# Patient Record
Sex: Male | Born: 1935 | Hispanic: No | Marital: Married | Smoking: Former smoker
Health system: Southern US, Community
[De-identification: ages and names within clinical notes are randomized; demographics above are authoritative.]

## PROBLEM LIST (undated history)

## (undated) DIAGNOSIS — E785 Hyperlipidemia, unspecified: Secondary | ICD-10-CM

## (undated) DIAGNOSIS — Z87442 Personal history of urinary calculi: Secondary | ICD-10-CM

## (undated) DIAGNOSIS — I10 Essential (primary) hypertension: Secondary | ICD-10-CM

## (undated) DIAGNOSIS — T884XXA Failed or difficult intubation, initial encounter: Secondary | ICD-10-CM

## (undated) DIAGNOSIS — I4891 Unspecified atrial fibrillation: Secondary | ICD-10-CM

## (undated) HISTORY — PX: CATARACT EXTRACTION, BILATERAL: SHX1313

## (undated) HISTORY — PX: TRACHEOSTOMY: SUR1362

---

## 2006-01-29 ENCOUNTER — Ambulatory Visit: Payer: Self-pay | Admitting: Internal Medicine

## 2006-02-22 ENCOUNTER — Ambulatory Visit: Payer: Self-pay | Admitting: Internal Medicine

## 2016-10-23 ENCOUNTER — Encounter (HOSPITAL_COMMUNITY)
Admission: EM | Disposition: A | Discharge status: Skilled Nursing Facility | Payer: Self-pay | Source: Home / Self Care | Attending: Neurology

## 2016-10-23 ENCOUNTER — Emergency Department (HOSPITAL_COMMUNITY): Payer: Medicare (Managed Care)

## 2016-10-23 ENCOUNTER — Inpatient Hospital Stay (HOSPITAL_COMMUNITY): Payer: Medicare (Managed Care)

## 2016-10-23 ENCOUNTER — Emergency Department (HOSPITAL_COMMUNITY): Payer: Medicare (Managed Care) | Admitting: Certified Registered Nurse Anesthetist

## 2016-10-23 ENCOUNTER — Encounter (HOSPITAL_COMMUNITY): Payer: Self-pay | Admitting: Emergency Medicine

## 2016-10-23 ENCOUNTER — Inpatient Hospital Stay (HOSPITAL_COMMUNITY)
Admission: EM | Admit: 2016-10-23 | Discharge: 2016-11-20 | DRG: 003 | Disposition: A | Discharge status: Skilled Nursing Facility | Payer: Medicare (Managed Care) | Attending: Internal Medicine | Admitting: Internal Medicine

## 2016-10-23 DIAGNOSIS — I4581 Long QT syndrome: Secondary | ICD-10-CM | POA: Diagnosis not present

## 2016-10-23 DIAGNOSIS — J95851 Ventilator associated pneumonia: Secondary | ICD-10-CM | POA: Diagnosis present

## 2016-10-23 DIAGNOSIS — R7303 Prediabetes: Secondary | ICD-10-CM | POA: Diagnosis not present

## 2016-10-23 DIAGNOSIS — R1310 Dysphagia, unspecified: Secondary | ICD-10-CM | POA: Diagnosis present

## 2016-10-23 DIAGNOSIS — J810 Acute pulmonary edema: Secondary | ICD-10-CM | POA: Diagnosis present

## 2016-10-23 DIAGNOSIS — Z781 Physical restraint status: Secondary | ICD-10-CM

## 2016-10-23 DIAGNOSIS — Z9119 Patient's noncompliance with other medical treatment and regimen: Secondary | ICD-10-CM | POA: Diagnosis not present

## 2016-10-23 DIAGNOSIS — I482 Chronic atrial fibrillation, unspecified: Secondary | ICD-10-CM

## 2016-10-23 DIAGNOSIS — Z9911 Dependence on respirator [ventilator] status: Secondary | ICD-10-CM

## 2016-10-23 DIAGNOSIS — Z01818 Encounter for other preprocedural examination: Secondary | ICD-10-CM | POA: Diagnosis not present

## 2016-10-23 DIAGNOSIS — L8990 Pressure ulcer of unspecified site, unspecified stage: Secondary | ICD-10-CM | POA: Clinically undetermined

## 2016-10-23 DIAGNOSIS — E7849 Other hyperlipidemia: Secondary | ICD-10-CM

## 2016-10-23 DIAGNOSIS — I63131 Cerebral infarction due to embolism of right carotid artery: Secondary | ICD-10-CM | POA: Diagnosis present

## 2016-10-23 DIAGNOSIS — J9601 Acute respiratory failure with hypoxia: Secondary | ICD-10-CM | POA: Diagnosis present

## 2016-10-23 DIAGNOSIS — Q251 Coarctation of aorta: Secondary | ICD-10-CM | POA: Diagnosis not present

## 2016-10-23 DIAGNOSIS — I10 Essential (primary) hypertension: Secondary | ICD-10-CM | POA: Diagnosis present

## 2016-10-23 DIAGNOSIS — D689 Coagulation defect, unspecified: Secondary | ICD-10-CM | POA: Diagnosis not present

## 2016-10-23 DIAGNOSIS — R609 Edema, unspecified: Secondary | ICD-10-CM | POA: Diagnosis not present

## 2016-10-23 DIAGNOSIS — I63311 Cerebral infarction due to thrombosis of right middle cerebral artery: Secondary | ICD-10-CM | POA: Diagnosis not present

## 2016-10-23 DIAGNOSIS — Z9289 Personal history of other medical treatment: Secondary | ICD-10-CM

## 2016-10-23 DIAGNOSIS — I6300 Cerebral infarction due to thrombosis of unspecified precerebral artery: Secondary | ICD-10-CM | POA: Diagnosis not present

## 2016-10-23 DIAGNOSIS — D62 Acute posthemorrhagic anemia: Secondary | ICD-10-CM | POA: Diagnosis present

## 2016-10-23 DIAGNOSIS — I6310 Cerebral infarction due to embolism of unspecified precerebral artery: Secondary | ICD-10-CM | POA: Diagnosis not present

## 2016-10-23 DIAGNOSIS — I6340 Cerebral infarction due to embolism of unspecified cerebral artery: Secondary | ICD-10-CM | POA: Diagnosis present

## 2016-10-23 DIAGNOSIS — R52 Pain, unspecified: Secondary | ICD-10-CM | POA: Diagnosis not present

## 2016-10-23 DIAGNOSIS — D696 Thrombocytopenia, unspecified: Secondary | ICD-10-CM | POA: Diagnosis present

## 2016-10-23 DIAGNOSIS — R1311 Dysphagia, oral phase: Secondary | ICD-10-CM | POA: Diagnosis not present

## 2016-10-23 DIAGNOSIS — Y95 Nosocomial condition: Secondary | ICD-10-CM | POA: Diagnosis not present

## 2016-10-23 DIAGNOSIS — K5900 Constipation, unspecified: Secondary | ICD-10-CM | POA: Diagnosis not present

## 2016-10-23 DIAGNOSIS — R34 Anuria and oliguria: Secondary | ICD-10-CM | POA: Diagnosis not present

## 2016-10-23 DIAGNOSIS — R001 Bradycardia, unspecified: Secondary | ICD-10-CM | POA: Diagnosis not present

## 2016-10-23 DIAGNOSIS — Z87891 Personal history of nicotine dependence: Secondary | ICD-10-CM

## 2016-10-23 DIAGNOSIS — E785 Hyperlipidemia, unspecified: Secondary | ICD-10-CM | POA: Diagnosis present

## 2016-10-23 DIAGNOSIS — Z430 Encounter for attention to tracheostomy: Secondary | ICD-10-CM | POA: Diagnosis not present

## 2016-10-23 DIAGNOSIS — Z23 Encounter for immunization: Secondary | ICD-10-CM

## 2016-10-23 DIAGNOSIS — R402414 Glasgow coma scale score 13-15, 24 hours or more after hospital admission: Secondary | ICD-10-CM | POA: Diagnosis not present

## 2016-10-23 DIAGNOSIS — J9509 Other tracheostomy complication: Secondary | ICD-10-CM

## 2016-10-23 DIAGNOSIS — E876 Hypokalemia: Secondary | ICD-10-CM | POA: Diagnosis present

## 2016-10-23 DIAGNOSIS — J189 Pneumonia, unspecified organism: Secondary | ICD-10-CM

## 2016-10-23 DIAGNOSIS — L03221 Cellulitis of neck: Secondary | ICD-10-CM | POA: Diagnosis not present

## 2016-10-23 DIAGNOSIS — Z515 Encounter for palliative care: Secondary | ICD-10-CM | POA: Diagnosis present

## 2016-10-23 DIAGNOSIS — I63011 Cerebral infarction due to thrombosis of right vertebral artery: Secondary | ICD-10-CM | POA: Diagnosis not present

## 2016-10-23 DIAGNOSIS — R9431 Abnormal electrocardiogram [ECG] [EKG]: Secondary | ICD-10-CM | POA: Diagnosis present

## 2016-10-23 DIAGNOSIS — G9340 Encephalopathy, unspecified: Secondary | ICD-10-CM | POA: Diagnosis not present

## 2016-10-23 DIAGNOSIS — Z7189 Other specified counseling: Secondary | ICD-10-CM

## 2016-10-23 DIAGNOSIS — R791 Abnormal coagulation profile: Secondary | ICD-10-CM | POA: Diagnosis not present

## 2016-10-23 DIAGNOSIS — I639 Cerebral infarction, unspecified: Secondary | ICD-10-CM

## 2016-10-23 DIAGNOSIS — R0600 Dyspnea, unspecified: Secondary | ICD-10-CM

## 2016-10-23 DIAGNOSIS — I63411 Cerebral infarction due to embolism of right middle cerebral artery: Principal | ICD-10-CM | POA: Diagnosis present

## 2016-10-23 DIAGNOSIS — I63349 Cerebral infarction due to thrombosis of unspecified cerebellar artery: Secondary | ICD-10-CM

## 2016-10-23 DIAGNOSIS — G8194 Hemiplegia, unspecified affecting left nondominant side: Secondary | ICD-10-CM | POA: Diagnosis present

## 2016-10-23 DIAGNOSIS — R509 Fever, unspecified: Secondary | ICD-10-CM

## 2016-10-23 DIAGNOSIS — R4701 Aphasia: Secondary | ICD-10-CM | POA: Diagnosis present

## 2016-10-23 DIAGNOSIS — R1313 Dysphagia, pharyngeal phase: Secondary | ICD-10-CM | POA: Diagnosis not present

## 2016-10-23 DIAGNOSIS — T45515A Adverse effect of anticoagulants, initial encounter: Secondary | ICD-10-CM | POA: Diagnosis not present

## 2016-10-23 DIAGNOSIS — J9690 Respiratory failure, unspecified, unspecified whether with hypoxia or hypercapnia: Secondary | ICD-10-CM

## 2016-10-23 DIAGNOSIS — R58 Hemorrhage, not elsewhere classified: Secondary | ICD-10-CM | POA: Diagnosis not present

## 2016-10-23 DIAGNOSIS — L03818 Cellulitis of other sites: Secondary | ICD-10-CM | POA: Diagnosis not present

## 2016-10-23 DIAGNOSIS — R29721 NIHSS score 21: Secondary | ICD-10-CM | POA: Diagnosis present

## 2016-10-23 DIAGNOSIS — Z79899 Other long term (current) drug therapy: Secondary | ICD-10-CM

## 2016-10-23 DIAGNOSIS — I63231 Cerebral infarction due to unspecified occlusion or stenosis of right carotid arteries: Secondary | ICD-10-CM | POA: Diagnosis not present

## 2016-10-23 DIAGNOSIS — L89152 Pressure ulcer of sacral region, stage 2: Secondary | ICD-10-CM | POA: Diagnosis not present

## 2016-10-23 DIAGNOSIS — E784 Other hyperlipidemia: Secondary | ICD-10-CM | POA: Diagnosis not present

## 2016-10-23 HISTORY — DX: Personal history of urinary calculi: Z87.442

## 2016-10-23 HISTORY — PX: IR GENERIC HISTORICAL: IMG1180011

## 2016-10-23 HISTORY — DX: Hyperlipidemia, unspecified: E78.5

## 2016-10-23 HISTORY — DX: Failed or difficult intubation, initial encounter: T88.4XXA

## 2016-10-23 HISTORY — PX: RADIOLOGY WITH ANESTHESIA: SHX6223

## 2016-10-23 HISTORY — DX: Essential (primary) hypertension: I10

## 2016-10-23 HISTORY — DX: Unspecified atrial fibrillation: I48.91

## 2016-10-23 LAB — BLOOD GAS, ARTERIAL
Acid-base deficit: 1.4 mmol/L (ref 0.0–2.0)
Bicarbonate: 21.5 mmol/L (ref 20.0–28.0)
Drawn by: 35849
FIO2: 50
MECHVT: 610 mL
O2 Saturation: 99.1 %
PEEP: 5 cmH2O
Patient temperature: 98.6
RATE: 18 {breaths}/min
pCO2 arterial: 28.2 mmHg — ABNORMAL LOW (ref 32.0–48.0)
pH, Arterial: 7.493 — ABNORMAL HIGH (ref 7.350–7.450)
pO2, Arterial: 174 mmHg — ABNORMAL HIGH (ref 83.0–108.0)

## 2016-10-23 LAB — COMPREHENSIVE METABOLIC PANEL
ALK PHOS: 49 U/L (ref 38–126)
ALT: 13 U/L — AB (ref 17–63)
AST: 23 U/L (ref 15–41)
Albumin: 3.6 g/dL (ref 3.5–5.0)
Anion gap: 9 (ref 5–15)
BUN: 14 mg/dL (ref 6–20)
CALCIUM: 8.8 mg/dL — AB (ref 8.9–10.3)
CO2: 23 mmol/L (ref 22–32)
CREATININE: 0.67 mg/dL (ref 0.61–1.24)
Chloride: 109 mmol/L (ref 101–111)
Glucose, Bld: 121 mg/dL — ABNORMAL HIGH (ref 65–99)
Potassium: 3.1 mmol/L — ABNORMAL LOW (ref 3.5–5.1)
Sodium: 141 mmol/L (ref 135–145)
Total Bilirubin: 0.5 mg/dL (ref 0.3–1.2)
Total Protein: 7.2 g/dL (ref 6.5–8.1)

## 2016-10-23 LAB — CBC
HEMATOCRIT: 40.1 % (ref 39.0–52.0)
HEMOGLOBIN: 13.4 g/dL (ref 13.0–17.0)
MCH: 29.1 pg (ref 26.0–34.0)
MCHC: 33.4 g/dL (ref 30.0–36.0)
MCV: 87 fL (ref 78.0–100.0)
Platelets: 114 10*3/uL — ABNORMAL LOW (ref 150–400)
RBC: 4.61 MIL/uL (ref 4.22–5.81)
RDW: 13.7 % (ref 11.5–15.5)
WBC: 6.1 10*3/uL (ref 4.0–10.5)

## 2016-10-23 LAB — MRSA PCR SCREENING: MRSA BY PCR: NEGATIVE

## 2016-10-23 LAB — I-STAT CHEM 8, ED
BUN: 18 mg/dL (ref 6–20)
CREATININE: 0.6 mg/dL — AB (ref 0.61–1.24)
Calcium, Ion: 1.03 mmol/L — ABNORMAL LOW (ref 1.15–1.40)
Chloride: 106 mmol/L (ref 101–111)
GLUCOSE: 121 mg/dL — AB (ref 65–99)
HCT: 40 % (ref 39.0–52.0)
HEMOGLOBIN: 13.6 g/dL (ref 13.0–17.0)
POTASSIUM: 3.4 mmol/L — AB (ref 3.5–5.1)
Sodium: 144 mmol/L (ref 135–145)
TCO2: 25 mmol/L (ref 0–100)

## 2016-10-23 LAB — URINALYSIS, ROUTINE W REFLEX MICROSCOPIC
Bilirubin Urine: NEGATIVE
Glucose, UA: NEGATIVE mg/dL
Hgb urine dipstick: NEGATIVE
KETONES UR: NEGATIVE mg/dL
LEUKOCYTES UA: NEGATIVE
NITRITE: NEGATIVE
PH: 7.5 (ref 5.0–8.0)
Protein, ur: NEGATIVE mg/dL
SPECIFIC GRAVITY, URINE: 1.039 — AB (ref 1.005–1.030)

## 2016-10-23 LAB — POCT I-STAT 4, (NA,K, GLUC, HGB,HCT)
GLUCOSE: 150 mg/dL — AB (ref 65–99)
Glucose, Bld: 131 mg/dL — ABNORMAL HIGH (ref 65–99)
HCT: 30 % — ABNORMAL LOW (ref 39.0–52.0)
HEMATOCRIT: 32 % — AB (ref 39.0–52.0)
HEMOGLOBIN: 10.2 g/dL — AB (ref 13.0–17.0)
Hemoglobin: 10.9 g/dL — ABNORMAL LOW (ref 13.0–17.0)
POTASSIUM: 2.6 mmol/L — AB (ref 3.5–5.1)
POTASSIUM: 3.2 mmol/L — AB (ref 3.5–5.1)
SODIUM: 145 mmol/L (ref 135–145)
Sodium: 145 mmol/L (ref 135–145)

## 2016-10-23 LAB — I-STAT TROPONIN, ED: TROPONIN I, POC: 0.01 ng/mL (ref 0.00–0.08)

## 2016-10-23 LAB — GLUCOSE, CAPILLARY
GLUCOSE-CAPILLARY: 121 mg/dL — AB (ref 65–99)
GLUCOSE-CAPILLARY: 123 mg/dL — AB (ref 65–99)
GLUCOSE-CAPILLARY: 89 mg/dL (ref 65–99)

## 2016-10-23 LAB — APTT: aPTT: 36 seconds (ref 24–36)

## 2016-10-23 LAB — RAPID URINE DRUG SCREEN, HOSP PERFORMED
Amphetamines: NOT DETECTED
BARBITURATES: NOT DETECTED
BENZODIAZEPINES: NOT DETECTED
COCAINE: NOT DETECTED
OPIATES: NOT DETECTED
TETRAHYDROCANNABINOL: NOT DETECTED

## 2016-10-23 LAB — DIFFERENTIAL
Basophils Absolute: 0.1 10*3/uL (ref 0.0–0.1)
Basophils Relative: 1 %
Eosinophils Absolute: 0.2 10*3/uL (ref 0.0–0.7)
Eosinophils Relative: 3 %
LYMPHS PCT: 29 %
Lymphs Abs: 1.8 10*3/uL (ref 0.7–4.0)
MONO ABS: 0.5 10*3/uL (ref 0.1–1.0)
MONOS PCT: 7 %
NEUTROS ABS: 3.6 10*3/uL (ref 1.7–7.7)
Neutrophils Relative %: 60 %

## 2016-10-23 LAB — CBG MONITORING, ED: Glucose-Capillary: 119 mg/dL — ABNORMAL HIGH (ref 65–99)

## 2016-10-23 LAB — PROTIME-INR
INR: 1.14
Prothrombin Time: 14.7 seconds (ref 11.4–15.2)

## 2016-10-23 LAB — TRIGLYCERIDES: Triglycerides: 141 mg/dL

## 2016-10-23 SURGERY — RADIOLOGY WITH ANESTHESIA
Anesthesia: General

## 2016-10-23 MED ORDER — FENTANYL CITRATE (PF) 100 MCG/2ML IJ SOLN
100.0000 ug | INTRAMUSCULAR | Status: AC | PRN
  Administered 2016-10-25 – 2016-10-28 (×3): 100 ug via INTRAVENOUS
  Filled 2016-10-23 (×3): qty 2

## 2016-10-23 MED ORDER — ACETAMINOPHEN 500 MG PO TABS: 1000.0000 mg | ORAL_TABLET | Freq: Four times a day (QID) | ORAL | Status: DC | PRN

## 2016-10-23 MED ORDER — SUCCINYLCHOLINE CHLORIDE 20 MG/ML IJ SOLN
INTRAMUSCULAR | Status: DC | PRN
  Administered 2016-10-23: 100 mg via INTRAVENOUS

## 2016-10-23 MED ORDER — HALOPERIDOL LACTATE 5 MG/ML IJ SOLN
1.0000 mg | Freq: Once | INTRAMUSCULAR | Status: AC
  Administered 2016-10-23: 1 mg via INTRAVENOUS

## 2016-10-23 MED ORDER — LABETALOL HCL 5 MG/ML IV SOLN
10.0000 mg | Freq: Once | INTRAVENOUS | Status: AC
  Administered 2016-10-23: 10 mg via INTRAVENOUS

## 2016-10-23 MED ORDER — SENNOSIDES-DOCUSATE SODIUM 8.6-50 MG PO TABS
1.0000 | ORAL_TABLET | Freq: Every evening | ORAL | Status: AC | PRN
  Filled 2016-10-23: qty 1

## 2016-10-23 MED ORDER — IOPAMIDOL (ISOVUE-370) INJECTION 76%
INTRAVENOUS | Status: AC
  Administered 2016-10-23: 50 mL
  Filled 2016-10-23: qty 50

## 2016-10-23 MED ORDER — LACTATED RINGERS IV SOLN
INTRAVENOUS | Status: DC | PRN
  Administered 2016-10-23: 09:00:00 via INTRAVENOUS

## 2016-10-23 MED ORDER — ORAL CARE MOUTH RINSE
15.0000 mL | Freq: Four times a day (QID) | OROMUCOSAL | Status: DC
  Administered 2016-10-23 – 2016-10-25 (×8): 15 mL via OROMUCOSAL

## 2016-10-23 MED ORDER — SODIUM CHLORIDE 0.9 % IV SOLN
INTRAVENOUS | Status: DC
  Administered 2016-10-25 – 2016-10-30 (×7): via INTRAVENOUS

## 2016-10-23 MED ORDER — ROCURONIUM BROMIDE 100 MG/10ML IV SOLN
INTRAVENOUS | Status: DC | PRN
  Administered 2016-10-23: 50 mg via INTRAVENOUS
  Administered 2016-10-23: 70 mg via INTRAVENOUS
  Administered 2016-10-23: 30 mg via INTRAVENOUS

## 2016-10-23 MED ORDER — GLYCOPYRROLATE 0.2 MG/ML IJ SOLN
INTRAMUSCULAR | Status: DC | PRN
  Administered 2016-10-23: .02 mg via INTRAVENOUS

## 2016-10-23 MED ORDER — ONDANSETRON HCL 4 MG/2ML IJ SOLN: 4.0000 mg | Freq: Four times a day (QID) | INTRAMUSCULAR | Status: DC | PRN

## 2016-10-23 MED ORDER — NICARDIPINE HCL IN NACL 20-0.86 MG/200ML-% IV SOLN: 5.0000 mg/h | INTRAVENOUS | Status: DC

## 2016-10-23 MED ORDER — STROKE: EARLY STAGES OF RECOVERY BOOK
Freq: Once | Status: AC
  Filled 2016-10-23: qty 1

## 2016-10-23 MED ORDER — FAMOTIDINE IN NACL 20-0.9 MG/50ML-% IV SOLN
20.0000 mg | INTRAVENOUS | Status: DC
  Administered 2016-10-23 – 2016-11-01 (×10): 20 mg via INTRAVENOUS
  Filled 2016-10-23 (×10): qty 50

## 2016-10-23 MED ORDER — CEFAZOLIN SODIUM-DEXTROSE 2-4 GM/100ML-% IV SOLN
INTRAVENOUS | Status: AC
  Filled 2016-10-23: qty 100

## 2016-10-23 MED ORDER — LABETALOL HCL 5 MG/ML IV SOLN
5.0000 mg | INTRAVENOUS | Status: DC | PRN
  Administered 2016-10-24: 5 mg via INTRAVENOUS
  Filled 2016-10-23: qty 4

## 2016-10-23 MED ORDER — POTASSIUM CHLORIDE 10 MEQ/100ML IV SOLN
INTRAVENOUS | Status: DC | PRN
  Administered 2016-10-23 (×2): 10 meq via INTRAVENOUS

## 2016-10-23 MED ORDER — INSULIN ASPART 100 UNIT/ML ~~LOC~~ SOLN
2.0000 [IU] | SUBCUTANEOUS | Status: DC
  Administered 2016-10-23 – 2016-10-26 (×9): 2 [IU] via SUBCUTANEOUS
  Administered 2016-10-26: 4 [IU] via SUBCUTANEOUS
  Administered 2016-10-26 (×2): 2 [IU] via SUBCUTANEOUS
  Administered 2016-10-26: 4 [IU] via SUBCUTANEOUS
  Administered 2016-10-26 – 2016-10-27 (×2): 2 [IU] via SUBCUTANEOUS
  Administered 2016-10-27: 4 [IU] via SUBCUTANEOUS
  Administered 2016-10-27: 2 [IU] via SUBCUTANEOUS
  Administered 2016-10-27 (×3): 4 [IU] via SUBCUTANEOUS
  Administered 2016-10-28: 2 [IU] via SUBCUTANEOUS
  Administered 2016-10-28 (×4): 4 [IU] via SUBCUTANEOUS
  Administered 2016-10-29: 2 [IU] via SUBCUTANEOUS
  Administered 2016-10-29 – 2016-10-30 (×7): 4 [IU] via SUBCUTANEOUS
  Administered 2016-10-30: 2 [IU] via SUBCUTANEOUS
  Administered 2016-10-31 – 2016-11-03 (×22): 4 [IU] via SUBCUTANEOUS
  Administered 2016-11-03: 2 [IU] via SUBCUTANEOUS

## 2016-10-23 MED ORDER — SODIUM CHLORIDE 0.9 % IV SOLN: INTRAVENOUS | Status: DC

## 2016-10-23 MED ORDER — PROPOFOL 1000 MG/100ML IV EMUL
0.0000 ug/kg/min | INTRAVENOUS | Status: DC
Start: 2016-10-23 — End: 2016-10-25
  Administered 2016-10-24: 30 ug/kg/min via INTRAVENOUS
  Administered 2016-10-24 – 2016-10-25 (×3): 20 ug/kg/min via INTRAVENOUS
  Filled 2016-10-23 (×2): qty 100
  Filled 2016-10-23: qty 200
  Filled 2016-10-23 (×2): qty 100

## 2016-10-23 MED ORDER — FENTANYL CITRATE (PF) 100 MCG/2ML IJ SOLN
INTRAMUSCULAR | Status: DC | PRN
  Administered 2016-10-23: 50 ug via INTRAVENOUS
  Administered 2016-10-23: 100 ug via INTRAVENOUS
  Administered 2016-10-23: 50 ug via INTRAVENOUS
  Administered 2016-10-23: 100 ug via INTRAVENOUS

## 2016-10-23 MED ORDER — EPTIFIBATIDE 20 MG/10ML IV SOLN
INTRAVENOUS | Status: AC
  Filled 2016-10-23: qty 10

## 2016-10-23 MED ORDER — ASPIRIN 325 MG PO TABS
ORAL_TABLET | ORAL | Status: AC
Start: 2016-10-23 — End: 2016-10-23
  Filled 2016-10-23: qty 2

## 2016-10-23 MED ORDER — PROPOFOL 10 MG/ML IV BOLUS
INTRAVENOUS | Status: DC | PRN
Start: 2016-10-23 — End: 2016-10-23
  Administered 2016-10-23: 140 mg via INTRAVENOUS

## 2016-10-23 MED ORDER — PROPOFOL 10 MG/ML IV BOLUS
INTRAVENOUS | Status: AC
  Filled 2016-10-23: qty 20

## 2016-10-23 MED ORDER — IOPAMIDOL (ISOVUE-300) INJECTION 61%
INTRAVENOUS | Status: AC
  Administered 2016-10-23: 75 mL
  Filled 2016-10-23: qty 150

## 2016-10-23 MED ORDER — HALOPERIDOL LACTATE 5 MG/ML IJ SOLN
INTRAMUSCULAR | Status: AC
  Filled 2016-10-23: qty 1

## 2016-10-23 MED ORDER — LORAZEPAM 2 MG/ML IJ SOLN
INTRAMUSCULAR | Status: AC
  Filled 2016-10-23: qty 1

## 2016-10-23 MED ORDER — EPTIFIBATIDE 20 MG/10ML IV SOLN
INTRAVENOUS | Status: AC | PRN
  Administered 2016-10-23 (×4): 1.8 mg via INTRAVENOUS

## 2016-10-23 MED ORDER — PHENYLEPHRINE HCL 10 MG/ML IJ SOLN
INTRAVENOUS | Status: DC | PRN
  Administered 2016-10-23: 10 ug/min via INTRAVENOUS

## 2016-10-23 MED ORDER — FENTANYL CITRATE (PF) 100 MCG/2ML IJ SOLN
INTRAMUSCULAR | Status: AC
  Filled 2016-10-23: qty 6

## 2016-10-23 MED ORDER — CEFAZOLIN SODIUM-DEXTROSE 2-3 GM-% IV SOLR
INTRAVENOUS | Status: DC | PRN
  Administered 2016-10-23 (×2): 2 g via INTRAVENOUS

## 2016-10-23 MED ORDER — LORAZEPAM 2 MG/ML IJ SOLN
1.0000 mg | Freq: Once | INTRAMUSCULAR | Status: AC
  Administered 2016-10-23: 1 mg via INTRAVENOUS

## 2016-10-23 MED ORDER — FENTANYL CITRATE (PF) 100 MCG/2ML IJ SOLN
100.0000 ug | INTRAMUSCULAR | Status: DC | PRN
  Administered 2016-10-25 – 2016-10-28 (×7): 100 ug via INTRAVENOUS
  Administered 2016-10-29: 50 ug via INTRAVENOUS
  Administered 2016-10-29 – 2016-10-31 (×7): 100 ug via INTRAVENOUS
  Filled 2016-10-23 (×15): qty 2

## 2016-10-23 MED ORDER — CHLORHEXIDINE GLUCONATE 0.12% ORAL RINSE (MEDLINE KIT)
15.0000 mL | Freq: Two times a day (BID) | OROMUCOSAL | Status: DC
  Administered 2016-10-23 – 2016-10-25 (×4): 15 mL via OROMUCOSAL

## 2016-10-23 MED ORDER — NITROGLYCERIN 1 MG/10 ML FOR IR/CATH LAB
INTRA_ARTERIAL | Status: AC
Start: 2016-10-23 — End: 2016-10-23
  Filled 2016-10-23: qty 10

## 2016-10-23 MED ORDER — CLOPIDOGREL BISULFATE 300 MG PO TABS
ORAL_TABLET | ORAL | Status: AC
  Filled 2016-10-23: qty 1

## 2016-10-23 MED ORDER — SODIUM CHLORIDE 0.9 % IV SOLN
INTRAVENOUS | Status: DC | PRN
  Administered 2016-10-23 (×2): via INTRAVENOUS

## 2016-10-23 MED ORDER — POTASSIUM CHLORIDE 20 MEQ/15ML (10%) PO SOLN
40.0000 meq | Freq: Once | ORAL | Status: AC
  Administered 2016-10-23: 40 meq via ORAL
  Filled 2016-10-23: qty 30

## 2016-10-23 MED ORDER — IOPAMIDOL (ISOVUE-370) INJECTION 76%
INTRAVENOUS | Status: AC
  Filled 2016-10-23: qty 50

## 2016-10-23 MED ORDER — ARTIFICIAL TEARS OP OINT
TOPICAL_OINTMENT | OPHTHALMIC | Status: DC | PRN
  Administered 2016-10-23: 1 via OPHTHALMIC

## 2016-10-23 MED ORDER — IOPAMIDOL (ISOVUE-300) INJECTION 61%
INTRAVENOUS | Status: AC
Start: 2016-10-23 — End: 2016-10-23
  Administered 2016-10-23: 70 mL
  Filled 2016-10-23: qty 300

## 2016-10-23 MED ORDER — SODIUM CHLORIDE 0.9 % IJ SOLN
INTRAVENOUS | Status: AC | PRN
  Administered 2016-10-23 (×3): 25 ug via INTRA_ARTERIAL

## 2016-10-23 MED ORDER — ACETAMINOPHEN 650 MG RE SUPP: 650.0000 mg | Freq: Four times a day (QID) | RECTAL | Status: DC | PRN

## 2016-10-23 NOTE — Progress Notes (Signed)
SLP Cancellation Note  Patient Details Name: Luis AbedKong Bandel MRN: [id number] DOB: 14-Apr-1926   Cancelled treatment:       Reason Eval/Treat Not Completed: Patient not medically ready. Please reconsult when pt able to participate with speech/swallow assessments.    Rocky CraftsKara E Miranda Garber MA, CCC-SLP Pager [phone] 10/23/2016, 3:04 PM

## 2016-10-23 NOTE — Anesthesia Preprocedure Evaluation (Addendum)
Anesthesia Evaluation  Patient identified by MRN, date of birth, ID band Patient confused  Preop documentation limited or incomplete due to emergent nature of procedure.  Airway Mallampati: II  TM Distance: >3 FB     Dental  (+) Edentulous Upper   Pulmonary    Pulmonary exam normal        Cardiovascular  Rate:Tachycardia     Neuro/Psych    GI/Hepatic   Endo/Other    Renal/GU      Musculoskeletal   Abdominal   Peds  Hematology   Anesthesia Other Findings   Reproductive/Obstetrics                             Anesthesia Physical Anesthesia Plan  ASA: IV and emergent  Anesthesia Plan: General   Post-op Pain Management:    Induction: Intravenous, Rapid sequence and Cricoid pressure planned  Airway Management Planned: Oral ETT  Additional Equipment:   Intra-op Plan:   Post-operative Plan: Post-operative intubation/ventilation  Informed Consent: I have reviewed the patients History and Physical, chart, labs and discussed the procedure including the risks, benefits and alternatives for the proposed anesthesia with the patient or authorized representative who has indicated his/her understanding and acceptance.   Only emergency history available and History available from chart only  Plan Discussed with: CRNA, Anesthesiologist and Surgeon  Anesthesia Plan Comments:        Anesthesia Quick Evaluation

## 2016-10-23 NOTE — Anesthesia Procedure Notes (Signed)
Procedure Name: Intubation Date/Time: 10/23/2016 7:00 AM Performed by: Julianne RiceBILOTTA, Rheda Kassab Z Pre-anesthesia Checklist: Patient identified, Emergency Drugs available, Suction available, Patient being monitored and Timeout performed Patient Re-evaluated:Patient Re-evaluated prior to inductionOxygen Delivery Method: Circle system utilized Preoxygenation: Pre-oxygenation with 100% oxygen Intubation Type: IV induction, Rapid sequence and Cricoid Pressure applied Laryngoscope Size: Glidescope Grade View: Grade I Tube type: Subglottic suction tube Tube size: 7.5 mm Number of attempts: 1 Airway Equipment and Method: Stylet and Video-laryngoscopy Placement Confirmation: ETT inserted through vocal cords under direct vision,  positive ETCO2 and breath sounds checked- equal and bilateral Secured at: 22 cm Tube secured with: Tape Dental Injury: Teeth and Oropharynx as per pre-operative assessment

## 2016-10-23 NOTE — Progress Notes (Signed)
Patient transported from PACU to room 3M04 without any apparent complications. RT will continue to monitor.

## 2016-10-23 NOTE — Progress Notes (Signed)
STROKE TEAM PROGRESS NOTE   HISTORY OF PRESENT ILLNESS (per record) Sean [name] is a non-English speaking 82 y.o. male who was last seen normal at midnight. He was found on the floor by family at 3:10 AM and was noted to be paralyzed on the left and unable to speak clearly. Code Stroke was initiated in the field and he was emergently transported to our ED. Not hypoglycemic per EMS. Per family he takes dabigatran for atrial fibrillation. They are unsure if he has HTN, but one of his medications is lisinopril. Also unsure if he has high cholesterol although he takes niacin. He has never had a stroke or a heart attack and does not have diabetes. He has a remote smoking history.    Family states that the patient missed both doses of Pradaxa yesterday and one of his doses the day before yesterday.   LSN: Midnight on 11/10 tPA Given: No, due to use of dabigatran   SUBJECTIVE (INTERVAL HISTORY) No family is at the bedside today. I met his wife and son yesterday at bedside. Pt has HTN and HLD and taking lisinopril-HCTZ and niacin at home along with pradaxa. However, he did not take pradaxa one day PTA. Son said he just refused to take that day.  Sheath removed today. He still intubated and on sedation. Left hemiparesis, especially the left UE. MRI showed right MCA moderate sized infarct. Put on ASA and heparin subq.    OBJECTIVE Temp:  [97.2 F (36.2 C)-99.8 F (37.7 C)] 99.8 F (37.7 C) (11/11 0800) Pulse Rate:  [40-134] 67 (11/11 0815) Cardiac Rhythm: Atrial fibrillation (11/10 2000) Resp:  [13-18] 18 (11/11 0815) BP: (101-162)/(43-78) 160/78 (11/11 0815) SpO2:  [97 %-100 %] 100 % (11/11 0815) Arterial Line BP: (111-183)/(49-111) 183/79 (11/11 0800) FiO2 (%):  [40 %-50 %] 40 % (11/11 0343) Weight:  [83 kg (182 lb 15.7 oz)] 83 kg (182 lb 15.7 oz) (11/10 1445)  CBC:  Recent Labs Lab 10/23/16 0345  10/23/16 1051 10/24/16 0500  WBC 6.1  --   --  6.5  NEUTROABS 3.6  --   --  4.7  HGB  13.4  < > 10.2* 9.7*  HCT 40.1  < > 30.0* 29.7*  MCV 87.0  --   --  86.3  PLT 114*  --   --  109*  < > = values in this interval not displayed.  Basic Metabolic Panel:  Recent Labs Lab 10/23/16 0345 10/23/16 0352  10/23/16 1051 10/24/16 0500  NA 141 144  < > 145 141  K 3.1* 3.4*  < > 3.2* 2.9*  CL 109 106  --   --  113*  CO2 23  --   --   --  21*  GLUCOSE 121* 121*  < > 131* 118*  BUN 14 18  --   --  8  CREATININE 0.67 0.60*  --   --  0.68  CALCIUM 8.8*  --   --   --  7.8*  MG  --   --   --   --  1.7  PHOS  --   --   --   --  2.8  < > = values in this interval not displayed.  Lipid Panel:     Component Value Date/Time   TRIG 141 10/23/2016 2111   HgbA1c: No results found for: HGBA1C Urine Drug Screen:     Component Value Date/Time   LABOPIA NONE DETECTED 10/23/2016 0628   COCAINSCRNUR NONE DETECTED  10/23/2016 0628   LABBENZ NONE DETECTED 10/23/2016 0628   AMPHETMU NONE DETECTED 10/23/2016 0628   THCU NONE DETECTED 10/23/2016 0628   LABBARB NONE DETECTED 10/23/2016 0628      IMAGING I have personally reviewed the radiological images below and agree with the radiology interpretations.  Ct Angio Head and Neck W Or Wo Contrast 10/23/2016 1. Occlusion of the right internal carotid artery just distal to the carotid bifurcation  2. Severely diminished opacification of the right middle cerebral artery, in keeping with acute right MCA infarct and the reported clinical left-sided symptoms.  3. No hemodynamically significant stenosis of the left carotid or vertebral arteries.   Ct Cerebral Perfusion W Contrast 10/23/2016 Motion during the perfusion scan compromises the obtained data. The calculated mismatch volume of 193 mL is likely unreliable and is not expected to provide accurate assessment of the ischemic penumbra.   Ct Head Code Stroke W/o Cm 10/23/2016 1. No acute intracranial hemorrhage.  2. ASPECTS is 10.  No CT evidence of acute cortical infarct.   Cerebral  Angiogram S/P Rt common carotid ,lt common caroptid anr RT vert artery angiograms,follwed by complete revascularization of occluded RT ICA,RT MCA and RT ACA with x 4 passes with solitaire 56m x 40 mm retrieval device ,x 1pass with the 064 ACE aspiration catheter and 7.2 mg of INTEGRELIN   with a TICI 2B reperfusion.  MRI Head Wo Contrast 10/23/2016 1. Areas of acute infarction in the right MCA territory involving the insula, frontal operculum, anteromedial temporal lobe, and lateral temporal parietal junction. No evidence for hemorrhagic conversion. 2. Sequelae of small chronic hemorrhagic infarcts in the right cerebellum and central distribution chronic microhemorrhage likely hypertensive in etiology. 3. Advanced chronic microvascular ischemic changes and parenchymal volume loss.  TTE pending   PHYSICAL EXAM  Temp:  [97.2 F (36.2 C)-99.8 F (37.7 C)] 99.8 F (37.7 C) (11/11 0800) Pulse Rate:  [40-134] 67 (11/11 0815) Resp:  [13-18] 18 (11/11 0815) BP: (101-162)/(43-78) 160/78 (11/11 0815) SpO2:  [97 %-100 %] 100 % (11/11 0930) Arterial Line BP: (111-183)/(49-111) 183/79 (11/11 0800) FiO2 (%):  [40 %-50 %] 40 % (11/11 0930) Weight:  [182 lb 15.7 oz (83 kg)] 182 lb 15.7 oz (83 kg) (11/10 1445)  General - Well nourished, well developed, intubated and on sedation.  Ophthalmologic - Fundi not visualized due to noncooperation.  Cardiovascular - irregularly irregular heart rate and rhythm.  Neuro - intubated, on sedation, eyes closed, not following commands. Pupil unequal sizes due to bilateral cataract surgery in the past, left pupil irregular shape, 1 mm, slightly reacting to light. Right pupil 452m round shape, sluggish to light reaction, tonic pupil, down to 3.5 mm with constant light exposure. Facial asymmetry cannot be evaluated due to ET tube. On pain stimulation, left UE 1/5, LLE and RLE mild withdrawal, RUE localizing to pain. DTR 1+, no Babinski. Sensation, coordination, and  gait not tested.    ASSESSMENT/PLAN Mr. KoElvin Mccartins a [age].o. male with history of hypertension, hyperlipidemia, and atrial fibrillation on Pradaxa presenting with left hemiplegia and speech difficulties.. Marland Kitchene did not receive IV t-PA due to anticoagulation. Interventional Radiology for cerebral angiogram and intervention as above.  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  Resultant  Intubated, left hemiparesis  MRI - acute infarction in the right MCA territory   CTA H&N - acute right MCA infarct. Occlusion of the right ICA.  2D Echo - pending  LDL - pending  HgbA1c - pending  VTE prophylaxis - subcutaneous heparin Diet NPO time specified  Pradaxa (dabigatran) twice a day prior to admission, now on ASA. Hold off anticoagulation due to moderately large right MCA infarct.  Ongoing aggressive stroke risk factor management  Therapy recommendations:  Pending (on bedrest)  Disposition:  Pending  Right ICA occlusion  Likely due to A. fib noncompliance with Pradaxa  S/p mechanical thrombectomy  BP goal < 160  Chronic A. fib  On Pradaxa at home however missing doses one day PTA  Hold off Pradaxa due to moderately large right MCA infarct  Consider resume Pradaxa in one week and no procedure scheduled  Respiratory failure  Intubated on sedation  CCM on board  Wean off ventilation as able  Hypertension  Stable  BP goal < 160 due to recanalization of right ICA to avoid hyperperfusion syndrome  Avoid hypotension  Hyperlipidemia  Home meds:  Niacin - not resumed.  LDL pending, goal < 70  Other Stroke Risk Factors  Advanced age  Other Active Problems  Hypokalemia - supplemented 3.2 -> 2.9 -> supplement   S/p bilateral cataract surgery  Anemia - 9.7 / 29.7  Thrombocytopenia - [facility] day # 1  This patient is critically ill due to  large right MCA infarct, right ICA occlusion s/p mechanical thrombectomy, A. fib, hypertension and respiratory failure and at significant risk of neurological worsening, death form recurrent infarct, hemorrhagic transformation, hypertensive urgency, heart failure and cerebral edema. This patient's care requires constant monitoring of vital signs, hemodynamics, respiratory and cardiac monitoring, review of multiple databases, neurological assessment, discussion with family, other specialists and medical decision making of high complexity. I spent 40 minutes of neurocritical care time in the care of this patient.  Rosalin Hawking, MD PhD Stroke Neurology 10/24/2016 10:36 AM   To contact Stroke Continuity provider, please refer to [email or web address] After hours, contact General Neurology

## 2016-10-23 NOTE — Progress Notes (Signed)
Report given to [facility] [place] on [id number]

## 2016-10-23 NOTE — Progress Notes (Signed)
OT Cancellation Note  Patient Details Name: Sean [name] MRN: [id number] DOB: 09-18-1926   Cancelled Treatment:    Reason Eval/Treat Not Completed: Patient not medically ready (active bedrest orders).  Gaye AlkenBailey A Elenna Spratling M.S., OTR/L Pager: [phone]  10/23/2016, 3:08 PM

## 2016-10-23 NOTE — ED Notes (Signed)
Escorted patient and family to IR, bedside report given to IR team

## 2016-10-23 NOTE — ED Triage Notes (Signed)
Pt BIB EMS. Last seen well by family at midnight when they went to bed.  Approximately 0310 pt was found on the floor next to the bed and was unable to move himself back onto the bed.  Left side affected. PT was seen by a new PCP Thursday afternoon for a checkup.  PMH unclear.

## 2016-10-23 NOTE — Progress Notes (Signed)
Pt arrived to pacu with vent, Diprovan started at 1240 at 25 mcg per anesthesa, continued care per anesthesa

## 2016-10-23 NOTE — ED Provider Notes (Signed)
[facility] DEPT Provider Note   CSN: [id number] Arrival date & time: 10/23/16  8563     History   Chief Complaint Chief Complaint  Patient presents with  . Code Stroke    HPI Sean [name] is a 82 y.o. male.  HPI Patient presents by EMS as a code stroke. He has expressive aphasia and is unable to contribute history. Level V caveat applies. Per EMS patient was last seen normal at midnight when he went to bed. He was found in the floor by family shortly before their arrival. Not moving left side with right-sided gaze deviation. History reviewed. No pertinent past medical history.  Patient Active Problem List   Diagnosis Date Noted  . Cerebral embolism with cerebral infarction 10/23/2016  . Stroke (cerebrum) ([place]) 10/23/2016    History reviewed. No pertinent surgical history.     Home Medications    Prior to Admission medications   Not on File    Family History No family history on file.  Social History Social History  Substance Use Topics  . Smoking status: Not on file  . Smokeless tobacco: Not on file  . Alcohol use Not on file     Allergies   Patient has no known allergies.   Review of Systems Review of Systems  Unable to perform ROS: Patient nonverbal     Physical Exam Updated Vital Signs BP 136/75   Pulse 77   Temp 99 F (37.2 C) (Axillary)   Resp 16   Ht 6' (1.829 m)   Wt 182 lb 15.7 oz (83 kg)   SpO2 100%   BMI 24.82 kg/m   Physical Exam  Constitutional: He appears well-developed and well-nourished. He appears distressed.  Mildly agitated  HENT:  Head: Normocephalic and atraumatic.  Mouth/Throat: Oropharynx is clear and moist.  Eyes: EOM are normal. Pupils are equal, round, and reactive to light.  Right gaze preference  Neck: Normal range of motion. Neck supple.  No appreciated meningismus  Cardiovascular: Normal rate.   Irregularly irregular  Pulmonary/Chest: Effort normal and breath sounds normal. No respiratory distress.  He has no wheezes. He has no rales.  Abdominal: Soft. Bowel sounds are normal.  Musculoskeletal: Normal range of motion. He exhibits no edema or tenderness.  Neurological: He is alert.  Following simple commands. Unintelligible words. 1/5 motor and left upper and left lower extremities. 5/5 motor in right upper and lower extremities.  Skin: Skin is warm and dry. Capillary refill takes less than 2 seconds. No rash noted. No erythema.  Psychiatric: He has a normal mood and affect. His behavior is normal.  Nursing note and vitals reviewed.    ED Treatments / Results  Labs (all labs ordered are listed, but only abnormal results are displayed) Labs Reviewed  CBC - Abnormal; Notable for the following:       Result Value   Platelets 114 (*)    All other components within normal limits  COMPREHENSIVE METABOLIC PANEL - Abnormal; Notable for the following:    Potassium 3.1 (*)    Glucose, Bld 121 (*)    Calcium 8.8 (*)    ALT 13 (*)    All other components within normal limits  URINALYSIS, ROUTINE W REFLEX MICROSCOPIC (NOT AT [facility]) - Abnormal; Notable for the following:    Specific Gravity, Urine 1.039 (*)    All other components within normal limits  BLOOD GAS, ARTERIAL - Abnormal; Notable for the following:    pH, Arterial 7.493 (*)  pCO2 arterial 28.2 (*)    pO2, Arterial 174 (*)    All other components within normal limits  GLUCOSE, CAPILLARY - Abnormal; Notable for the following:    Glucose-Capillary 123 (*)    All other components within normal limits  BASIC METABOLIC PANEL - Abnormal; Notable for the following:    Potassium 2.9 (*)    Chloride 113 (*)    CO2 21 (*)    Glucose, Bld 118 (*)    Calcium 7.8 (*)    All other components within normal limits  CBC WITH DIFFERENTIAL/PLATELET - Abnormal; Notable for the following:    RBC 3.44 (*)    Hemoglobin 9.7 (*)    HCT 29.7 (*)    Platelets 109 (*)    All other components within normal limits  GLUCOSE, CAPILLARY -  Abnormal; Notable for the following:    Glucose-Capillary 121 (*)    All other components within normal limits  GLUCOSE, CAPILLARY - Abnormal; Notable for the following:    Glucose-Capillary 100 (*)    All other components within normal limits  CBG MONITORING, ED - Abnormal; Notable for the following:    Glucose-Capillary 119 (*)    All other components within normal limits  I-STAT CHEM 8, ED - Abnormal; Notable for the following:    Potassium 3.4 (*)    Creatinine, Ser 0.60 (*)    Glucose, Bld 121 (*)    Calcium, Ion 1.03 (*)    All other components within normal limits  POCT I-STAT 4, (NA,K, GLUC, HGB,HCT) - Abnormal; Notable for the following:    Potassium 2.6 (*)    Glucose, Bld 150 (*)    HCT 32.0 (*)    Hemoglobin 10.9 (*)    All other components within normal limits  POCT I-STAT 4, (NA,K, GLUC, HGB,HCT) - Abnormal; Notable for the following:    Potassium 3.2 (*)    Glucose, Bld 131 (*)    HCT 30.0 (*)    Hemoglobin 10.2 (*)    All other components within normal limits  MRSA PCR SCREENING  PROTIME-INR  APTT  DIFFERENTIAL  RAPID URINE DRUG SCREEN, HOSP PERFORMED  TRIGLYCERIDES  MAGNESIUM  PHOSPHORUS  GLUCOSE, CAPILLARY  HEMOGLOBIN A1C  LIPID PANEL  CBC  I-STAT TROPOININ, ED    EKG  EKG Interpretation  Date/Time:  Friday October 23 2016 04:42:37 EST Ventricular Rate:  68 PR Interval:    QRS Duration: 102 QT Interval:  445 QTC Calculation: 474 R Axis:   81 Text Interpretation:  Atrial fibrillation Anterior infarct, old Nonspecific repol abnormality, lateral leads Baseline wander in lead(s) V1 Confirmed by Lita Mains  MD, Makenah Karas ([id number]) on 10/23/2016 5:02:26 AM       Radiology Ct Angio Head W Or Wo Contrast  Result Date: 10/23/2016 CLINICAL DATA:  Code stroke.  Left -sided weakness. EXAM: CT ANGIOGRAPHY HEAD AND NECK TECHNIQUE: Multidetector CT imaging of the head and neck was performed using the standard protocol during bolus administration of  intravenous contrast. Multiplanar CT image reconstructions and MIPs were obtained to evaluate the vascular anatomy. Carotid stenosis measurements (when applicable) are obtained utilizing NASCET criteria, using the distal internal carotid diameter as the denominator. CONTRAST:  50 mL Isovue 370 COMPARISON:  None. FINDINGS: CTA NECK FINDINGS Aortic arch: There is atherosclerotic calcification within the aortic arch. The proximal subclavian arteries are normal. No aneurysm or dissection of the aorta. Right carotid system: The right carotid origin is normal. There is mild atherosclerotic plaque within the right common  carotid artery. Within the distal right common carotid artery. There are multiple areas of somewhat reduced attenuation. There is central thrombus within the proximal right ICA at the bifurcation with complete occlusion of the right ICA approximately 1.3 cm above the bifurcation. The remainder of the cervical right ICA is occluded. Left carotid system: There is mild multifocal atherosclerotic calcification of the left common carotid artery. No hemodynamically significant stenosis of the left ICA. Vertebral arteries: There is atherosclerotic calcification within the V4 segments of both vertebral arteries with less than 50% stenosis. The vertebral artery origins are normal. The course and caliber of the cervical segments of the vertebral arteries are normal. Skeleton: There is no bony spinal canal stenosis. No lytic or blastic lesions. Other neck: The nasopharynx is clear. The oropharynx and hypopharynx are normal. The epiglottis is normal. The supraglottic larynx, glottis and subglottic larynx are normal. No retropharyngeal collection. The parapharyngeal spaces are preserved. The parotid and submandibular glands are normal. No sialolithiasis or salivary ductal dilatation. The thyroid gland is normal. There is no cervical lymphadenopathy. Upper chest: No pulmonary nodules or masses. No pleural effusion or  focal consolidation. Review of the MIP images confirms the above findings CTA HEAD FINDINGS There is motion degradation of the intracranial images. Anterior circulation: --Intracranial internal carotid arteries: The right ICA is occluded at the bifurcation and there is no opacification of the intracranial right ICA. There is atherosclerotic calcification of the left ICA at the skullbase. --Anterior cerebral arteries: Normal. --Middle cerebral arteries: There is severely diminished opacification of the right MCA. Lead opacification there is is probably secondary to collateral flow across the anterior communicating artery. --Posterior communicating arteries: Present on the left. Not seen on the right. Posterior circulation: --Posterior cerebral arteries: There is a fetal origin of the left PCA. Right PCA is normal. --Superior cerebellar arteries: Normal. --Basilar artery: Normal. --Anterior inferior cerebellar arteries: Normal. --Posterior inferior cerebellar arteries: Normal. Venous sinuses: As permitted by contrast timing, patent. Anatomic variants: Fetal origin of the left PCA. Delayed phase: Not performed Review of the MIP images confirms the above findings IMPRESSION: 1. Occlusion of the right internal carotid artery just distal to the carotid bifurcation 2. Severely diminished opacification of the right middle cerebral artery, in keeping with acute right MCA infarct and the reported clinical left-sided symptoms. 3. No hemodynamically significant stenosis of the left carotid or vertebral arteries. Critical Value/emergent results were called by telephone at the time of interpretation on 10/23/2016 at 4:57 am to Dr. Kerney Elbe, who verbally acknowledged these results. Electronically Signed   By: Ulyses Jarred M.D.   On: 10/23/2016 05:07   Ct Head Wo Contrast  Result Date: 10/23/2016 CLINICAL DATA:  Stroke. Post revascularization of occluded right internal carotid artery. EXAM: CT HEAD WITHOUT CONTRAST  TECHNIQUE: Contiguous axial images were obtained from the base of the skull through the vertex without intravenous contrast. COMPARISON:  CT head 10/23/2016 FINDINGS: Brain: Advanced atrophy. Chronic microvascular ischemic change in the white matter. No new area of acute infarct since the preprocedure CT Ill-defined hyperdensity in the right hippocampus and posterior limb internal capsule. Similar area of ill-defined hyperdensity in the right superior cerebellum. These may be areas of hemorrhage or contrast staining related to angiography and/or infarction. CT follow-up recommended. No shift of the midline structures. Ventricular enlargement consistent with atrophy is unchanged. Vascular: Extensive arterial calcification. Contrast filled artery is from prior angiography. Skull: Negative Sinuses/Orbits: Mucosal edema throughout the paranasal sinuses Other: None IMPRESSION: Advanced atrophy with chronic microvascular  ischemia. No new area of acute infarct identified Mild patchy areas of increased density in the right cerebellum, right hippocampus, and posterior limb internal capsule the right. These may be areas of contrast staining from angiography in areas of infarction. Hemorrhage not excluded. If hemorrhage, these areas are mild without mass-effect. Continued CT follow-up recommended. Electronically Signed   By: Franchot Gallo M.D.   On: 10/23/2016 12:35   Ct Angio Neck W And/or Wo Contrast  Result Date: 10/23/2016 CLINICAL DATA:  Code stroke.  Left -sided weakness. EXAM: CT ANGIOGRAPHY HEAD AND NECK TECHNIQUE: Multidetector CT imaging of the head and neck was performed using the standard protocol during bolus administration of intravenous contrast. Multiplanar CT image reconstructions and MIPs were obtained to evaluate the vascular anatomy. Carotid stenosis measurements (when applicable) are obtained utilizing NASCET criteria, using the distal internal carotid diameter as the denominator. CONTRAST:  50 mL  Isovue 370 COMPARISON:  None. FINDINGS: CTA NECK FINDINGS Aortic arch: There is atherosclerotic calcification within the aortic arch. The proximal subclavian arteries are normal. No aneurysm or dissection of the aorta. Right carotid system: The right carotid origin is normal. There is mild atherosclerotic plaque within the right common carotid artery. Within the distal right common carotid artery. There are multiple areas of somewhat reduced attenuation. There is central thrombus within the proximal right ICA at the bifurcation with complete occlusion of the right ICA approximately 1.3 cm above the bifurcation. The remainder of the cervical right ICA is occluded. Left carotid system: There is mild multifocal atherosclerotic calcification of the left common carotid artery. No hemodynamically significant stenosis of the left ICA. Vertebral arteries: There is atherosclerotic calcification within the V4 segments of both vertebral arteries with less than 50% stenosis. The vertebral artery origins are normal. The course and caliber of the cervical segments of the vertebral arteries are normal. Skeleton: There is no bony spinal canal stenosis. No lytic or blastic lesions. Other neck: The nasopharynx is clear. The oropharynx and hypopharynx are normal. The epiglottis is normal. The supraglottic larynx, glottis and subglottic larynx are normal. No retropharyngeal collection. The parapharyngeal spaces are preserved. The parotid and submandibular glands are normal. No sialolithiasis or salivary ductal dilatation. The thyroid gland is normal. There is no cervical lymphadenopathy. Upper chest: No pulmonary nodules or masses. No pleural effusion or focal consolidation. Review of the MIP images confirms the above findings CTA HEAD FINDINGS There is motion degradation of the intracranial images. Anterior circulation: --Intracranial internal carotid arteries: The right ICA is occluded at the bifurcation and there is no opacification  of the intracranial right ICA. There is atherosclerotic calcification of the left ICA at the skullbase. --Anterior cerebral arteries: Normal. --Middle cerebral arteries: There is severely diminished opacification of the right MCA. Lead opacification there is is probably secondary to collateral flow across the anterior communicating artery. --Posterior communicating arteries: Present on the left. Not seen on the right. Posterior circulation: --Posterior cerebral arteries: There is a fetal origin of the left PCA. Right PCA is normal. --Superior cerebellar arteries: Normal. --Basilar artery: Normal. --Anterior inferior cerebellar arteries: Normal. --Posterior inferior cerebellar arteries: Normal. Venous sinuses: As permitted by contrast timing, patent. Anatomic variants: Fetal origin of the left PCA. Delayed phase: Not performed Review of the MIP images confirms the above findings IMPRESSION: 1. Occlusion of the right internal carotid artery just distal to the carotid bifurcation 2. Severely diminished opacification of the right middle cerebral artery, in keeping with acute right MCA infarct and the reported clinical left-sided  symptoms. 3. No hemodynamically significant stenosis of the left carotid or vertebral arteries. Critical Value/emergent results were called by telephone at the time of interpretation on 10/23/2016 at 4:57 am to Dr. Kerney Elbe, who verbally acknowledged these results. Electronically Signed   By: Ulyses Jarred M.D.   On: 10/23/2016 05:07   Mr Brain Wo Contrast  Result Date: 10/23/2016 CLINICAL DATA:  82 y/o M; post revascularization of the occluded right internal carotid artery. EXAM: MRI HEAD WITHOUT CONTRAST TECHNIQUE: Multiplanar, multiecho pulse sequences of the brain and surrounding structures were obtained without intravenous contrast. COMPARISON:  None. FINDINGS: Brain: There are areas of diffusion restriction in the right MCA territory involving the right insula, frontal operculum,  lateral temporal parietal junction, and anteromedial temporal lobe with few additional scattered punctate foci compatible with acute infarct. No evidence for hemorrhagic conversion. There is additional punctate cortical focus of diffusion restriction within the left occipital lobe consistent with infarction. Foci of mixed diffusion signal with susceptibility hypointensity within the right cerebellar hemisphere have corresponding low density on CT and are likely represents sequelae of chronic hemorrhagic infarction. There are multiple additional scattered foci of susceptibility hypointensity with a predominantly central distribution likely representing hypertensive etiology. Background of advanced chronic microvascular ischemic changes, parenchymal volume loss, and small lacunar infarcts within the pons and thalamus. Vascular: Normal flow voids. Skull and upper cervical spine: Normal marrow signal. Sinuses/Orbits: Mastoid and paranasal sinus opacification and fluid within the nasopharynx is likely due to intubation. Visualized orbits are unremarkable with bilateral intra-ocular lens replacement. Minimal nonspecific optic nerve dural ectasia. Other: None. IMPRESSION: 1. Areas of acute infarction in the right MCA territory involving the insula, frontal operculum, anteromedial temporal lobe, and lateral temporal parietal junction. No evidence for hemorrhagic conversion. 2. Sequelae of small chronic hemorrhagic infarcts in the right cerebellum and central distribution chronic microhemorrhage likely hypertensive in etiology. 3. Advanced chronic microvascular ischemic changes and parenchymal volume loss. Electronically Signed   By: Kristine Garbe M.D.   On: 10/23/2016 23:04   Ct Cerebral Perfusion W Contrast  Result Date: 10/23/2016 CLINICAL DATA:  Stroke.  Left-sided symptoms. EXAM: CT CEREBRAL PERFUSION WITH CONTRAST TECHNIQUE: Axial CT images of the head were acquired following the administration of IV  contrast. Using a separate software package, perfusion data was created. CONTRAST:  50 mL Isovue 370 IV COMPARISON:  CTA head and neck 10/23/2016 FINDINGS: The examination is compromised by patient motion. Perfusion data maps show a increased Tmax in the right MCA distribution, which fits with the clinical picture and the findings of the CTA. However, there are also elevated Tmax values in the left hemisphere and posterior fossa, which are likely artifactual. The calculated mismatch volume of 193 mL is likely unreliable. IMPRESSION: Motion during the perfusion scan compromises the obtained data. The calculated mismatch volume of 193 mL is likely unreliable and is not expected to provide accurate assessment of the ischemic penumbra. Electronically Signed   By: Ulyses Jarred M.D.   On: 10/23/2016 06:11   Portable Chest Xray  Result Date: 10/23/2016 CLINICAL DATA:  Hypoxia EXAM: PORTABLE CHEST 1 VIEW COMPARISON:  January 29, 2006 FINDINGS: Endotracheal tube tip is 5.2 cm above the carina. Nasogastric tube tip and side port are below the diaphragm. There is no evident pneumothorax. There is no edema or consolidation. Heart is upper normal in size with pulmonary vascular within normal limits. No adenopathy. There is atherosclerotic calcification in the aorta. IMPRESSION: Tube positions as described without pneumothorax. No edema or consolidation.  Stable cardiac silhouette. There is aortic atherosclerosis. Electronically Signed   By: Lowella Grip III M.D.   On: 10/23/2016 15:22   Ct Head Code Stroke W/o Cm  Result Date: 10/23/2016 CLINICAL DATA:  Code stroke.  Left-sided weakness EXAM: CT HEAD WITHOUT CONTRAST TECHNIQUE: Contiguous axial images were obtained from the base of the skull through the vertex without intravenous contrast. COMPARISON:  None. FINDINGS: Brain: No mass lesion, intraparenchymal hemorrhage or extra-axial collection. No evidence of acute infarct. No midline shift or hydrocephalus.  There is periventricular hypoattenuation compatible with chronic microvascular disease. There is diffuse cerebral atrophy without lobar predominance. Vascular: There is atherosclerotic calcification of the carotid and vertebral arteries at the skullbase. Skull: No skull fracture or focal calvarial lesion. Visualized skull base is normal. Sinuses/Orbits: The visualized portions of the paranasal sinuses and mastoid air cells are free of fluid. No advanced mucosal thickening. The visualized orbits are normal. Other: None ASPECTS ([place] Stroke Program Early CT Score) - Ganglionic level infarction (caudate, lentiform nuclei, internal capsule, insula, M1-M3 cortex): 7 - Supraganglionic infarction (M4-M6 cortex): 3 Total score (0-10 with 10 being normal): 10 IMPRESSION: 1. No acute intracranial hemorrhage. 2. ASPECTS is 10.  No CT evidence of acute cortical infarct. These results were called by telephone at the time of interpretation on 10/23/2016 at 4:10 am to Dr. Jeanene Erb, who verbally acknowledged these results. Electronically Signed   By: Ulyses Jarred M.D.   On: 10/23/2016 04:10    Procedures Procedures (including critical care time)  Medications Ordered in ED Medications  iopamidol (ISOVUE-370) 76 % injection (  Canceled Entry 10/23/16 2230)  0.9 %  sodium chloride infusion (75 mL/hr Intravenous Rate/Dose Change 10/23/16 2229)  senna-docusate (Senokot-S) tablet 1 tablet (not administered)  acetaminophen (TYLENOL) tablet 1,000 mg (not administered)    Or  acetaminophen (TYLENOL) suppository 650 mg (not administered)  ondansetron (ZOFRAN) injection 4 mg (not administered)  nicardipine (CARDENE) 75m in 0.86% saline 2070mIV infusion (0.1 mg/ml) (0 mg/hr Intravenous Hold 10/23/16 1430)  chlorhexidine gluconate (MEDLINE KIT) (PERIDEX) 0.12 % solution 15 mL (15 mLs Mouth Rinse Given 10/23/16 2055)  MEDLINE mouth rinse (15 mLs Mouth Rinse Given 10/24/16 0445)  fentaNYL (SUBLIMAZE) injection 100 mcg  (not administered)  fentaNYL (SUBLIMAZE) injection 100 mcg (not administered)  propofol (DIPRIVAN) 1000 MG/100ML infusion (20 mcg/kg/min  84.4 kg Intravenous Rate/Dose Change 10/24/16 0404)  insulin aspart (novoLOG) injection 2-6 Units (2 Units Subcutaneous Not Given 10/24/16 0533)  famotidine (PEPCID) IVPB 20 mg premix (20 mg Intravenous Given 10/23/16 1805)  labetalol (NORMODYNE,TRANDATE) injection 5 mg (not administered)   stroke: mapping our early stages of recovery book (not administered)  iopamidol (ISOVUE-370) 76 % injection (  Canceled Entry 10/23/16 2231)  LORazepam (ATIVAN) injection 1 mg (0 mg Intravenous Duplicate 1167/67/2029470 iopamidol (ISOVUE-370) 76 % injection (50 mLs  Contrast Given 10/23/16 0500)  labetalol (NORMODYNE,TRANDATE) injection 10 mg (10 mg Intravenous Given 10/23/16 0603)  haloperidol lactate (HALDOL) injection 1 mg ( Intravenous Duplicate 1196/28/3626294 iopamidol (ISOVUE-300) 61 % injection (70 mLs  Contrast Given 10/23/16 1135)  nitroGLYCERIN 100 MCG/ML intra-arterial injection (  Duplicate 1176/54/6520354 ceFAZolin (ANCEF) 2-4 GMSF/681EX-%VPB (  Duplicate 1151/70/0127494 aspirin 325 MG tablet (  Duplicate 1149/67/5921638 clopidogrel (PLAVIX) 300 MG tablet (  Duplicate 1146/65/9923570  stroke: mapping our early stages of recovery book ( Does not apply Duplicate 1117/79/3920300 iopamidol (ISOVUE-300) 61 % injection (75 mLs  Contrast Given  10/23/16 1134)  eptifibatide (INTEGRILIN) injection (1.8 mg Intravenous Given 10/23/16 0917)  iopamidol (ISOVUE-300) 61 % injection (75 mLs  Contrast Given 10/23/16 1132)  ceFAZolin (ANCEF) 2-4 VG/715NB-% IVPB (  Duplicate 39/67/28 9791)  nitroGLYCERIN 25 mcg in sodium chloride 0.9 % 59.71 mL (25 mcg/mL) syringe (25 mcg Intra-arterial Given 10/23/16 1114)  potassium chloride 20 MEQ/15ML (10%) solution 40 mEq (40 mEq Oral Given 10/23/16 1805)  sodium chloride 0.9 % bolus 500 mL (500 mLs Intravenous Given 10/24/16 0246)      Initial Impression / Assessment and Plan / ED Course  I have reviewed the triage vital signs and the nursing notes.  Pertinent labs & imaging results that were available during my care of the patient were reviewed by me and considered in my medical decision making (see chart for details).  Clinical Course    Neurologist discussed with interventional radiologist regarding thrombectomy.  Will take to IR suite. Final Clinical Impressions(s) / ED Diagnoses   Final diagnoses:  Stroke (cerebrum) ([place])  Stroke [facility])  Cerebrovascular accident (CVA) due to embolism of right carotid artery ([place])  Cerebrovascular accident (CVA), unspecified mechanism ([place])  Coarctation of aorta, recurrent, post-intervention  Encounter for intubation    New Prescriptions There are no discharge medications for this patient.    Julianne Rice, MD 10/24/16 [phone]

## 2016-10-23 NOTE — Procedures (Signed)
S/P Rt common carotid ,lt common caroptid anr RT vert artery angiograms,follwed by complete revascularization of occluded RT ICA,RT MCA and RT ACA with x 4 passes with solitaire 4mm x 40 mm retrieval device ,x 1pass with the 064 ACE aspiration catheter and 7.2 mg of INTEGRELIN   with a TICI 2B reperfusion.

## 2016-10-23 NOTE — Sedation Documentation (Signed)
Pt transferred to PACU. [facility]. Right groin dressing dry and intact.

## 2016-10-23 NOTE — Progress Notes (Signed)
[name] is a 82 yo male, presents to ED via [facility]. Pt LKW at midnight by family. Found on the floor at 0310 by family, noted to have Left side paralysis and unable to speak clearly. NIHSS 21, CBG 121. Pt transported to IR 0650, report given to Malen GauzeEvelyn IR RN

## 2016-10-23 NOTE — Anesthesia Postprocedure Evaluation (Signed)
Anesthesia Post Note  Patient: Sean [name]  Procedure(s) Performed: Procedure(s) (LRB): RADIOLOGY WITH ANESTHESIA (N/A)  Patient location during evaluation: SICU Anesthesia Type: General Level of consciousness: sedated Pain management: pain level controlled Vital Signs Assessment: post-procedure vital signs reviewed and stable Respiratory status: patient remains intubated per anesthesia plan Cardiovascular status: stable Anesthetic complications: no    Last Vitals:  Vitals:   10/23/16 1315 10/23/16 1337  BP: 111/68   Pulse: 61 (!) 56  Resp: 18 18  Temp:      Last Pain:  Vitals:   10/23/16 0441  TempSrc: Axillary                 Sean [name] DANIEL

## 2016-10-23 NOTE — Consult Note (Addendum)
Referring Physician: Dr. Ranae PalmsYelverton    Chief Complaint: Acute onset of left hemiplegia and aphasia  HPI: Sean [name] is a non-English speaking 82 y.o. male who was last seen normal at midnight. He was found on the floor by family at 3:10 AM and was noted to be paralyzed on the left and unable to speak clearly. Code Stroke was initiated in the field and he was emergently transported to our ED. Not hypoglycemic per EMS. Per family he takes dabigatran for atrial fibrillation. They are unsure if he has HTN, but one of his medications is lisinopril. Also unsure if he has high cholesterol although he takes niacin. He has never had a stroke or a heart attack and does not have diabetes. He has a remote smoking history.    Family states that the patient missed both doses of Pradaxa yesterday and one of his doses the day before yesterday.   LSN: Midnight on 11/10 tPA Given: No, due to use of dabigatran  No past medical history on file.  No past surgical history on file.  No family history on file. Social History:  has no tobacco, alcohol, and drug history on file.  Allergies: Allergies not on file  Medications:  Pradaxa Lisinopril  Niacin  ROS: Unable to obtain due to language barrier in conjunction with aphasia.   Physical Examination:  There were no vitals taken for this visit.  Neurologic Examination: Ment: Agitated, dense expressive and receptive aphasia, dysarthric.  CN: PERRL. Left visual field cut. Forced eye deviation to right can be partially overcome with oculocephalic maneuver. Decreased sensation to left side of face. Left facial droop. Head rotates preferentially to right.  Motor: Left hemiplegia 2-3/5 upper and lower extremity. RUE and RLE 5/5. Sensory: Hypoesthesia to LUE and LLE.  Reflexes: 1+ bilateral upper extremities and patellae. 0 ankle reflexes. Right toe downgoing, left toe upgoing.  Cerebellar: No ataxia disproportionate to weakness.  Gait: Unable to ambulate due to  left hemiplegia.   Results for orders placed or performed during the hospital encounter of 10/23/16 (from the past 48 hour(s))  CBC     Status: None (Preliminary result)   Collection Time: 10/23/16  3:45 AM  Result Value Ref Range   WBC 6.1 4.0 - 10.5 K/uL   RBC 4.61 4.22 - 5.81 MIL/uL   Hemoglobin 13.4 13.0 - 17.0 g/dL   HCT 21.340.1 08.639.0 - 57.852.0 %   MCV 87.0 78.0 - 100.0 fL   MCH 29.1 26.0 - 34.0 pg   MCHC 33.4 30.0 - 36.0 g/dL   RDW 46.913.7 62.911.5 - 52.815.5 %   Platelets PENDING 150 - 400 K/uL  Differential     Status: None   Collection Time: 10/23/16  3:45 AM  Result Value Ref Range   Neutrophils Relative % 60 %   Neutro Abs 3.6 1.7 - 7.7 K/uL   Lymphocytes Relative 29 %   Lymphs Abs 1.8 0.7 - 4.0 K/uL   Monocytes Relative 7 %   Monocytes Absolute 0.5 0.1 - 1.0 K/uL   Eosinophils Relative 3 %   Eosinophils Absolute 0.2 0.0 - 0.7 K/uL   Basophils Relative 1 %   Basophils Absolute 0.1 0.0 - 0.1 K/uL  CBG monitoring, ED     Status: Abnormal   Collection Time: 10/23/16  3:46 AM  Result Value Ref Range   Glucose-Capillary 119 (H) 65 - 99 mg/dL  I-stat troponin, ED     Status: None   Collection Time: 10/23/16  3:49 AM  Result Value Ref Range   Troponin i, poc 0.01 0.00 - 0.08 ng/mL   Comment 3            Comment: Due to the release kinetics of cTnI, a negative result within the first hours of the onset of symptoms does not rule out myocardial infarction with certainty. If myocardial infarction is still suspected, repeat the test at appropriate intervals.   I-Stat Chem 8, ED     Status: Abnormal   Collection Time: 10/23/16  3:52 AM  Result Value Ref Range   Sodium 144 135 - 145 mmol/L   Potassium 3.4 (L) 3.5 - 5.1 mmol/L   Chloride 106 101 - 111 mmol/L   BUN 18 6 - 20 mg/dL   Creatinine, Ser 7.840.60 (L) 0.61 - 1.24 mg/dL   Glucose, Bld 696121 (H) 65 - 99 mg/dL   Calcium, Ion 2.951.03 (L) 1.15 - 1.40 mmol/L   TCO2 25 0 - 100 mmol/L   Hemoglobin 13.6 13.0 - 17.0 g/dL   HCT 28.440.0 13.239.0 -  44.052.0 %   No results found.  Assessment: 82 y.o. male with acute onset of aphasia and left hemiplegia.  1. Not a candidate for tPA as taking an oral anticoagulant for atrial fibrillation.  2. No hemorrhage on CT head. 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 Perfusion study showed mismatch.  3. Based upon the above, he was deemed to be a candidate for endovascular procedure to recanalize right ICA.  4. Discussed risks/benefits with family including 10% risk of hemorrhage from procedure. Family made decision to proceed with endovascular treatment.   Stroke Risk Factors - HTN, atrial fibrillation.  Plan: 1. HgbA1c, fasting lipid panel 2. MRI of brain without contrast 3. PT consult, OT consult, Speech consult 4. Echocardiogram 5. Post-endovascular procedure admission to MICU with frequent neuro checks and BP management.  6. Risk factor modification 7. Telemetry monitoring 8. Also review Dr. Berenice Boutonevashwar's procedure note or call him to ensure that specific additional post-endovascular orders are implemented. 9. Will need to start a statin prior to discharge.  10. Will need to restart dabigatran in 24 hours if CT head 24 hours following endovascular procedure is negative for hemorrhage.  11. Family has stated that they want everything to be done for the patient medically. He is full code.    @Electronically  signed: Dr. Caryl PinaEric Temika Sutphin@   @strokeconsult @ 10/23/2016, 4:05 AM

## 2016-10-23 NOTE — Consult Note (Signed)
PULMONARY / CRITICAL CARE MEDICINE   Name: Sean AbedKong [name] MRN: [id number] DOB: 1926-01-07    ADMISSION DATE:  10/23/2016 CONSULTATION DATE:  10/23/16  REFERRING MD: Dr. Ranae PalmsYelverton   CHIEF COMPLAINT: Acute onset of left hemiplegia and aphasia  HISTORY OF PRESENT ILLNESS:  Information obtained from staff and prior notes as patient is intubated and sedated.  Sean PufferKong Truddie [name] is a non-English speaking 82 y.o. male who was last seen normal at midnight on 11/10. He was found on the floor by family at 3:10 AM and was noted to be paralyzed on the left and unable to speak clearly. Code Stroke was initiated in the field and he was emergently transported ED. Per EMS was not hypoglycemic.On arrival to ED patient was found to be agitated and aphasic. 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 Patient was then taken to IR for recanalization of the right ICA.  Per family he takes dabigatran for atrial fibrillation. They are unsure if he has HTN, but one of his medications is lisinopril. Also unsure if he has high cholesterol although he takes niacin. He has never had a stroke or a heart attack and does not have diabetes. He has a remote smoking history.   PAST MEDICAL HISTORY :  He  has no past medical history on file.  PAST SURGICAL HISTORY: He  has no past surgical history on file.  No Known Allergies  No current facility-administered medications on file prior to encounter.    No current outpatient prescriptions on file prior to encounter.    FAMILY HISTORY:  His has no family status information on file.    SOCIAL HISTORY: He    REVIEW OF SYSTEMS:   Unable to obtain.   SUBJECTIVE:  RN reports patient has been minimally responsive since arrival from IR   VITAL SIGNS: BP 123/64   Pulse (!) 52   Temp 97.2 F (36.2 C)   Resp 18   Wt 84.4 kg (186 lb)   SpO2 100%   HEMODYNAMICS:     VENTILATOR SETTINGS: Vent Mode: PRVC FiO2 (%):  [50 %] 50 % Set Rate:  [18 bmp] 18 bmp Vt Set:  [610 mL] 610 mL PEEP:  [5 cmH20] 5 cmH20 Plateau Pressure:  [18 cmH20] 18 cmH20  INTAKE / OUTPUT: No intake/output data recorded.  PHYSICAL EXAMINATION: General: Elderly male in bed sedated on vent Neuro: sedated, responses to physical stimulation Left hemiplegia, withdrawals on right upper and lower extremities, 3mm pupils sluggish response, negative gag  HEENT: NAD Cardiovascular: s1/s3, no MRG, afib  Lungs: clear, diminished bilaterally  Abdomen: non tender, non distended, active bowel sounds  Musculoskeletal: no acute deformities.  Skin: Warm, dry, intact    LABS:  BMET  Recent Labs Lab 10/23/16 0345 10/23/16 0352 10/23/16 0917 10/23/16 1051  NA 141 144 145 145  K 3.1* 3.4* 2.6* 3.2*  CL 109 106  --   --   CO2 23  --   --   --   BUN 14 18  --   --   CREATININE 0.67 0.60*  --   --   GLUCOSE 121* 121* 150* 131*    Electrolytes  Recent Labs Lab 10/23/16 0345  CALCIUM 8.8*    CBC  Recent Labs Lab 10/23/16 0345 10/23/16 0352 10/23/16 0917 10/23/16 1051  WBC 6.1  --   --   --   HGB 13.4 13.6 10.9*  10.2*  HCT 40.1 40.0 32.0* 30.0*  PLT 114*  --   --   --     Coag's  Recent Labs Lab 10/23/16 0345  APTT 36  INR 1.14    Sepsis Markers No results for input(s): LATICACIDVEN, PROCALCITON, O2SATVEN in the last 168 hours.  ABG No results for input(s): PHART, PCO2ART, PO2ART in the last 168 hours.  Liver Enzymes  Recent Labs Lab 10/23/16 0345  AST 23  ALT 13*  ALKPHOS 49  BILITOT 0.5  ALBUMIN 3.6    Cardiac Enzymes No results for input(s): TROPONINI, PROBNP in the last 168 hours.  Glucose  Recent Labs Lab 10/23/16 0346  GLUCAP 119*    Imaging Ct Angio Head W Or Wo Contrast  Result Date: 10/23/2016 CLINICAL DATA:  Code stroke.  Left -sided weakness. EXAM: CT ANGIOGRAPHY HEAD AND NECK TECHNIQUE: Multidetector CT imaging of the  head and neck was performed using the standard protocol during bolus administration of intravenous contrast. Multiplanar CT image reconstructions and MIPs were obtained to evaluate the vascular anatomy. Carotid stenosis measurements (when applicable) are obtained utilizing NASCET criteria, using the distal internal carotid diameter as the denominator. CONTRAST:  50 mL Isovue 370 COMPARISON:  None. FINDINGS: CTA NECK FINDINGS Aortic arch: There is atherosclerotic calcification within the aortic arch. The proximal subclavian arteries are normal. No aneurysm or dissection of the aorta. Right carotid system: The right carotid origin is normal. There is mild atherosclerotic plaque within the right common carotid artery. Within the distal right common carotid artery. There are multiple areas of somewhat reduced attenuation. There is central thrombus within the proximal right ICA at the bifurcation with complete occlusion of the right ICA approximately 1.3 cm above the bifurcation. The remainder of the cervical right ICA is occluded. Left carotid system: There is mild multifocal atherosclerotic calcification of the left common carotid artery. No hemodynamically significant stenosis of the left ICA. Vertebral arteries: There is atherosclerotic calcification within the V4 segments of both vertebral arteries with less than 50% stenosis. The vertebral artery origins are normal. The course and caliber of the cervical segments of the vertebral arteries are normal. Skeleton: There is no bony spinal canal stenosis. No lytic or blastic lesions. Other neck: The nasopharynx is clear. The oropharynx and hypopharynx are normal. The epiglottis is normal. The supraglottic larynx, glottis and subglottic larynx are normal. No retropharyngeal collection. The parapharyngeal spaces are preserved. The parotid and submandibular glands are normal. No sialolithiasis or salivary ductal dilatation. The thyroid gland is normal. There is no cervical  lymphadenopathy. Upper chest: No pulmonary nodules or masses. No pleural effusion or focal consolidation. Review of the MIP images confirms the above findings CTA HEAD FINDINGS There is motion degradation of the intracranial images. Anterior circulation: --Intracranial internal carotid arteries: The right ICA is occluded at the bifurcation and there is no opacification of the intracranial right ICA. There is atherosclerotic calcification of the left ICA at the skullbase. --Anterior cerebral arteries: Normal. --Middle cerebral arteries: There is severely diminished opacification of the right MCA. Lead opacification there is is probably secondary to collateral flow across the anterior communicating artery. --Posterior communicating arteries: Present on the left. Not seen on the right. Posterior circulation: --Posterior cerebral arteries: There is a fetal origin of the left PCA. Right PCA is normal. --Superior cerebellar arteries: Normal. --Basilar artery: Normal. --Anterior inferior cerebellar arteries: Normal. --Posterior inferior cerebellar arteries: Normal. Venous sinuses: As permitted by contrast timing, patent. Anatomic variants: Fetal origin of the left PCA.  Delayed phase: Not performed Review of the MIP images confirms the above findings IMPRESSION: 1. Occlusion of the right internal carotid artery just distal to the carotid bifurcation 2. Severely diminished opacification of the right middle cerebral artery, in keeping with acute right MCA infarct and the reported clinical left-sided symptoms. 3. No hemodynamically significant stenosis of the left carotid or vertebral arteries. Critical Value/emergent results were called by telephone at the time of interpretation on 10/23/2016 at 4:57 am to Dr. Caryl Pina, who verbally acknowledged these results. Electronically Signed   By: Deatra Robinson M.D.   On: 10/23/2016 05:07   Ct Head Wo Contrast  Result Date: 10/23/2016 CLINICAL DATA:  Stroke. Post  revascularization of occluded right internal carotid artery. EXAM: CT HEAD WITHOUT CONTRAST TECHNIQUE: Contiguous axial images were obtained from the base of the skull through the vertex without intravenous contrast. COMPARISON:  CT head 10/23/2016 FINDINGS: Brain: Advanced atrophy. Chronic microvascular ischemic change in the white matter. No new area of acute infarct since the preprocedure CT Ill-defined hyperdensity in the right hippocampus and posterior limb internal capsule. Similar area of ill-defined hyperdensity in the right superior cerebellum. These may be areas of hemorrhage or contrast staining related to angiography and/or infarction. CT follow-up recommended. No shift of the midline structures. Ventricular enlargement consistent with atrophy is unchanged. Vascular: Extensive arterial calcification. Contrast filled artery is from prior angiography. Skull: Negative Sinuses/Orbits: Mucosal edema throughout the paranasal sinuses Other: None IMPRESSION: Advanced atrophy with chronic microvascular ischemia. No new area of acute infarct identified Mild patchy areas of increased density in the right cerebellum, right hippocampus, and posterior limb internal capsule the right. These may be areas of contrast staining from angiography in areas of infarction. Hemorrhage not excluded. If hemorrhage, these areas are mild without mass-effect. Continued CT follow-up recommended. Electronically Signed   By: Marlan Palau M.D.   On: 10/23/2016 12:35   Ct Angio Neck W And/or Wo Contrast  Result Date: 10/23/2016 CLINICAL DATA:  Code stroke.  Left -sided weakness. EXAM: CT ANGIOGRAPHY HEAD AND NECK TECHNIQUE: Multidetector CT imaging of the head and neck was performed using the standard protocol during bolus administration of intravenous contrast. Multiplanar CT image reconstructions and MIPs were obtained to evaluate the vascular anatomy. Carotid stenosis measurements (when applicable) are obtained utilizing NASCET  criteria, using the distal internal carotid diameter as the denominator. CONTRAST:  50 mL Isovue 370 COMPARISON:  None. FINDINGS: CTA NECK FINDINGS Aortic arch: There is atherosclerotic calcification within the aortic arch. The proximal subclavian arteries are normal. No aneurysm or dissection of the aorta. Right carotid system: The right carotid origin is normal. There is mild atherosclerotic plaque within the right common carotid artery. Within the distal right common carotid artery. There are multiple areas of somewhat reduced attenuation. There is central thrombus within the proximal right ICA at the bifurcation with complete occlusion of the right ICA approximately 1.3 cm above the bifurcation. The remainder of the cervical right ICA is occluded. Left carotid system: There is mild multifocal atherosclerotic calcification of the left common carotid artery. No hemodynamically significant stenosis of the left ICA. Vertebral arteries: There is atherosclerotic calcification within the V4 segments of both vertebral arteries with less than 50% stenosis. The vertebral artery origins are normal. The course and caliber of the cervical segments of the vertebral arteries are normal. Skeleton: There is no bony spinal canal stenosis. No lytic or blastic lesions. Other neck: The nasopharynx is clear. The oropharynx and hypopharynx are normal. The epiglottis  is normal. The supraglottic larynx, glottis and subglottic larynx are normal. No retropharyngeal collection. The parapharyngeal spaces are preserved. The parotid and submandibular glands are normal. No sialolithiasis or salivary ductal dilatation. The thyroid gland is normal. There is no cervical lymphadenopathy. Upper chest: No pulmonary nodules or masses. No pleural effusion or focal consolidation. Review of the MIP images confirms the above findings CTA HEAD FINDINGS There is motion degradation of the intracranial images. Anterior circulation: --Intracranial internal  carotid arteries: The right ICA is occluded at the bifurcation and there is no opacification of the intracranial right ICA. There is atherosclerotic calcification of the left ICA at the skullbase. --Anterior cerebral arteries: Normal. --Middle cerebral arteries: There is severely diminished opacification of the right MCA. Lead opacification there is is probably secondary to collateral flow across the anterior communicating artery. --Posterior communicating arteries: Present on the left. Not seen on the right. Posterior circulation: --Posterior cerebral arteries: There is a fetal origin of the left PCA. Right PCA is normal. --Superior cerebellar arteries: Normal. --Basilar artery: Normal. --Anterior inferior cerebellar arteries: Normal. --Posterior inferior cerebellar arteries: Normal. Venous sinuses: As permitted by contrast timing, patent. Anatomic variants: Fetal origin of the left PCA. Delayed phase: Not performed Review of the MIP images confirms the above findings IMPRESSION: 1. Occlusion of the right internal carotid artery just distal to the carotid bifurcation 2. Severely diminished opacification of the right middle cerebral artery, in keeping with acute right MCA infarct and the reported clinical left-sided symptoms. 3. No hemodynamically significant stenosis of the left carotid or vertebral arteries. Critical Value/emergent results were called by telephone at the time of interpretation on 10/23/2016 at 4:57 am to Dr. Caryl Pina, who verbally acknowledged these results. Electronically Signed   By: Deatra Robinson M.D.   On: 10/23/2016 05:07   Ct Cerebral Perfusion W Contrast  Result Date: 10/23/2016 CLINICAL DATA:  Stroke.  Left-sided symptoms. EXAM: CT CEREBRAL PERFUSION WITH CONTRAST TECHNIQUE: Axial CT images of the head were acquired following the administration of IV contrast. Using a separate software package, perfusion data was created. CONTRAST:  50 mL Isovue 370 IV COMPARISON:  CTA head and  neck 10/23/2016 FINDINGS: The examination is compromised by patient motion. Perfusion data maps show a increased Tmax in the right MCA distribution, which fits with the clinical picture and the findings of the CTA. However, there are also elevated Tmax values in the left hemisphere and posterior fossa, which are likely artifactual. The calculated mismatch volume of 193 mL is likely unreliable. IMPRESSION: Motion during the perfusion scan compromises the obtained data. The calculated mismatch volume of 193 mL is likely unreliable and is not expected to provide accurate assessment of the ischemic penumbra. Electronically Signed   By: Deatra Robinson M.D.   On: 10/23/2016 06:11   Ct Head Code Stroke W/o Cm  Result Date: 10/23/2016 CLINICAL DATA:  Code stroke.  Left-sided weakness EXAM: CT HEAD WITHOUT CONTRAST TECHNIQUE: Contiguous axial images were obtained from the base of the skull through the vertex without intravenous contrast. COMPARISON:  None. FINDINGS: Brain: No mass lesion, intraparenchymal hemorrhage or extra-axial collection. No evidence of acute infarct. No midline shift or hydrocephalus. There is periventricular hypoattenuation compatible with chronic microvascular disease. There is diffuse cerebral atrophy without lobar predominance. Vascular: There is atherosclerotic calcification of the carotid and vertebral arteries at the skullbase. Skull: No skull fracture or focal calvarial lesion. Visualized skull base is normal. Sinuses/Orbits: The visualized portions of the paranasal sinuses and mastoid air cells are  free of fluid. No advanced mucosal thickening. The visualized orbits are normal. Other: None ASPECTS (Alberta Stroke Program Early CT Score) - Ganglionic level infarction (caudate, lentiform nuclei, internal capsule, insula, M1-M3 cortex): 7 - Supraganglionic infarction (M4-M6 cortex): 3 Total score (0-10 with 10 being normal): 10 IMPRESSION: 1. No acute intracranial hemorrhage. 2. ASPECTS is 10.   No CT evidence of acute cortical infarct. These results were called by telephone at the time of interpretation on 10/23/2016 at 4:10 am to Dr. Barkley BrunsErik Lindzen, who verbally acknowledged these results. Electronically Signed   By: Deatra RobinsonKevin  Herman M.D.   On: 10/23/2016 04:10     STUDIES:  -CTA Head and Neck 11/10 > Occlusion of the right internal carotid artery just distal to the carotid bifurcation. Severely diminished opacification of the right middle cerebral artery.  -CT Head 11/10 >Mild patchy areas of increased density in the right cerebellum,right hippocampus, and posterior limb internal capsule the right   CULTURES:  ANTIBIOTICS:  SIGNIFICANT EVENTS: 11/10 > Presents to ED, IR for  Cerebral angiogram s/p middle cerebral artery embolism. 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 Taken to IR for recanalization of right ICA.    LINES/TUBES: ET tube 11/10>> Left Radial Aline 11/10>>  DISCUSSION: 82 year old male presents to ED 11/10 with acute onset left hemiplegia and asphasia.  CTA revealed occlusion of the right internal carotid artery and right MCA. Went to IR for revascularization. Now on vent and sedated.   ASSESSMENT / PLAN:  PULMONARY A: Acute Respiratory Failure secondary to CVA  P:   -Wean vent as tolerated  -Wean Peep and FiO2 to keep sats >92 -PRVC 8cc/kg -CXR now  -ABG now   CARDIOVASCULAR A:  H/O Afib, HTN HLD P:  -Echo Pending  -Keep Systolic 120-160 -Labetalol PRN for systolic >170 -Hold home Pradaxa, Lisinopril, and Niacin  -Cardiac Monitoring  -ICU hemodynamic monitoring   RENAL A:   Hypokalemia  P:   Replace electrolytes as needed  Strick I & O Daily BMP  NS at 75 ml/hr  GASTROINTESTINAL A:   At risk dysphagia  P:   PPI NPO  Will need swallow eval after extubated  Place OGT   HEMATOLOGIC A:   Thrombocytopenia - unknown baseline,  on chronic anticoagulation  P:  Trend CBC Hold home Pradaxa  SCDs   INFECTIOUS A: No issues.  P:   Trend WBC and fever curve   ENDOCRINE A:   Hyperglycemia  P:   SSI Q4H Glucose checks  HA1C Pending   NEUROLOGIC A:   Right MCA/ACA s/p middle cerebral artery embolism  P:   Per Neurology  MRI pending Frequent Neuro Exams  PRN Fentanyl  Propofol gtt  Wean sedation for RASS goal  RASS goal: 0  FAMILY  - Updates: no family at beside 11/10  - Inter-disciplinary family meet or Palliative Care meeting due by:  10/30/2016  Jovita KussmaulKatalina Eubanks, AG-ACNP [facility]  Pgr: [phone]  PCCM Pgr: [phone]  Attending Note:  82 year old male with PMH of HTN, a-fib and hyperlipidemia who presents with right MCA and ACA CVA s/p IR procedure.  On exam, patient is sedated and intubated with clear lungs.  I reviewed CT of the head myself, ischemia noted.  Discussed with IR-MD and PCCM-NP.  Will maintain intubated and sedated due to sheaths.  Check CXR and ABG now.  Will adjust  vent to ABG.  Goal SBP of 120-140.  AM labs ordered and PCCM will continue to follow.  The patient is critically ill with multiple organ systems failure and requires high complexity decision making for assessment and support, frequent evaluation and titration of therapies, application of advanced monitoring technologies and extensive interpretation of multiple databases.   Critical Care Time devoted to patient care services described in this note is  45  Minutes. This time reflects time of care of this signee Dr Koren Bound. This critical care time does not reflect procedure time, or teaching time or supervisory time of PA/NP/Med student/Med Resident etc but could involve care discussion time.  Alyson Reedy, M.D. [facility] Pulmonary/Critical Care Medicine. Pager: [phone]. After hours pager: [phone].

## 2016-10-23 NOTE — Progress Notes (Signed)
Initial Nutrition Assessment  INTERVENTION:   Recommend Vital AF 1.2 @ 60 ml/hr (1440 ml/day) Provides: 1728 kcal, 108 grams protein, and 1167 ml H2O.   NUTRITION DIAGNOSIS:   Inadequate oral intake related to inability to eat as evidenced by NPO status.  GOAL:   Provide needs based on ASPEN/SCCM guidelines  MONITOR:   Vent status, I & O's  REASON FOR ASSESSMENT:   Ventilator    ASSESSMENT:   Pt admitted with acute onset of aphasia and left hemiplegia with occluded RT ICA, RT MCA, and RT ACA with complete revasuclaraztion.  Spoke with RN who used tape measure to determine height. No family present.   Patient is currently intubated on ventilator support MV: 10.4 L/min Temp (24hrs), Avg:97.6 F (36.4 C), Min:97.2 F (36.2 C), Max:97.8 F (36.6 C)  Medications reviewed and include: K+ 3.2, glucose 131 Labs reviewed Nutrition-Focused physical exam completed. Findings are no fat depletion, no muscle depletion, and no edema.    Diet Order:  Diet NPO time specified  Skin:  Reviewed, no issues (incision)  Last BM:  unknown  Height:   Ht Readings from Last 1 Encounters:  10/23/16 6' (1.829 m)    Weight:   Wt Readings from Last 1 Encounters:  10/23/16 182 lb 15.7 oz (83 kg)    Ideal Body Weight:  80.9 kg  BMI:  Body mass index is 24.82 kg/m.  Estimated Nutritional Needs:   Kcal:  1705  Protein:  100-115 grams  Fluid:  > 1.7 L/day  EDUCATION NEEDS:   No education needs identified at this time  Kendell BaneHeather Shaquanda Graves RD, LDN, CNSC [phone] Pager [phone] After Hours Pager

## 2016-10-23 NOTE — Transfer of Care (Signed)
Immediate Anesthesia Transfer of Care Note  Patient: Sean [name]  Procedure(s) Performed: Procedure(s): RADIOLOGY WITH ANESTHESIA (N/A)  Patient Location: PACU  Anesthesia Type:Regional  Level of Consciousness: sedated and Patient remains intubated per anesthesia plan  Airway & Oxygen Therapy: Patient remains intubated per anesthesia plan and Patient placed on Ventilator (see vital sign flow sheet for setting)  Post-op Assessment: Report given to RN and Post -op Vital signs reviewed and stable  Post vital signs: Reviewed and stable  Last Vitals:  Vitals:   10/23/16 1315 10/23/16 1337  BP: 111/68   Pulse: 61 (!) 56  Resp: 18 18  Temp:      Last Pain:  Vitals:   10/23/16 0441  TempSrc: Axillary         Complications: No apparent anesthesia complications

## 2016-10-24 ENCOUNTER — Encounter (HOSPITAL_COMMUNITY): Payer: Self-pay | Admitting: *Deleted

## 2016-10-24 DIAGNOSIS — I63411 Cerebral infarction due to embolism of right middle cerebral artery: Principal | ICD-10-CM

## 2016-10-24 DIAGNOSIS — I63231 Cerebral infarction due to unspecified occlusion or stenosis of right carotid arteries: Secondary | ICD-10-CM

## 2016-10-24 LAB — GLUCOSE, CAPILLARY
GLUCOSE-CAPILLARY: 100 mg/dL — AB (ref 65–99)
GLUCOSE-CAPILLARY: 132 mg/dL — AB (ref 65–99)
GLUCOSE-CAPILLARY: 112 mg/dL — AB (ref 65–99)
GLUCOSE-CAPILLARY: 110 mg/dL — AB (ref 65–99)
Glucose-Capillary: 118 mg/dL — ABNORMAL HIGH (ref 65–99)

## 2016-10-24 LAB — PHOSPHORUS
PHOSPHORUS: 2.8 mg/dL (ref 2.5–4.6)
Phosphorus: 2.8 mg/dL (ref 2.5–4.6)
Phosphorus: 2.7 mg/dL (ref 2.5–4.6)

## 2016-10-24 LAB — BASIC METABOLIC PANEL
Anion gap: 7 (ref 5–15)
BUN: 8 mg/dL (ref 6–20)
CHLORIDE: 113 mmol/L — AB (ref 101–111)
CO2: 21 mmol/L — AB (ref 22–32)
CREATININE: 0.68 mg/dL (ref 0.61–1.24)
Calcium: 7.8 mg/dL — ABNORMAL LOW (ref 8.9–10.3)
GFR calc non Af Amer: >60 mL/min (ref >60)
Glucose, Bld: 118 mg/dL — ABNORMAL HIGH (ref 65–99)
POTASSIUM: 2.9 mmol/L — AB (ref 3.5–5.1)
Sodium: 141 mmol/L (ref 135–145)

## 2016-10-24 LAB — CBC WITH DIFFERENTIAL/PLATELET
Basophils Absolute: 0 10*3/uL (ref 0.0–0.1)
Basophils Relative: 0 %
EOS ABS: 0.1 10*3/uL (ref 0.0–0.7)
Eosinophils Relative: 1 %
HEMATOCRIT: 29.7 % — AB (ref 39.0–52.0)
Hemoglobin: 9.7 g/dL — ABNORMAL LOW (ref 13.0–17.0)
LYMPHS ABS: 1.2 10*3/uL (ref 0.7–4.0)
LYMPHS PCT: 19 %
MCH: 28.2 pg (ref 26.0–34.0)
MCHC: 32.7 g/dL (ref 30.0–36.0)
MCV: 86.3 fL (ref 78.0–100.0)
MONOS PCT: 8 %
Monocytes Absolute: 0.5 10*3/uL (ref 0.1–1.0)
NEUTROS PCT: 72 %
Neutro Abs: 4.7 10*3/uL (ref 1.7–7.7)
Platelets: 109 10*3/uL — ABNORMAL LOW (ref 150–400)
RBC: 3.44 MIL/uL — ABNORMAL LOW (ref 4.22–5.81)
RDW: 14.4 % (ref 11.5–15.5)
WBC: 6.5 10*3/uL (ref 4.0–10.5)

## 2016-10-24 LAB — LIPID PANEL
CHOL/HDL RATIO: 3.6 ratio
CHOLESTEROL: 104 mg/dL (ref 0–200)
HDL: 29 mg/dL — AB (ref >40)
LDL Cholesterol: 53 mg/dL (ref 0–99)
Triglycerides: 111 mg/dL (ref <150)
VLDL: 22 mg/dL (ref 0–40)

## 2016-10-24 LAB — MAGNESIUM
Magnesium: 1.7 mg/dL (ref 1.7–2.4)
Magnesium: 1.7 mg/dL (ref 1.7–2.4)
Magnesium: 2.2 mg/dL (ref 1.7–2.4)

## 2016-10-24 MED ORDER — SODIUM CHLORIDE 0.9 % IV BOLUS (SEPSIS)
500.0000 mL | Freq: Once | INTRAVENOUS | Status: AC
  Administered 2016-10-24: 500 mL via INTRAVENOUS

## 2016-10-24 MED ORDER — MAGNESIUM SULFATE 2 GM/50ML IV SOLN
2.0000 g | Freq: Once | INTRAVENOUS | Status: AC
  Administered 2016-10-24: 2 g via INTRAVENOUS
  Filled 2016-10-24: qty 50

## 2016-10-24 MED ORDER — VITAL HIGH PROTEIN PO LIQD: 1000.0000 mL | ORAL | Status: DC

## 2016-10-24 MED ORDER — ASPIRIN 325 MG PO TABS
325.0000 mg | ORAL_TABLET | Freq: Every day | ORAL | Status: DC
  Administered 2016-10-24 – 2016-11-02 (×9): 325 mg via ORAL
  Filled 2016-10-24 (×9): qty 1

## 2016-10-24 MED ORDER — STROKE: EARLY STAGES OF RECOVERY BOOK
Freq: Once | Status: AC
  Administered 2016-10-24: 09:00:00
  Filled 2016-10-24: qty 1

## 2016-10-24 MED ORDER — HYDRALAZINE HCL 20 MG/ML IJ SOLN
INTRAMUSCULAR | Status: AC
  Filled 2016-10-24: qty 1

## 2016-10-24 MED ORDER — PRO-STAT SUGAR FREE PO LIQD
30.0000 mL | Freq: Two times a day (BID) | ORAL | Status: DC
  Administered 2016-10-24 – 2016-10-26 (×5): 30 mL
  Filled 2016-10-24 (×5): qty 30

## 2016-10-24 MED ORDER — POTASSIUM CHLORIDE 20 MEQ/15ML (10%) PO SOLN
30.0000 meq | Freq: Once | ORAL | Status: AC
  Administered 2016-10-24: 30 meq
  Filled 2016-10-24: qty 30

## 2016-10-24 MED ORDER — VITAL AF 1.2 CAL PO LIQD
1000.0000 mL | ORAL | Status: DC
  Administered 2016-10-24 – 2016-10-25 (×2): 1000 mL

## 2016-10-24 MED ORDER — SODIUM CHLORIDE 0.9 % IV SOLN
30.0000 meq | Freq: Once | INTRAVENOUS | Status: AC
  Administered 2016-10-24: 30 meq via INTRAVENOUS
  Filled 2016-10-24: qty 15

## 2016-10-24 MED ORDER — HYDRALAZINE HCL 20 MG/ML IJ SOLN
10.0000 mg | INTRAMUSCULAR | Status: DC | PRN
  Administered 2016-10-24: 10 mg via INTRAVENOUS

## 2016-10-24 MED ORDER — HEPARIN SODIUM (PORCINE) 5000 UNIT/ML IJ SOLN
5000.0000 [IU] | Freq: Three times a day (TID) | INTRAMUSCULAR | Status: DC
  Administered 2016-10-24 – 2016-11-02 (×27): 5000 [IU] via SUBCUTANEOUS
  Filled 2016-10-24 (×26): qty 1

## 2016-10-24 NOTE — Progress Notes (Signed)
eLink Physician-Brief Progress Note Patient Name: Luis AbedKong Denley DOB: [birth year] MRN: [id number]   Date of Service  10/24/2016  HPI/Events of Note  K+ = 2.9 and Creatinine = 0.68.  eICU Interventions  Will replace K+.     Intervention Category Intermediate Interventions: Electrolyte abnormality - evaluation and management  Sommer,Steven Eugene 10/24/2016, 6:24 AM

## 2016-10-24 NOTE — Progress Notes (Signed)
Referring Physician(s): Dr Jerel ShepherdJ Xu  Supervising Physician: Julieanne Cottoneveshwar, Sanjeev  Patient Status:  [facility]  Chief Complaint:  CVA 10/23/16: CEREBRAL ANGIOGRAM [[id number] (Custom)]       S/P Rt common carotid ,lt common caroptid anr RT vert artery angiograms,follwed by complete revascularization of occluded RT ICA,RT MCA and RT ACA with x 4 passes with solitaire 4mm x 40 mm retrieval device ,x 1pass with the 064 ACE aspiration catheter and 7.2 mg of INTEGRELIN   with a TICI 2B reperfusion.       Subjective:  Pt on vent No repsponse to me Did move rt foot spontaneously  Allergies: Patient has no known allergies.  Medications: Prior to Admission medications   Not on File     Vital Signs: BP (!) 160/60   Pulse (!) 54   Temp 99.8 F (37.7 C) (Axillary)   Resp 18   Ht 6' (1.829 m)   Wt 182 lb 15.7 oz (83 kg)   SpO2 100%   BMI 24.82 kg/m   Physical Exam  Pulmonary/Chest:  vent  Musculoskeletal:  Moved right foot spontaneously  Skin: Skin is warm.  Rt groin  NT no bleeding No hematoma Rt foot 1+ pulses  Nursing note and vitals reviewed.   Imaging: Ct Angio Head W Or Wo Contrast  Result Date: 10/23/2016 CLINICAL DATA:  Code stroke.  Left -sided weakness. EXAM: CT ANGIOGRAPHY HEAD AND NECK TECHNIQUE: Multidetector CT imaging of the head and neck was performed using the standard protocol during bolus administration of intravenous contrast. Multiplanar CT image reconstructions and MIPs were obtained to evaluate the vascular anatomy. Carotid stenosis measurements (when applicable) are obtained utilizing NASCET criteria, using the distal internal carotid diameter as the denominator. CONTRAST:  50 mL Isovue 370 COMPARISON:  None. FINDINGS: CTA NECK FINDINGS Aortic arch: There is atherosclerotic calcification within the aortic arch. The proximal subclavian arteries are normal. No aneurysm or dissection of the aorta. Right carotid system: The right carotid origin is  normal. There is mild atherosclerotic plaque within the right common carotid artery. Within the distal right common carotid artery. There are multiple areas of somewhat reduced attenuation. There is central thrombus within the proximal right ICA at the bifurcation with complete occlusion of the right ICA approximately 1.3 cm above the bifurcation. The remainder of the cervical right ICA is occluded. Left carotid system: There is mild multifocal atherosclerotic calcification of the left common carotid artery. No hemodynamically significant stenosis of the left ICA. Vertebral arteries: There is atherosclerotic calcification within the V4 segments of both vertebral arteries with less than 50% stenosis. The vertebral artery origins are normal. The course and caliber of the cervical segments of the vertebral arteries are normal. Skeleton: There is no bony spinal canal stenosis. No lytic or blastic lesions. Other neck: The nasopharynx is clear. The oropharynx and hypopharynx are normal. The epiglottis is normal. The supraglottic larynx, glottis and subglottic larynx are normal. No retropharyngeal collection. The parapharyngeal spaces are preserved. The parotid and submandibular glands are normal. No sialolithiasis or salivary ductal dilatation. The thyroid gland is normal. There is no cervical lymphadenopathy. Upper chest: No pulmonary nodules or masses. No pleural effusion or focal consolidation. Review of the MIP images confirms the above findings CTA HEAD FINDINGS There is motion degradation of the intracranial images. Anterior circulation: --Intracranial internal carotid arteries: The right ICA is occluded at the bifurcation and there is no opacification of the intracranial right ICA. There is atherosclerotic calcification of the left  ICA at the skullbase. --Anterior cerebral arteries: Normal. --Middle cerebral arteries: There is severely diminished opacification of the right MCA. Lead opacification there is is  probably secondary to collateral flow across the anterior communicating artery. --Posterior communicating arteries: Present on the left. Not seen on the right. Posterior circulation: --Posterior cerebral arteries: There is a fetal origin of the left PCA. Right PCA is normal. --Superior cerebellar arteries: Normal. --Basilar artery: Normal. --Anterior inferior cerebellar arteries: Normal. --Posterior inferior cerebellar arteries: Normal. Venous sinuses: As permitted by contrast timing, patent. Anatomic variants: Fetal origin of the left PCA. Delayed phase: Not performed Review of the MIP images confirms the above findings IMPRESSION: 1. Occlusion of the right internal carotid artery just distal to the carotid bifurcation 2. Severely diminished opacification of the right middle cerebral artery, in keeping with acute right MCA infarct and the reported clinical left-sided symptoms. 3. No hemodynamically significant stenosis of the left carotid or vertebral arteries. Critical Value/emergent results were called by telephone at the time of interpretation on 10/23/2016 at 4:57 am to Dr. Caryl Pina, who verbally acknowledged these results. Electronically Signed   By: Deatra Robinson M.D.   On: 10/23/2016 05:07   Ct Head Wo Contrast  Result Date: 10/23/2016 CLINICAL DATA:  Stroke. Post revascularization of occluded right internal carotid artery. EXAM: CT HEAD WITHOUT CONTRAST TECHNIQUE: Contiguous axial images were obtained from the base of the skull through the vertex without intravenous contrast. COMPARISON:  CT head 10/23/2016 FINDINGS: Brain: Advanced atrophy. Chronic microvascular ischemic change in the white matter. No new area of acute infarct since the preprocedure CT Ill-defined hyperdensity in the right hippocampus and posterior limb internal capsule. Similar area of ill-defined hyperdensity in the right superior cerebellum. These may be areas of hemorrhage or contrast staining related to angiography and/or  infarction. CT follow-up recommended. No shift of the midline structures. Ventricular enlargement consistent with atrophy is unchanged. Vascular: Extensive arterial calcification. Contrast filled artery is from prior angiography. Skull: Negative Sinuses/Orbits: Mucosal edema throughout the paranasal sinuses Other: None IMPRESSION: Advanced atrophy with chronic microvascular ischemia. No new area of acute infarct identified Mild patchy areas of increased density in the right cerebellum, right hippocampus, and posterior limb internal capsule the right. These may be areas of contrast staining from angiography in areas of infarction. Hemorrhage not excluded. If hemorrhage, these areas are mild without mass-effect. Continued CT follow-up recommended. Electronically Signed   By: Marlan Palau M.D.   On: 10/23/2016 12:35   Ct Angio Neck W And/or Wo Contrast  Result Date: 10/23/2016 CLINICAL DATA:  Code stroke.  Left -sided weakness. EXAM: CT ANGIOGRAPHY HEAD AND NECK TECHNIQUE: Multidetector CT imaging of the head and neck was performed using the standard protocol during bolus administration of intravenous contrast. Multiplanar CT image reconstructions and MIPs were obtained to evaluate the vascular anatomy. Carotid stenosis measurements (when applicable) are obtained utilizing NASCET criteria, using the distal internal carotid diameter as the denominator. CONTRAST:  50 mL Isovue 370 COMPARISON:  None. FINDINGS: CTA NECK FINDINGS Aortic arch: There is atherosclerotic calcification within the aortic arch. The proximal subclavian arteries are normal. No aneurysm or dissection of the aorta. Right carotid system: The right carotid origin is normal. There is mild atherosclerotic plaque within the right common carotid artery. Within the distal right common carotid artery. There are multiple areas of somewhat reduced attenuation. There is central thrombus within the proximal right ICA at the bifurcation with complete  occlusion of the right ICA approximately 1.3 cm above the  bifurcation. The remainder of the cervical right ICA is occluded. Left carotid system: There is mild multifocal atherosclerotic calcification of the left common carotid artery. No hemodynamically significant stenosis of the left ICA. Vertebral arteries: There is atherosclerotic calcification within the V4 segments of both vertebral arteries with less than 50% stenosis. The vertebral artery origins are normal. The course and caliber of the cervical segments of the vertebral arteries are normal. Skeleton: There is no bony spinal canal stenosis. No lytic or blastic lesions. Other neck: The nasopharynx is clear. The oropharynx and hypopharynx are normal. The epiglottis is normal. The supraglottic larynx, glottis and subglottic larynx are normal. No retropharyngeal collection. The parapharyngeal spaces are preserved. The parotid and submandibular glands are normal. No sialolithiasis or salivary ductal dilatation. The thyroid gland is normal. There is no cervical lymphadenopathy. Upper chest: No pulmonary nodules or masses. No pleural effusion or focal consolidation. Review of the MIP images confirms the above findings CTA HEAD FINDINGS There is motion degradation of the intracranial images. Anterior circulation: --Intracranial internal carotid arteries: The right ICA is occluded at the bifurcation and there is no opacification of the intracranial right ICA. There is atherosclerotic calcification of the left ICA at the skullbase. --Anterior cerebral arteries: Normal. --Middle cerebral arteries: There is severely diminished opacification of the right MCA. Lead opacification there is is probably secondary to collateral flow across the anterior communicating artery. --Posterior communicating arteries: Present on the left. Not seen on the right. Posterior circulation: --Posterior cerebral arteries: There is a fetal origin of the left PCA. Right PCA is normal.  --Superior cerebellar arteries: Normal. --Basilar artery: Normal. --Anterior inferior cerebellar arteries: Normal. --Posterior inferior cerebellar arteries: Normal. Venous sinuses: As permitted by contrast timing, patent. Anatomic variants: Fetal origin of the left PCA. Delayed phase: Not performed Review of the MIP images confirms the above findings IMPRESSION: 1. Occlusion of the right internal carotid artery just distal to the carotid bifurcation 2. Severely diminished opacification of the right middle cerebral artery, in keeping with acute right MCA infarct and the reported clinical left-sided symptoms. 3. No hemodynamically significant stenosis of the left carotid or vertebral arteries. Critical Value/emergent results were called by telephone at the time of interpretation on 10/23/2016 at 4:57 am to Dr. Caryl Pina, who verbally acknowledged these results. Electronically Signed   By: Deatra Robinson M.D.   On: 10/23/2016 05:07   Mr Brain Wo Contrast  Result Date: 10/23/2016 CLINICAL DATA:  82 y/o M; post revascularization of the occluded right internal carotid artery. EXAM: MRI HEAD WITHOUT CONTRAST TECHNIQUE: Multiplanar, multiecho pulse sequences of the brain and surrounding structures were obtained without intravenous contrast. COMPARISON:  None. FINDINGS: Brain: There are areas of diffusion restriction in the right MCA territory involving the right insula, frontal operculum, lateral temporal parietal junction, and anteromedial temporal lobe with few additional scattered punctate foci compatible with acute infarct. No evidence for hemorrhagic conversion. There is additional punctate cortical focus of diffusion restriction within the left occipital lobe consistent with infarction. Foci of mixed diffusion signal with susceptibility hypointensity within the right cerebellar hemisphere have corresponding low density on CT and are likely represents sequelae of chronic hemorrhagic infarction. There are  multiple additional scattered foci of susceptibility hypointensity with a predominantly central distribution likely representing hypertensive etiology. Background of advanced chronic microvascular ischemic changes, parenchymal volume loss, and small lacunar infarcts within the pons and thalamus. Vascular: Normal flow voids. Skull and upper cervical spine: Normal marrow signal. Sinuses/Orbits: Mastoid and paranasal sinus opacification and  fluid within the nasopharynx is likely due to intubation. Visualized orbits are unremarkable with bilateral intra-ocular lens replacement. Minimal nonspecific optic nerve dural ectasia. Other: None. IMPRESSION: 1. Areas of acute infarction in the right MCA territory involving the insula, frontal operculum, anteromedial temporal lobe, and lateral temporal parietal junction. No evidence for hemorrhagic conversion. 2. Sequelae of small chronic hemorrhagic infarcts in the right cerebellum and central distribution chronic microhemorrhage likely hypertensive in etiology. 3. Advanced chronic microvascular ischemic changes and parenchymal volume loss. Electronically Signed   By: Mitzi Hansen M.D.   On: 10/23/2016 23:04   Ct Cerebral Perfusion W Contrast  Result Date: 10/23/2016 CLINICAL DATA:  Stroke.  Left-sided symptoms. EXAM: CT CEREBRAL PERFUSION WITH CONTRAST TECHNIQUE: Axial CT images of the head were acquired following the administration of IV contrast. Using a separate software package, perfusion data was created. CONTRAST:  50 mL Isovue 370 IV COMPARISON:  CTA head and neck 10/23/2016 FINDINGS: The examination is compromised by patient motion. Perfusion data maps show a increased Tmax in the right MCA distribution, which fits with the clinical picture and the findings of the CTA. However, there are also elevated Tmax values in the left hemisphere and posterior fossa, which are likely artifactual. The calculated mismatch volume of 193 mL is likely unreliable.  IMPRESSION: Motion during the perfusion scan compromises the obtained data. The calculated mismatch volume of 193 mL is likely unreliable and is not expected to provide accurate assessment of the ischemic penumbra. Electronically Signed   By: Deatra Robinson M.D.   On: 10/23/2016 06:11   Portable Chest Xray  Result Date: 10/23/2016 CLINICAL DATA:  Hypoxia EXAM: PORTABLE CHEST 1 VIEW COMPARISON:  January 29, 2006 FINDINGS: Endotracheal tube tip is 5.2 cm above the carina. Nasogastric tube tip and side port are below the diaphragm. There is no evident pneumothorax. There is no edema or consolidation. Heart is upper normal in size with pulmonary vascular within normal limits. No adenopathy. There is atherosclerotic calcification in the aorta. IMPRESSION: Tube positions as described without pneumothorax. No edema or consolidation. Stable cardiac silhouette. There is aortic atherosclerosis. Electronically Signed   By: Bretta Bang III M.D.   On: 10/23/2016 15:22   Ct Head Code Stroke W/o Cm  Result Date: 10/23/2016 CLINICAL DATA:  Code stroke.  Left-sided weakness EXAM: CT HEAD WITHOUT CONTRAST TECHNIQUE: Contiguous axial images were obtained from the base of the skull through the vertex without intravenous contrast. COMPARISON:  None. FINDINGS: Brain: No mass lesion, intraparenchymal hemorrhage or extra-axial collection. No evidence of acute infarct. No midline shift or hydrocephalus. There is periventricular hypoattenuation compatible with chronic microvascular disease. There is diffuse cerebral atrophy without lobar predominance. Vascular: There is atherosclerotic calcification of the carotid and vertebral arteries at the skullbase. Skull: No skull fracture or focal calvarial lesion. Visualized skull base is normal. Sinuses/Orbits: The visualized portions of the paranasal sinuses and mastoid air cells are free of fluid. No advanced mucosal thickening. The visualized orbits are normal. Other: None ASPECTS  (Alberta Stroke Program Early CT Score) - Ganglionic level infarction (caudate, lentiform nuclei, internal capsule, insula, M1-M3 cortex): 7 - Supraganglionic infarction (M4-M6 cortex): 3 Total score (0-10 with 10 being normal): 10 IMPRESSION: 1. No acute intracranial hemorrhage. 2. ASPECTS is 10.  No CT evidence of acute cortical infarct. These results were called by telephone at the time of interpretation on 10/23/2016 at 4:10 am to Dr. Barkley Bruns, who verbally acknowledged these results. Electronically Signed   By: Chrisandra Netters.D.  On: 10/23/2016 04:10    Labs:  CBC:  Recent Labs  10/23/16 0345 10/23/16 0352 10/23/16 0917 10/23/16 1051 10/24/16 0500  WBC 6.1  --   --   --  6.5  HGB 13.4 13.6 10.9* 10.2* 9.7*  HCT 40.1 40.0 32.0* 30.0* 29.7*  PLT 114*  --   --   --  109*    COAGS:  Recent Labs  10/23/16 0345  INR 1.14  APTT 36    BMP:  Recent Labs  10/23/16 0345 10/23/16 0352 10/23/16 0917 10/23/16 1051 10/24/16 0500  NA 141 144 145 145 141  K 3.1* 3.4* 2.6* 3.2* 2.9*  CL 109 106  --   --  113*  CO2 23  --   --   --  21*  GLUCOSE 121* 121* 150* 131* 118*  BUN 14 18  --   --  8  CALCIUM 8.8*  --   --   --  7.8*  CREATININE 0.67 0.60*  --   --  0.68  GFRNONAA >60  --   --   --  >60  GFRAA >60  --   --   --  >60    LIVER FUNCTION TESTS:  Recent Labs  10/23/16 0345  BILITOT 0.5  AST 23  ALT 13*  ALKPHOS 49  PROT 7.2  ALBUMIN 3.6    Assessment and Plan:  CVA Rt ICA/MCA/ACA revascularization 11/10 in IR with Dr Corliss Skainseveshwar Will follow  Electronically Signed: Ralene MuskratURPIN,Quayshawn Nin A 10/24/2016, 12:34 PM   I spent a total of 15 Minutes at the the patient's bedside AND on the patient's hospital floor or unit, greater than 50% of which was counseling/coordinating care for CVA- Rt ICA/MCA/ACA revasc

## 2016-10-24 NOTE — Progress Notes (Signed)
The 9 fr sheath was removed at 8:28 and hemostasis was achieved with a v-pad at 8:58. The exoseal was unsuccessful due to kinked sheath. A pressure dressing was applied and a 10 lb sandbag. No complications.

## 2016-10-24 NOTE — Progress Notes (Signed)
[facility]  S: admitted yesterday in midst of IR procedure for R ICA/MCA CVA  O:  Vitals:   10/24/16 1200 10/24/16 1300 10/24/16 1400 10/24/16 1515  BP: (!) 113/59 123/69 (!) 109/56 (!) 114/42  Pulse: (!) 48 (!) 48 (!) 48 (!) 49  Resp: 18 18 18    Temp: 98.7 F (37.1 C)     TempSrc: Oral     SpO2: 100% 100% 100%   Weight:      Height:       Vent Mode: PRVC FiO2 (%):  [40 %-70 %] 70 % Set Rate:  [15 bmp-18 bmp] 15 bmp Vt Set:  [610 mL] 610 mL PEEP:  [5 cmH20] 5 cmH20 Plateau Pressure:  [16 cmH20-21 cmH20] 19 cmH20  Gen: chronically ill appearing HENT: NCAT,ETT in place PULM: CTA , vent supported breaths CV: Irreg irreg, no mgr, trace edema GI: BS+, soft, nontender Derm: no cyanosis or rash Neuro: would not move left arm, but will flex left leg at hip and will move R arm/leg  Impression/plan Acute respiratory failure with hypoxemia> wean as tolerated today, SBT today, extubate when OK by neurology Afib > f/u echo, hold anticoagulation until OK by neurology MCA stroke> per neurology  My cc time 32 minutes  Heber CarolinaBrent Erika Hussar, MD [facility] Pager: [phone] Cell: [phone] After 3pm or if no response, call [phone]

## 2016-10-24 NOTE — Progress Notes (Signed)
PT Cancellation Note  Patient Details Name: Sean [name] MRN: [id number] DOB: March 28, 1926   Cancelled Treatment:    Reason Eval/Treat Not Completed: Medical issues which prohibited therapy;Patient not medically ready.  Patient on vent.  Will continue to monitor until appropriate for PT evaluation.   Sean [name], Sean [name] 10/24/2016, 10:43 AM [name]. Renaldo Fiddleravis, PT, [facility] Pager [phone]

## 2016-10-24 NOTE — Progress Notes (Signed)
Nutrition Follow-up  DOCUMENTATION CODES:  Not applicable  INTERVENTION:  Initiate TF via OGT with Vital AF 1.2 at goal rate of 45 ml/h (1080 ml per day) and Prostat 30 ml BID to provide 1897 (401 kcal from sedation) kcals, 111 gm protein, 876 ml free water daily.  NUTRITION DIAGNOSIS:  Inadequate oral intake related to inability to eat as evidenced by NPO status.  GOAL:  Provide needs based on ASPEN/SCCM guidelines  MONITOR:  Diet advancement, Vent status, Labs, I & O's, Weight trends, TF tolerance  REASON FOR ASSESSMENT:  Consult Enteral/tube feeding initiation and management  ASSESSMENT:  Pt admitted with acute onset of aphasia and left hemiplegia with occluded RT ICA, RT MCA, and RT ACA with complete revasuclaraztion.   RD consulted for TF.   Abdomen soft  Pt in Reverse [place] position  Mag/Prop infusions.   Patient is currently intubated on ventilator support MV:10 L/min Temp (24hrs), Avg:98.4 F (36.9 C), Min:97.2 F (36.2 C), Max:99.8 F (37.7 C)  Propofol: 15.2 ml/hr (401 kcals)  Labs: K:2.9, CBGs: 100-120 Meds: Insulin, famotidine, Propofol  Recent Labs Lab 10/23/16 0345 10/23/16 0352 10/23/16 0917 10/23/16 1051 10/24/16 0500 10/24/16 0945  NA 141 144 145 145 141  --   K 3.1* 3.4* 2.6* 3.2* 2.9*  --   CL 109 106  --   --  113*  --   CO2 23  --   --   --  21*  --   BUN 14 18  --   --  8  --   CREATININE 0.67 0.60*  --   --  0.68  --   CALCIUM 8.8*  --   --   --  7.8*  --   MG  --   --   --   --  1.7 1.7  PHOS  --   --   --   --  2.8 2.8  GLUCOSE 121* 121* 150* 131* 118*  --    Diet Order:  Diet NPO time specified  Skin:  Closed incision Groin  Last BM:  Unknown  Height:  Ht Readings from Last 1 Encounters:  10/23/16 6' (1.829 m)   Weight:  Wt Readings from Last 1 Encounters:  10/23/16 182 lb 15.7 oz (83 kg)   Ideal Body Weight:  80.9 kg  BMI:  Body mass index is 24.82 kg/m.  Estimated Nutritional Needs:  Kcal:  1875  kcals Protein:  100-115 g (1/2-1.4 g/kg bw) Fluid:  >1.9 L daily   EDUCATION NEEDS:  No education needs identified at this time  Christophe LouisNathan Tanique Matney RD, LDN, CNSC Clinical Nutrition Pager: [phone] 10/24/2016 11:30 AM

## 2016-10-24 NOTE — Progress Notes (Signed)
eLink Physician-Brief Progress Note Patient Name: Sean AbedKong Barron DOB: 06/21/1926 MRN: [id number]   Date of Service  10/24/2016  HPI/Events of Note  Oliguria.  eICU Interventions  Bolus with 0.9 NaCl 500 mL IV over 30 minutes now.      Intervention Category Intermediate Interventions: Oliguria - evaluation and management  Sean [name] Eugene 10/24/2016, 2:36 AM

## 2016-10-24 NOTE — Progress Notes (Addendum)
No urine o/p this am by approximately 1100.  Foley was easily flushed but no return.  Bladder scanner showed 216 cc retained urine.  Foley was removed, had large clots occluding the distal tip. Condom cath placed and pt spontaneously urinated 275 cc tea-colored urine.  No other output for the rest of the day. Dr. Roda ShuttersXu notified, ordered 500 cc normal saline bolus.

## 2016-10-24 NOTE — Progress Notes (Signed)
PULMONARY / CRITICAL CARE MEDICINE   Name: Sean [name] MRN: [id number] DOB: [birth year]    ADMISSION DATE:  10/23/2016 CONSULTATION DATE:  10/23/16  REFERRING MD: Dr. Ranae Palms   CHIEF COMPLAINT: Acute onset of left hemiplegia and aphasia  BRIEF SUMMARY: 82 y/o M admitted 11/10 with c/o AMS, no movement on left side, unable to speak.  Found to have R ICA occlusion / MCA CVA.  To Neuro IR, returned to ICU on vent.    SUBJECTIVE:  RN reports no acute events.  Sheath remains in place.    VITAL SIGNS: BP (!) 158/67   Pulse 87   Temp 99 F (37.2 C) (Axillary)   Resp 16   Ht 6' (1.829 m)   Wt 182 lb 15.7 oz (83 kg)   SpO2 100%   BMI 24.82 kg/m   HEMODYNAMICS:    VENTILATOR SETTINGS: Vent Mode: PRVC FiO2 (%):  [40 %-50 %] 40 % Set Rate:  [18 bmp] 18 bmp Vt Set:  [610 mL] 610 mL PEEP:  [5 cmH20] 5 cmH20 Plateau Pressure:  [15 cmH20-21 cmH20] 21 cmH20  INTAKE / OUTPUT: I/O last 3 completed shifts: In: 2728.3 [I.V.:2393.3; Other:20; IV Piggyback:315] Out: 1295 [Urine:1095; Blood:200]  PHYSICAL EXAMINATION: General: Elderly male in bed sedated on vent Neuro: sedate, withdraws to pain RUE/RLE, LLE, mild flicker on LUE with painful stimuli HEENT: NAD Cardiovascular: s1/s3, no MRG, afib  Lungs: vent assisted breaths, rhonchi   Abdomen: non tender, non distended, active bowel sounds  Musculoskeletal: no acute deformities.  Skin: Warm, dry, intact    LABS:  BMET  Recent Labs Lab 10/23/16 0345 10/23/16 0352 10/23/16 0917 10/23/16 1051 10/24/16 0500  NA 141 144 145 145 141  K 3.1* 3.4* 2.6* 3.2* 2.9*  CL 109 106  --   --  113*  CO2 23  --   --   --  21*  BUN 14 18  --   --  8  CREATININE 0.67 0.60*  --   --  0.68  GLUCOSE 121* 121* 150* 131* 118*    Electrolytes  Recent Labs Lab 10/23/16 0345 10/24/16 0500  CALCIUM 8.8* 7.8*  MG  --  1.7  PHOS  --  2.8    CBC  Recent Labs Lab 10/23/16 0345  10/23/16 0917 10/23/16 1051 10/24/16 0500  WBC 6.1   --   --   --  6.5  HGB 13.4  < > 10.9* 10.2* 9.7*  HCT 40.1  < > 32.0* 30.0* 29.7*  PLT 114*  --   --   --  109*  < > = values in this interval not displayed.  Coag's  Recent Labs Lab 10/23/16 0345  APTT 36  INR 1.14    Sepsis Markers No results for input(s): LATICACIDVEN, PROCALCITON, O2SATVEN in the last 168 hours.  ABG  Recent Labs Lab 10/23/16 1533  PHART 7.493*  PCO2ART 28.2*  PO2ART 174*    Liver Enzymes  Recent Labs Lab 10/23/16 0345  AST 23  ALT 13*  ALKPHOS 49  BILITOT 0.5  ALBUMIN 3.6    Cardiac Enzymes No results for input(s): TROPONINI, PROBNP in the last 168 hours.  Glucose  Recent Labs Lab 10/23/16 0346 10/23/16 1551 10/23/16 1947 10/23/16 2349 10/24/16 0326  GLUCAP 119* 123* 89 121* 100*    Imaging Ct Head Wo Contrast  Result Date: 10/23/2016 CLINICAL DATA:  Stroke. Post revascularization of occluded right internal carotid artery. EXAM: CT HEAD WITHOUT CONTRAST TECHNIQUE: Contiguous axial images were  obtained from the base of the skull through the vertex without intravenous contrast. COMPARISON:  CT head 10/23/2016 FINDINGS: Brain: Advanced atrophy. Chronic microvascular ischemic change in the white matter. No new area of acute infarct since the preprocedure CT Ill-defined hyperdensity in the right hippocampus and posterior limb internal capsule. Similar area of ill-defined hyperdensity in the right superior cerebellum. These may be areas of hemorrhage or contrast staining related to angiography and/or infarction. CT follow-up recommended. No shift of the midline structures. Ventricular enlargement consistent with atrophy is unchanged. Vascular: Extensive arterial calcification. Contrast filled artery is from prior angiography. Skull: Negative Sinuses/Orbits: Mucosal edema throughout the paranasal sinuses Other: None IMPRESSION: Advanced atrophy with chronic microvascular ischemia. No new area of acute infarct identified Mild patchy areas of  increased density in the right cerebellum, right hippocampus, and posterior limb internal capsule the right. These may be areas of contrast staining from angiography in areas of infarction. Hemorrhage not excluded. If hemorrhage, these areas are mild without mass-effect. Continued CT follow-up recommended. Electronically Signed   By: Marlan Palauharles  Clark M.D.   On: 10/23/2016 12:35   Mr Brain Wo Contrast  Result Date: 10/23/2016 CLINICAL DATA:  82 y/o M; post revascularization of the occluded right internal carotid artery. EXAM: MRI HEAD WITHOUT CONTRAST TECHNIQUE: Multiplanar, multiecho pulse sequences of the brain and surrounding structures were obtained without intravenous contrast. COMPARISON:  None. FINDINGS: Brain: There are areas of diffusion restriction in the right MCA territory involving the right insula, frontal operculum, lateral temporal parietal junction, and anteromedial temporal lobe with few additional scattered punctate foci compatible with acute infarct. No evidence for hemorrhagic conversion. There is additional punctate cortical focus of diffusion restriction within the left occipital lobe consistent with infarction. Foci of mixed diffusion signal with susceptibility hypointensity within the right cerebellar hemisphere have corresponding low density on CT and are likely represents sequelae of chronic hemorrhagic infarction. There are multiple additional scattered foci of susceptibility hypointensity with a predominantly central distribution likely representing hypertensive etiology. Background of advanced chronic microvascular ischemic changes, parenchymal volume loss, and small lacunar infarcts within the pons and thalamus. Vascular: Normal flow voids. Skull and upper cervical spine: Normal marrow signal. Sinuses/Orbits: Mastoid and paranasal sinus opacification and fluid within the nasopharynx is likely due to intubation. Visualized orbits are unremarkable with bilateral intra-ocular lens  replacement. Minimal nonspecific optic nerve dural ectasia. Other: None. IMPRESSION: 1. Areas of acute infarction in the right MCA territory involving the insula, frontal operculum, anteromedial temporal lobe, and lateral temporal parietal junction. No evidence for hemorrhagic conversion. 2. Sequelae of small chronic hemorrhagic infarcts in the right cerebellum and central distribution chronic microhemorrhage likely hypertensive in etiology. 3. Advanced chronic microvascular ischemic changes and parenchymal volume loss. Electronically Signed   By: Mitzi HansenLance  Furusawa-Stratton M.D.   On: 10/23/2016 23:04   Portable Chest Xray  Result Date: 10/23/2016 CLINICAL DATA:  Hypoxia EXAM: PORTABLE CHEST 1 VIEW COMPARISON:  January 29, 2006 FINDINGS: Endotracheal tube tip is 5.2 cm above the carina. Nasogastric tube tip and side port are below the diaphragm. There is no evident pneumothorax. There is no edema or consolidation. Heart is upper normal in size with pulmonary vascular within normal limits. No adenopathy. There is atherosclerotic calcification in the aorta. IMPRESSION: Tube positions as described without pneumothorax. No edema or consolidation. Stable cardiac silhouette. There is aortic atherosclerosis. Electronically Signed   By: Bretta BangWilliam  Woodruff III M.D.   On: 10/23/2016 15:22     STUDIES:  -CTA Head and Neck 11/10 >  Occlusion of the right internal carotid artery just distal to the carotid bifurcation. Severely diminished opacification of the right middle cerebral artery.  -CT Head 11/10 >Mild patchy areas of increased density in the right cerebellum,right hippocampus, and posterior limb internal capsule the right  -ECHO 11/11 >>   CULTURES:  ANTIBIOTICS:  SIGNIFICANT EVENTS: 11/10  Admit, IR for  Cerebral angiogram s/p middle cerebral artery embolism. CTA revealed occlusion of the right internal carotid artery and partial reconstitution at the level of the right ophthalmic with tenuos right MCA  flow. Taken to IR for recanalization of right ICA.    LINES/TUBES: ET tube 11/10 >> Left Radial Aline 11/10 >> R Femoral Sheath 11/10 >>   DISCUSSION: 82 year old male presents to ED 11/10 with acute onset left hemiplegia and asphasia.  CTA revealed occlusion of the right internal carotid artery and right MCA. Went to IR for revascularization. Returned to ICU on vent and sedated.   ASSESSMENT / PLAN:  PULMONARY A: Acute Respiratory Failure secondary to CVA  P:   -PRVC 8 cc/kg  -Wean Peep and FiO2 to keep sats >92 - intermittent CXR -daily SBT / WUA  CARDIOVASCULAR A:  H/O Afib, HTN HLD P:  -await ECHO -Keep Systolic 120-160 -Labetalol PRN for systolic >170 -Hold home Pradaxa, Lisinopril, and Niacin  -Cardiac Monitoring  -ICU hemodynamic monitoring   RENAL A:   Hypokalemia  P:   Replace electrolytes as needed  Strick I & O Daily BMP  NS to 50 ml/hr  GASTROINTESTINAL A:   R/O dysphagia  P:   PPI NPO  Will need swallow eval after extubated  OGT Begin TF  HEMATOLOGIC A:   Thrombocytopenia - unknown baseline, on chronic anticoagulation  Anemia  P:  Trend CBC Hold home Pradaxa  SCDs   INFECTIOUS A: No issues.  P:   Trend WBC and fever curve   ENDOCRINE A:   Hyperglycemia  P:   SSI Q4H Glucose checks   NEUROLOGIC A:   Right MCA/ACA s/p middle cerebral artery embolism  P:   Per Neurology  MRI pending Frequent Neuro Exams  PRN Fentanyl  Propofol gtt  Wean sedation for RASS goal  RASS goal: 0 to -1  FAMILY  - Updates: no family at beside 11/11 am  - Inter-disciplinary family meet or Palliative Care meeting due by:  10/30/2016  Canary BrimBrandi Tobenna Needs, NP-C [facility] Pgr: [phone] or if no answer [phone] 10/24/2016, 8:05 AM

## 2016-10-24 NOTE — Progress Notes (Signed)
Lab value of 2.9 potassium read to eLink doctor. New orders put in to replace.

## 2016-10-25 ENCOUNTER — Inpatient Hospital Stay (HOSPITAL_COMMUNITY): Payer: Medicare (Managed Care)

## 2016-10-25 ENCOUNTER — Encounter (HOSPITAL_COMMUNITY): Payer: Self-pay | Admitting: *Deleted

## 2016-10-25 LAB — CBC
HCT: 29.2 % — ABNORMAL LOW (ref 39.0–52.0)
Hemoglobin: 9.7 g/dL — ABNORMAL LOW (ref 13.0–17.0)
MCH: 29 pg (ref 26.0–34.0)
MCHC: 33.2 g/dL (ref 30.0–36.0)
MCV: 87.2 fL (ref 78.0–100.0)
PLATELETS: 99 10*3/uL — AB (ref 150–400)
RBC: 3.35 MIL/uL — AB (ref 4.22–5.81)
RDW: 14.9 % (ref 11.5–15.5)
WBC: 5.4 10*3/uL (ref 4.0–10.5)

## 2016-10-25 LAB — BASIC METABOLIC PANEL
Anion gap: 7 (ref 5–15)
BUN: 13 mg/dL (ref 6–20)
CHLORIDE: 117 mmol/L — AB (ref 101–111)
CO2: 19 mmol/L — ABNORMAL LOW (ref 22–32)
CREATININE: 0.71 mg/dL (ref 0.61–1.24)
Calcium: 7.9 mg/dL — ABNORMAL LOW (ref 8.9–10.3)
GFR calc Af Amer: >60 mL/min (ref >60)
GFR calc non Af Amer: >60 mL/min (ref >60)
GLUCOSE: 137 mg/dL — AB (ref 65–99)
POTASSIUM: 3.3 mmol/L — AB (ref 3.5–5.1)
Sodium: 143 mmol/L (ref 135–145)

## 2016-10-25 LAB — GLUCOSE, CAPILLARY
GLUCOSE-CAPILLARY: 116 mg/dL — AB (ref 65–99)
GLUCOSE-CAPILLARY: 128 mg/dL — AB (ref 65–99)
GLUCOSE-CAPILLARY: 139 mg/dL — AB (ref 65–99)
GLUCOSE-CAPILLARY: 147 mg/dL — AB (ref 65–99)
GLUCOSE-CAPILLARY: 149 mg/dL — AB (ref 65–99)
Glucose-Capillary: 134 mg/dL — ABNORMAL HIGH (ref 65–99)
Glucose-Capillary: 140 mg/dL — ABNORMAL HIGH (ref 65–99)

## 2016-10-25 LAB — PHOSPHORUS
PHOSPHORUS: 3.1 mg/dL (ref 2.5–4.6)
Phosphorus: 2.8 mg/dL (ref 2.5–4.6)

## 2016-10-25 LAB — HEMOGLOBIN A1C
HEMOGLOBIN A1C: 6.2 % — AB (ref 4.8–5.6)
MEAN PLASMA GLUCOSE: 131 mg/dL

## 2016-10-25 LAB — MAGNESIUM
Magnesium: 2.2 mg/dL (ref 1.7–2.4)
Magnesium: 2 mg/dL (ref 1.7–2.4)

## 2016-10-25 MED ORDER — CHLORHEXIDINE GLUCONATE 0.12% ORAL RINSE (MEDLINE KIT)
15.0000 mL | Freq: Two times a day (BID) | OROMUCOSAL | Status: DC
  Administered 2016-10-25 – 2016-11-03 (×18): 15 mL via OROMUCOSAL

## 2016-10-25 MED ORDER — SODIUM CHLORIDE 0.9 % IV BOLUS (SEPSIS)
1000.0000 mL | Freq: Once | INTRAVENOUS | Status: AC
  Administered 2016-10-25: 1000 mL via INTRAVENOUS

## 2016-10-25 MED ORDER — CHLORHEXIDINE GLUCONATE 0.12 % MT SOLN
OROMUCOSAL | Status: AC
  Filled 2016-10-25: qty 15

## 2016-10-25 MED ORDER — ORAL CARE MOUTH RINSE
15.0000 mL | OROMUCOSAL | Status: DC
  Administered 2016-10-25 – 2016-11-17 (×206): 15 mL via OROMUCOSAL

## 2016-10-25 MED ORDER — SODIUM CHLORIDE 0.9 % IV BOLUS (SEPSIS): 1000.0000 mL | Freq: Once | INTRAVENOUS | Status: DC

## 2016-10-25 NOTE — Progress Notes (Signed)
STROKE TEAM PROGRESS NOTE   SUBJECTIVE (INTERVAL HISTORY) No family is at the bedside today. He is still Intubated and sedated. As per nurse, once sedation off, patient became agitated and hypertension, and sedation has to be put back. Still not moving much on the left side. Overnight urine output was low, had 500 bolus, but not effective. However, patient vital signs stable, and labs are within normal limits.    OBJECTIVE Temp:  [97.8 F (36.6 C)-99.8 F (37.7 C)] 98 F (36.7 C) (11/12 0755) Pulse Rate:  [47-71] 50 (11/12 0700) Cardiac Rhythm: Atrial fibrillation (11/12 0400) Resp:  [0-24] 0 (11/12 0700) BP: (93-162)/(39-81) 105/53 (11/12 0700) SpO2:  [99 %-100 %] 100 % (11/12 0700) Arterial Line BP: (183)/(79) 183/79 (11/11 0800) FiO2 (%):  [40 %-70 %] 40 % (11/12 0400) Weight:  [88.8 kg (195 lb 12.3 oz)] 88.8 kg (195 lb 12.3 oz) (11/12 0500)  CBC:  Recent Labs Lab 10/23/16 0345  10/24/16 0500 10/25/16 0431  WBC 6.1  --  6.5 5.4  NEUTROABS 3.6  --  4.7  --   HGB 13.4  < > 9.7* 9.7*  HCT 40.1  < > 29.7* 29.2*  MCV 87.0  --  86.3 87.2  PLT 114*  --  109* 99*  < > = values in this interval not displayed.  Basic Metabolic Panel:  Recent Labs Lab 10/24/16 0500  10/24/16 1630 10/25/16 0431  NA 141  --   --  143  K 2.9*  --   --  3.3*  CL 113*  --   --  117*  CO2 21*  --   --  19*  GLUCOSE 118*  --   --  137*  BUN 8  --   --  13  CREATININE 0.68  --   --  0.71  CALCIUM 7.8*  --   --  7.9*  MG 1.7  < > 2.2 2.2  PHOS 2.8  < > 2.7 2.8  < > = values in this interval not displayed.  Lipid Panel:     Component Value Date/Time   CHOL 104 10/24/2016 0719   TRIG 111 10/24/2016 0719   HDL 29 (L) 10/24/2016 0719   CHOLHDL 3.6 10/24/2016 0719   VLDL 22 10/24/2016 0719   LDLCALC 53 10/24/2016 0719   HgbA1c: No results found for: HGBA1C Urine Drug Screen:     Component Value Date/Time   LABOPIA NONE DETECTED 10/23/2016 0628   COCAINSCRNUR NONE DETECTED 10/23/2016 0628    LABBENZ NONE DETECTED 10/23/2016 0628   AMPHETMU NONE DETECTED 10/23/2016 0628   THCU NONE DETECTED 10/23/2016 0628   LABBARB NONE DETECTED 10/23/2016 0628      IMAGING I have personally reviewed the radiological images below and agree with the radiology interpretations.  DG Chest Portable - 1 View 10/25/2016 1. Stable support apparatus. 2. Increased interstitial markings on the right versus the left, more focal in the medial right base. This could represent asymmetric edema. Recommend attention on follow-up.  Ct Angio Head and Neck W Or Wo Contrast 10/23/2016 1. Occlusion of the right internal carotid artery just distal to the carotid bifurcation  2. Severely diminished opacification of the right middle cerebral artery, in keeping with acute right MCA infarct and the reported clinical left-sided symptoms.  3. No hemodynamically significant stenosis of the left carotid or vertebral arteries.   Ct Cerebral Perfusion W Contrast 10/23/2016 Motion during the perfusion scan compromises the obtained data. The calculated mismatch volume of 193  mL is likely unreliable and is not expected to provide accurate assessment of the ischemic penumbra.   Ct Head Code Stroke W/o Cm 10/23/2016 1. No acute intracranial hemorrhage.  2. ASPECTS is 10.  No CT evidence of acute cortical infarct.   Cerebral Angiogram S/P Rt common carotid ,lt common caroptid anr RT vert artery angiograms,follwed by complete revascularization of occluded RT ICA,RT MCA and RT ACA with x 4 passes with solitaire 4mm x 40 mm retrieval device ,x 1pass with the 064 ACE aspiration catheter and 7.2 mg of INTEGRELIN   with a TICI 2B reperfusion.  MRI Head Wo Contrast 10/23/2016 1. Areas of acute infarction in the right MCA territory involving the insula, frontal operculum, anteromedial temporal lobe, and lateral temporal parietal junction. No evidence for hemorrhagic conversion. 2. Sequelae of small chronic hemorrhagic  infarcts in the right cerebellum and central distribution chronic microhemorrhage likely hypertensive in etiology. 3. Advanced chronic microvascular ischemic changes and parenchymal volume loss.  Ct Head head  10/25/2016 IMPRESSION: Progressive low-density has developed in the right insular and frontal region and in the right temporal and parietal region consistent with completed infarction in those regions. Mild swelling but no hemorrhage or shift.   TTE pending   PHYSICAL EXAM  Temp:  [97.8 F (36.6 C)-99.8 F (37.7 C)] 98 F (36.7 C) (11/12 0755) Pulse Rate:  [47-71] 50 (11/12 0700) Resp:  [0-24] 0 (11/12 0700) BP: (93-162)/(39-81) 105/53 (11/12 0700) SpO2:  [99 %-100 %] 100 % (11/12 0700) Arterial Line BP: (183)/(79) 183/79 (11/11 0800) FiO2 (%):  [40 %-70 %] 40 % (11/12 0400) Weight:  [88.8 kg (195 lb 12.3 oz)] 88.8 kg (195 lb 12.3 oz) (11/12 0500)  General - Well nourished, well developed, intubated and on sedation.  Ophthalmologic - Fundi not visualized due to noncooperation.  Cardiovascular - irregularly irregular heart rate and rhythm.  Neuro - intubated, on sedation, eyes closed, not following commands. Pupil unequal sizes due to bilateral cataract surgery in the past, left pupil irregular shape, 1 mm, slightly reacting to light. Right pupil 4mm, round shape, tonic pupil, very sluggish to light reaction, down to 3.5 mm with constant light exposure. Facial asymmetry cannot be evaluated due to ET tube. On pain stimulation, left UE 0/5, LLE and RLE mild withdrawal, RUE extension posturing. DTR 1+, no Babinski. Sensation, coordination, and gait not tested.   ASSESSMENT/PLAN Mr. Luis AbedKong Giammarino is a 82 y.o. male with history of hypertension, hyperlipidemia, and atrial fibrillation on Pradaxa presenting with left hemiplegia and speech difficulties.Marland Kitchen. He did not receive IV t-PA due to anticoagulation. Interventional Radiology for cerebral angiogram and subsequent intervention as  above.  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  Resultant  Intubated, left hemiparesis  MRI - acute infarction in the right MCA territory   CTA H&N - acute right MCA infarct. Occlusion of the right ICA.  Repeat CT 10/25/2016 - evolving right MCA infarct, no hemorrhage, no midline shift  2D Echo - pending  LDL - 53  HgbA1c - pending  VTE prophylaxis - subcutaneous heparin Diet NPO time specified  Pradaxa (dabigatran) twice a day prior to admission, now on ASA. Hold off anticoagulation due to moderately large right MCA infarct.  Ongoing aggressive stroke risk factor management  Therapy recommendations:  Pending   Disposition:  Pending  Right ICA occlusion  Likely due to A. fib noncompliance with Pradaxa  S/p mechanical thrombectomy  BP  goal < 160  Chronic A. fib  On Pradaxa at home however missing doses one day PTA  Hold off Pradaxa due to moderately large right MCA infarct  Consider resume Pradaxa in one week if no procedure planned  Respiratory failure  Intubated on sedation  CCM on board  Wean off ventilation as able  Decreased urine output  Low urine output overnight  s/p fluid bolus  Normal kidney function  Vital signs stable  CXR no pulmonary edema  CCM on board  May consider further fluid bolus if needed  Hypertension  Stable   BP goal < 160 due to recanalization of right ICA to avoid hyperperfusion syndrome  Avoid hypotension  Hyperlipidemia  Home meds:  Niacin - not resumed.  LDL 53, goal < 70  Nutrition  NPO  Gastric tube 10/23/2016  Feeding supplement 45 cc / hr - started 10/24/2016  Other Stroke Risk Factors  Advanced age  Other Active Problems  Hypokalemia - supplemented 3.2 -> 2.9 -> 3.3  S/p bilateral cataract surgery  Anemia - 9.7 / 29.7 -> 9.7 / 29.2  Thrombocytopenia - 109 -> 99  Hospital  day # 2  This patient is critically ill due to large right MCA infarct, right ICA occlusion s/p mechanical thrombectomy, A. fib, hypertension and respiratory failure and at significant risk of neurological worsening, death form recurrent infarct, hemorrhagic transformation, hypertensive urgency, heart failure and cerebral edema. This patient's care requires constant monitoring of vital signs, hemodynamics, respiratory and cardiac monitoring, review of multiple databases, neurological assessment, discussion with family, other specialists and medical decision making of high complexity. I spent 40 minutes of neurocritical care time in the care of this patient.  Marvel PlanJindong Bobetta Korf, MD PhD Stroke Neurology 10/25/2016 2:30 PM  To contact Stroke Continuity provider, please refer to [email or web address]. After hours, contact General Neurology

## 2016-10-25 NOTE — Progress Notes (Signed)
Critical care MD notified that patient has not urinated since approximately 1700 last evening. Patient bladder scan showed 244. MD stated to continue monitoring patient, no further orders at this time for foley or in and out catheter.

## 2016-10-25 NOTE — Progress Notes (Signed)
PULMONARY / CRITICAL CARE MEDICINE   Name: Sean [name] MRN: [id number] DOB: 01/09/1943    ADMISSION DATE:  10/23/2016 CONSULTATION DATE:  10/23/16  REFERRING MD: Dr. Ranae PalmsYelverton   CHIEF COMPLAINT: Acute onset of left hemiplegia and aphasia  BRIEF SUMMARY: 82 y/o M admitted 11/10 with c/o AMS, no movement on left side, unable to speak.  Found to have R ICA occlusion / MCA CVA.  To Neuro IR, returned to ICU on vent.    SUBJECTIVE:  RN reports pt failed WUA with no patient effort.  Minimal UOP since yesterday.      VITAL SIGNS: BP (!) 149/59   Pulse (!) 59   Temp 98 F (36.7 C) (Axillary)   Resp (!) 0   Ht 6' (1.829 m)   Wt 195 lb 12.3 oz (88.8 kg)   SpO2 100%   BMI 26.55 kg/m   HEMODYNAMICS:    VENTILATOR SETTINGS: Vent Mode: PRVC FiO2 (%):  [40 %-70 %] 40 % Set Rate:  [15 bmp-18 bmp] 18 bmp Vt Set:  [610 mL] 610 mL PEEP:  [5 cmH20] 5 cmH20 Plateau Pressure:  [16 cmH20-19 cmH20] 19 cmH20  INTAKE / OUTPUT: I/O last 3 completed shifts: In: 3805.4 [I.V.:2590.4; Other:40; NG/GT:810; IV Piggyback:365] Out: 300 [Urine:300]  PHYSICAL EXAMINATION: General: Elderly male in bed sedated on vent Neuro: sedate, withdraws to pain BLE, occasionally with pain with draw to midline with RUE, mild flicker on LUE with painful stimuli HEENT: ETT, mm pink/moist Cardiovascular: s1/s2 regular, brady Lungs: vent assisted breaths, coarse bilaterally   Abdomen: non tender, non distended, active bowel sounds  Musculoskeletal: no acute deformities.  Skin: Warm, dry, intact    LABS:  BMET  Recent Labs Lab 10/23/16 0345 10/23/16 0352  10/23/16 1051 10/24/16 0500 10/25/16 0431  NA 141 144  < > 145 141 143  K 3.1* 3.4*  < > 3.2* 2.9* 3.3*  CL 109 106  --   --  113* 117*  CO2 23  --   --   --  21* 19*  BUN 14 18  --   --  8 13  CREATININE 0.67 0.60*  --   --  0.68 0.71  GLUCOSE 121* 121*  < > 131* 118* 137*  < > = values in this interval not displayed.  Electrolytes  Recent  Labs Lab 10/23/16 0345  10/24/16 0500 10/24/16 0945 10/24/16 1630 10/25/16 0431  CALCIUM 8.8*  --  7.8*  --   --  7.9*  MG  --   < > 1.7 1.7 2.2 2.2  PHOS  --   < > 2.8 2.8 2.7 2.8  < > = values in this interval not displayed.  CBC  Recent Labs Lab 10/23/16 0345  10/23/16 1051 10/24/16 0500 10/25/16 0431  WBC 6.1  --   --  6.5 5.4  HGB 13.4  < > 10.2* 9.7* 9.7*  HCT 40.1  < > 30.0* 29.7* 29.2*  PLT 114*  --   --  109* 99*  < > = values in this interval not displayed.  Coag's  Recent Labs Lab 10/23/16 0345  APTT 36  INR 1.14    Sepsis Markers No results for input(s): LATICACIDVEN, PROCALCITON, O2SATVEN in the last 168 hours.  ABG  Recent Labs Lab 10/23/16 1533  PHART 7.493*  PCO2ART 28.2*  PO2ART 174*    Liver Enzymes  Recent Labs Lab 10/23/16 0345  AST 23  ALT 13*  ALKPHOS 49  BILITOT 0.5  ALBUMIN 3.6  Cardiac Enzymes No results for input(s): TROPONINI, PROBNP in the last 168 hours.  Glucose  Recent Labs Lab 10/24/16 1210 10/24/16 1550 10/24/16 1944 10/25/16 0010 10/25/16 0326 10/25/16 0741  GLUCAP 132* 112* 110* 149* 134* 128*    Imaging Dg Chest Port 1 View  Result Date: 10/25/2016 CLINICAL DATA:  Acute respiratory failure. EXAM: PORTABLE CHEST 1 VIEW COMPARISON:  October 23, 2016 FINDINGS: The ETT is in good position. The NG tube terminates below today's film. No pneumothorax. Probable atelectasis in the left base obscuring the left hemidiaphragm. Increased interstitial markings on the right, more focal in the right base. No other interval changes. IMPRESSION: 1. Stable support apparatus. 2. Increased interstitial markings on the right versus the left, more focal in the medial right base. This could represent asymmetric edema. Recommend attention on follow-up. Electronically Signed   By: Gerome Samavid  Williams III M.D   On: 10/25/2016 07:17     STUDIES:  -CTA Head and Neck 11/10 > Occlusion of the right internal carotid artery just  distal to the carotid bifurcation. Severely diminished opacification of the right middle cerebral artery.  -CT Head 11/10 >Mild patchy areas of increased density in the right cerebellum,right hippocampus, and posterior limb internal capsule the right  MRI 11/10 >> areas of acute infarct in the R MCA territory, no hemorrhagic conversion, likely hypertensive chronic micro hemorrhage in the right cerebellum  -ECHO 11/11 >>   CULTURES:  ANTIBIOTICS:  SIGNIFICANT EVENTS: 11/10  Admit, IR for  Cerebral angiogram s/p middle cerebral artery embolism. 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 Taken to IR for recanalization of right ICA.    LINES/TUBES: ET tube 11/10 >> Left Radial Aline 11/10 >> R Femoral Sheath 11/10 >> 11/11  DISCUSSION: 82 year old male presents to ED 11/10 with acute onset left hemiplegia and asphasia.  CTA revealed occlusion of the right internal carotid artery and right MCA. Went to IR for revascularization. Returned to ICU on vent and sedated.   ASSESSMENT / PLAN:  PULMONARY A: Acute Respiratory Failure secondary to CVA  P:   - PRVC 8 cc/kg, reduce rate to 16 - Wean Peep and FiO2 to keep sats >92 - intermittent CXR - daily SBT   CARDIOVASCULAR A:  H/O Afib, HTN HLD P:  - await ECHO - Keep Systolic 120-160 - Labetalol / hydralazine PRN for systolic > 120-160 - Hold home Pradaxa, Lisinopril, and Niacin  - Cardiac monitoring  - ICU hemodynamic monitoring  - have asked RT to re-measure pt and adjust 8 cc as appropriate  RENAL A:   Hypokalemia  Oliguria - 11/12 P:   - Replace electrolytes as needed  - Strick I & O - Daily BMP  - NS to 50 ml/hr - 1L NS bolus x1 now  GASTROINTESTINAL A:   R/O dysphagia  P:   - PPI - NPO  - Will need swallow eval after extubated  - OGT - Continue TF  HEMATOLOGIC A:   Thrombocytopenia - unknown baseline,  on chronic anticoagulation  Anemia  P:  - Trend CBC - Hold home Pradaxa  - SCDs   INFECTIOUS A: No issues.  P:   - Trend WBC and fever curve   ENDOCRINE A:   Hyperglycemia  P:   - SSI - Q4H Glucose checks   NEUROLOGIC A:   Right MCA/ACA s/p middle cerebral artery embolism  P:   -  Per Neurology  - Repeat CT pending - Frequent Neuro Exams  - PRN Fentanyl  - d/c propofol to allow for neuro exam  - RASS goal: 0    FAMILY  - Updates: no family at beside 11/11 am  - Inter-disciplinary family meet or Palliative Care meeting due by:  10/30/2016   CC Time: 30 minutes  Canary Brim, NP-C [facility] Pgr: [phone] or if no answer [phone] 10/25/2016, 10:09 AM

## 2016-10-25 NOTE — Progress Notes (Signed)
[facility]  I have seen and examined the patient with nurse practitioner/resident and agree with the note above.  We formulated the plan together and I elicited the following history.    Passing SBT for me but not awake  On exam Lungs clear, vent supported breaths RRR, no mgr GI: BS+, soft, nontender  Acute respiratory failure with hypoxemia> at this point only barrier to extubation is mental status, maintain full vent support and VAP prevention for now, hold sedation CVA> continue management per surgery  CCM time 31 minutes  Heber CarolinaBrent McQuaid, MD [facility] Pager: [phone] Cell: [phone] After 3pm or if no response, call [phone]

## 2016-10-26 ENCOUNTER — Inpatient Hospital Stay (HOSPITAL_COMMUNITY): Payer: Medicare (Managed Care)

## 2016-10-26 ENCOUNTER — Encounter (HOSPITAL_COMMUNITY): Payer: Self-pay | Admitting: Interventional Radiology

## 2016-10-26 DIAGNOSIS — R001 Bradycardia, unspecified: Secondary | ICD-10-CM | POA: Diagnosis present

## 2016-10-26 DIAGNOSIS — D696 Thrombocytopenia, unspecified: Secondary | ICD-10-CM

## 2016-10-26 DIAGNOSIS — E876 Hypokalemia: Secondary | ICD-10-CM

## 2016-10-26 DIAGNOSIS — I63131 Cerebral infarction due to embolism of right carotid artery: Secondary | ICD-10-CM | POA: Diagnosis present

## 2016-10-26 DIAGNOSIS — I6310 Cerebral infarction due to embolism of unspecified precerebral artery: Secondary | ICD-10-CM

## 2016-10-26 DIAGNOSIS — I482 Chronic atrial fibrillation, unspecified: Secondary | ICD-10-CM

## 2016-10-26 DIAGNOSIS — R1310 Dysphagia, unspecified: Secondary | ICD-10-CM

## 2016-10-26 DIAGNOSIS — Z9911 Dependence on respirator [ventilator] status: Secondary | ICD-10-CM

## 2016-10-26 DIAGNOSIS — D62 Acute posthemorrhagic anemia: Secondary | ICD-10-CM

## 2016-10-26 DIAGNOSIS — Z01818 Encounter for other preprocedural examination: Secondary | ICD-10-CM

## 2016-10-26 DIAGNOSIS — J9601 Acute respiratory failure with hypoxia: Secondary | ICD-10-CM

## 2016-10-26 DIAGNOSIS — I4891 Unspecified atrial fibrillation: Secondary | ICD-10-CM

## 2016-10-26 DIAGNOSIS — R52 Pain, unspecified: Secondary | ICD-10-CM

## 2016-10-26 DIAGNOSIS — I10 Essential (primary) hypertension: Secondary | ICD-10-CM | POA: Diagnosis present

## 2016-10-26 LAB — BASIC METABOLIC PANEL
Anion gap: 6 (ref 5–15)
BUN: 14 mg/dL (ref 6–20)
CHLORIDE: 115 mmol/L — AB (ref 101–111)
CO2: 22 mmol/L (ref 22–32)
Calcium: 8 mg/dL — ABNORMAL LOW (ref 8.9–10.3)
Creatinine, Ser: 0.58 mg/dL — ABNORMAL LOW (ref 0.61–1.24)
GFR calc Af Amer: >60 mL/min (ref >60)
GFR calc non Af Amer: >60 mL/min (ref >60)
GLUCOSE: 159 mg/dL — AB (ref 65–99)
POTASSIUM: 3.3 mmol/L — AB (ref 3.5–5.1)
Sodium: 143 mmol/L (ref 135–145)

## 2016-10-26 LAB — CBC
HCT: 31.5 % — ABNORMAL LOW (ref 39.0–52.0)
HEMOGLOBIN: 10 g/dL — AB (ref 13.0–17.0)
MCH: 28.2 pg (ref 26.0–34.0)
MCHC: 31.7 g/dL (ref 30.0–36.0)
MCV: 89 fL (ref 78.0–100.0)
PLATELETS: 108 10*3/uL — AB (ref 150–400)
RBC: 3.54 MIL/uL — AB (ref 4.22–5.81)
RDW: 15 % (ref 11.5–15.5)
WBC: 6.4 10*3/uL (ref 4.0–10.5)

## 2016-10-26 LAB — GLUCOSE, CAPILLARY
GLUCOSE-CAPILLARY: 141 mg/dL — AB (ref 65–99)
Glucose-Capillary: 129 mg/dL — ABNORMAL HIGH (ref 65–99)
Glucose-Capillary: 145 mg/dL — ABNORMAL HIGH (ref 65–99)
Glucose-Capillary: 147 mg/dL — ABNORMAL HIGH (ref 65–99)
Glucose-Capillary: 158 mg/dL — ABNORMAL HIGH (ref 65–99)
Glucose-Capillary: 175 mg/dL — ABNORMAL HIGH (ref 65–99)

## 2016-10-26 LAB — ECHOCARDIOGRAM COMPLETE
HEIGHTINCHES: 67 in
WEIGHTICAEL: 3206.37 [oz_av]

## 2016-10-26 MED ORDER — PERFLUTREN LIPID MICROSPHERE
INTRAVENOUS | Status: AC
  Filled 2016-10-26: qty 10

## 2016-10-26 MED ORDER — BISACODYL 10 MG RE SUPP
10.0000 mg | Freq: Once | RECTAL | Status: AC
  Administered 2016-10-26: 10 mg via RECTAL
  Filled 2016-10-26: qty 1

## 2016-10-26 MED ORDER — PERFLUTREN LIPID MICROSPHERE
1.0000 mL | INTRAVENOUS | Status: AC | PRN
  Administered 2016-10-26: 2 mL via INTRAVENOUS
  Filled 2016-10-26: qty 10

## 2016-10-26 MED ORDER — VITAL AF 1.2 CAL PO LIQD
1000.0000 mL | ORAL | Status: DC
  Administered 2016-10-26 – 2016-10-29 (×5): 1000 mL

## 2016-10-26 MED ORDER — POTASSIUM CHLORIDE 20 MEQ/15ML (10%) PO SOLN
40.0000 meq | Freq: Three times a day (TID) | ORAL | Status: AC
  Administered 2016-10-26 (×2): 40 meq
  Filled 2016-10-26 (×2): qty 30

## 2016-10-26 NOTE — Evaluation (Signed)
Physical Therapy Evaluation Patient Details Name: Sean [name] Leazer MRN: [id number] DOB: 28-Mar-1926 Today's Date: 10/26/2016   History of Present Illness  Sean [name] [place] is a 82 y.o. male admitted with He has expressive aphasia and Not moving left side with right-sided gaze deviation. MRI: Areas of acute infarction in the right MCA territory involving the insula, frontal operculum, anteromedial temporal lobe, and lateral temporal parietal junction s/p revascularization.  Clinical Impression  Patient presents with lethargy, right gaze preference, right hemiplegia and impaired mobility s/p above. Tolerated sitting EOB ~15 minutes with total A. Pt able to get to midline with gaze and follows commands inconsistently with multimodal cues. Not able to get PLOF/history as pt unable to provide this information. May try interpreter next session to further assess cognition. Would benefit from CIR to maximize independence and mobility.    Follow Up Recommendations CIR    Equipment Recommendations  Other (comment) (tba)    Recommendations for Other Services OT consult;Rehab consult     Precautions / Restrictions Precautions Precautions: Fall Restrictions Weight Bearing Restrictions: No      Mobility  Bed Mobility   Bed Mobility: Supine to Sit;Sit to Supine     Supine to sit: Total assist;+2 for physical assistance Sit to supine: Total assist;+2 for physical assistance   General bed mobility comments: No assistance from pt.  Transfers                    Ambulation/Gait                Stairs            Wheelchair Mobility    Modified Rankin (Stroke Patients Only) Modified Rankin (Stroke Patients Only) Pre-Morbid Rankin Score: Moderately severe disability (unsure) Modified Rankin: Severe disability     Balance Overall balance assessment: Needs assistance Sitting-balance support: Feet supported;Single extremity supported Sitting balance-Leahy Scale: Zero Sitting  balance - Comments: pt pushing some with RUE, when RUE removed from surface and pt leaned to right by therapist pt presented with righting reaction by putting his RUE back on the bed.  Postural control: Left lateral lean;Posterior lean                                   Pertinent Vitals/Pain Pain Assessment: Faces Pain Location: Grimaces or moves to pain in all extremities but delayed Pain Descriptors / Indicators: Grimacing Pain Intervention(s): Monitored during session;Repositioned    Home Living Family/patient expects to be discharged to:: Unsure Living Arrangements:  (unsure)                    Prior Function           Comments: unsure     Hand Dominance   Dominant Hand:  (unsure)    Extremity/Trunk Assessment   Upper Extremity Assessment: Defer to OT evaluation       LUE Deficits / Details: Flaccid, PROM WNL, edematous   Lower Extremity Assessment: LLE deficits/detail;RLE deficits/detail RLE Deficits / Details: Not moving RLE on command; but observed it moving spontaneously. LLE Deficits / Details: Flaccid throughout; no clonus. Withdrawls to painful stimulus.     Communication   Communication: Other (comment) (top of chart says English; per note, pt does not speak [place]. Able to follow some English commands.)  Cognition Arousal/Alertness: Lethargic Behavior During Therapy: Flat affect (agitated at times during assessment.) Overall Cognitive Status: Difficult to assess Area of  Impairment: Following commands;Safety/judgement;Problem solving       Following Commands: Follows one step commands inconsistently;Follows one step commands with increased time Safety/Judgement: Decreased awareness of safety;Decreased awareness of deficits   Problem Solving: Slow processing;Decreased initiation;Difficulty sequencing;Requires verbal cues;Requires tactile cues General Comments: Followed 1 step commands inconsistently; able to give a thumbs up with  tactile and verbal cues and x1to lift up his right hand.     General Comments General comments (skin integrity, edema, etc.): BP 172/80 supine, BP 148/65 post session    Exercises     Assessment/Plan    PT Assessment Patient needs continued PT services  PT Problem List Decreased strength;Decreased mobility;Decreased range of motion;Decreased coordination;Decreased safety awareness;Impaired tone;Pain;Decreased balance;Decreased activity tolerance;Decreased cognition;Cardiopulmonary status limiting activity          PT Treatment Interventions DME instruction;Therapeutic activities;Gait training;Therapeutic exercise;Patient/family education;Balance training;Neuromuscular re-education;Functional mobility training;Cognitive remediation    PT Goals (Current goals can be found in the Care Plan section)  Acute Rehab PT Goals Patient Stated Goal: unable PT Goal Formulation: Patient unable to participate in goal setting Time For Goal Achievement: 11/09/16 Potential to Achieve Goals: Fair    Frequency Min 4X/week   Barriers to discharge   unsure    Co-evaluation PT/OT/SLP Co-Evaluation/Treatment: Yes Reason for Co-Treatment: Complexity of the patient's impairments (multi-system involvement);For patient/therapist safety PT goals addressed during session: Mobility/safety with mobility;Strengthening/ROM OT goals addressed during session: Strengthening/ROM       End of Session Equipment Utilized During Treatment: Other (comment) (vent) Activity Tolerance: Patient limited by lethargy Patient left: in bed;with call bell/phone within reach;with bed alarm set;with restraints reapplied Nurse Communication: Mobility status         Time: 0930-1001 PT Time Calculation (min) (ACUTE ONLY): 31 min   Charges:   PT Evaluation $PT Eval High Complexity: 1 Procedure     PT G Codes:        Jakaiya Netherland A Tika Hannis 10/26/2016, 1:09 PM Mylo RedShauna Puneet Masoner, PT, DPT [phone]

## 2016-10-26 NOTE — Progress Notes (Signed)
RT note-ETT found to be out at 20cm, advanced back to 24cm, BBS equal at this time, with moderate amount secretions.

## 2016-10-26 NOTE — Progress Notes (Addendum)
STROKE TEAM PROGRESS NOTE   SUBJECTIVE (INTERVAL HISTORY) No family is at the bedside today. He is still Intubated and sedated. Requiring restraint as he starts pulling out lines and ETT otherwise. . Still not moving much on the left side. Urine output remains low. However, patient vital signs stable, and labs are within normal limits.  No Cong[place]inese language interpreter available at the bedside  OBJECTIVE Temp:  [98.1 F (36.7 C)-99.7 F (37.6 C)] 99.7 F (37.6 C) (11/13 0400) Pulse Rate:  [29-78] 61 (11/13 0800) Cardiac Rhythm: Atrial fibrillation (11/13 0800) Resp:  [15-20] 15 (11/13 0800) BP: (121-172)/(58-81) 159/74 (11/13 0800) SpO2:  [99 %-100 %] 100 % (11/13 1158) Arterial Line BP: (83-166)/(57-87) 165/60 (11/13 0700) FiO2 (%):  [40 %] 40 % (11/13 1158) Weight:  [200 lb 6.4 oz (90.9 kg)] 200 lb 6.4 oz (90.9 kg) (11/13 0500)  CBC:  Recent Labs Lab 10/23/16 0345  10/24/16 0500 10/25/16 0431 10/26/16 0500  WBC 6.1  --  6.5 5.4 6.4  NEUTROABS 3.6  --  4.7  --   --   HGB 13.4  < > 9.7* 9.7* 10.0*  HCT 40.1  < > 29.7* 29.2* 31.5*  MCV 87.0  --  86.3 87.2 89.0  PLT 114*  --  109* 99* 108*  < > = values in this interval not displayed.  Basic Metabolic Panel:   Recent Labs Lab 10/25/16 0431 10/25/16 1756 10/26/16 0500  NA 143  --  143  K 3.3*  --  3.3*  CL 117*  --  115*  CO2 19*  --  22  GLUCOSE 137*  --  159*  BUN 13  --  14  CREATININE 0.71  --  0.58*  CALCIUM 7.9*  --  8.0*  MG 2.2 2.0  --   PHOS 2.8 3.1  --     Lipid Panel:     Component Value Date/Time   CHOL 104 10/24/2016 0719   TRIG 111 10/24/2016 0719   HDL 29 (L) 10/24/2016 0719   CHOLHDL 3.6 10/24/2016 0719   VLDL 22 10/24/2016 0719   LDLCALC 53 10/24/2016 0719   HgbA1c:  Lab Results  Component Value Date   HGBA1C 6.2 (H) 10/24/2016   Urine Drug Screen:     Component Value Date/Time   LABOPIA NONE DETECTED 10/23/2016 0628   COCAINSCRNUR NONE DETECTED 10/23/2016 0628   LABBENZ NONE  DETECTED 10/23/2016 0628   AMPHETMU NONE DETECTED 10/23/2016 0628   THCU NONE DETECTED 10/23/2016 0628   LABBARB NONE DETECTED 10/23/2016 0628      IMAGING I have personally reviewed the radiological images below and agree with the radiology interpretations.  DG Chest Portable - 1 View 10/25/2016 1. Stable support apparatus. 2. Increased interstitial markings on the right versus the left, more focal in the medial right base. This could represent asymmetric edema. Recommend attention on follow-up.  Ct Angio Head and Neck W Or Wo Contrast 10/23/2016 1. Occlusion of the right internal carotid artery just distal to the carotid bifurcation  2. Severely diminished opacification of the right middle cerebral artery, in keeping with acute right MCA infarct and the reported clinical left-sided symptoms.  3. No hemodynamically significant stenosis of the left carotid or vertebral arteries.   Ct Cerebral Perfusion W Contrast 10/23/2016 Motion during the perfusion scan compromises the obtained data. The calculated mismatch volume of 193 mL is likely unreliable and is not expected to provide accurate assessment of the ischemic penumbra.   Ct Head Code  Stroke W/o Cm 10/23/2016 1. No acute intracranial hemorrhage.  2. ASPECTS is 10.  No CT evidence of acute cortical infarct.   Cerebral Angiogram S/P Rt common carotid ,lt common caroptid anr RT vert artery angiograms,follwed by complete revascularization of occluded RT ICA,RT MCA and RT ACA with x 4 passes with solitaire 4mm x 40 mm retrieval device ,x 1pass with the 064 ACE aspiration catheter and 7.2 mg of INTEGRELIN   with a TICI 2B reperfusion.  MRI Head Wo Contrast 10/23/2016 1. Areas of acute infarction in the right MCA territory involving the insula, frontal operculum, anteromedial temporal lobe, and lateral temporal parietal junction. No evidence for hemorrhagic conversion. 2. Sequelae of small chronic hemorrhagic infarcts in the right  cerebellum and central distribution chronic microhemorrhage likely hypertensive in etiology. 3. Advanced chronic microvascular ischemic changes and parenchymal volume loss.  Ct Head head  10/25/2016 IMPRESSION: Progressive low-density has developed in the right insular and frontal region and in the right temporal and parietal region consistent with completed infarction in those regions. Mild swelling but no hemorrhage or shift.    EEG 10/26/16 1.  diffuse cerebral dysfunction that is non-specific in etiology and can be seen with hypoxic/ischemic injury, toxic/metabolic encephalopathies, neurodegenerative disorders, or medication effect.   2.  Structural or other physiologic abnormality in the right hemisphere, such as in setting of a stroke in that region. TTE pending   PHYSICAL EXAM  Temp:  [98.1 F (36.7 C)-99.7 F (37.6 C)] 99.7 F (37.6 C) (11/13 0400) Pulse Rate:  [29-78] 61 (11/13 0800) Resp:  [15-20] 15 (11/13 0800) BP: (121-172)/(58-81) 159/74 (11/13 0800) SpO2:  [99 %-100 %] 100 % (11/13 1158) Arterial Line BP: (83-166)/(57-87) 165/60 (11/13 0700) FiO2 (%):  [40 %] 40 % (11/13 1158) Weight:  [200 lb 6.4 oz (90.9 kg)] 200 lb 6.4 oz (90.9 kg) (11/13 0500)  General - Well nourished, well developed elderly asian male, intubated and on sedation.  Ophthalmologic - Fundi not visualized due to noncooperation.  Cardiovascular - irregularly irregular heart rate and rhythm.  Neuro - intubated, eyes closed, not following commands.-Unclear wether due to language barrierPupil unequal sizes, reactive but very sluggish. Dysconjugate gaze with R eye down and out.  Facial asymmetry cannot be evaluated due to ET tube. On pain stimulation, left UE and LE 0/5, mild withdrawal to R upper and lower ext. Sensation, coordination, and gait not tested.   ASSESSMENT/PLAN Mr. Ramond Darnell is a 82 y.o. male with history of hypertension, hyperlipidemia, and atrial fibrillation on Pradaxa presenting  with left hemiplegia and speech difficulties.Marland Kitchen He did not receive IV t-PA due to anticoagulation. Interventional Radiology for cerebral angiogram and subsequent intervention as above.  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  Resultant  Intubated, left hemiparesis. Likely aprexia of eye lid opening.   MRI - acute infarction in the right MCA territory   CTA H&N - acute right MCA infarct. Occlusion of the right ICA.  Repeat CT 10/25/2016 - evolving right MCA infarct, no hemorrhage, no midline shift  2D Echo - pending  LDL - 53  HgbA1c - 6.2 - prediabetes  VTE prophylaxis - subcutaneous heparin Diet NPO time specified  Pradaxa (dabigatran) twice a day prior to admission, now on ASA. Hold off anticoagulation due to moderately large right MCA infarct.  Ongoing aggressive stroke risk factor management  Therapy recommendations:  Pending   Disposition:  Pending  Right ICA occlusion  Likely due to A. fib noncompliance with Pradaxa  S/p mechanical thrombectomy  BP goal <180.    Chronic A. fib  On Pradaxa at home however missing doses one day PTA  Hold off Pradaxa due to moderately large right MCA infarct  Consider resume Pradaxa in one week if no procedure planned  Respiratory failure  Intubated on sedation  CCM on board  Wean off ventilation as able  Decreased urine output  Low urine output remains, will do I/o cath, if has large volume will place foley, currently has condom cath.   Normal kidney function. May consider renal u/s to look for obstructive process.   Vital signs stable  CXR no pulmonary edema  CCM on board  May consider further fluid bolus if needed  Hypertension  Stable   BP goal < 180 due to recanalization of right ICA to avoid hyperperfusion syndrome  Avoid hypotension  Hyperlipidemia  Home meds:  Niacin - not  resumed.  LDL 53, goal < 70  Nutrition  NPO  Gastric tube 10/23/2016  Feeding supplement 45 cc / hr - started 10/24/2016  Other Stroke Risk Factors  Advanced age  Other Active Problems  Hypokalemia - supplemented 3.2 -> 2.9 -> 3.3  S/p bilateral cataract surgery  Anemia - 9.7 / 29.7 -> 9.7 / 29.2  Thrombocytopenia - 109 -> 99  Hospital day # 3     Tasrif Ahmed, M.D. PGY-3  I have personally examined this patient, reviewed notes, independently viewed imaging studies, participated in medical decision making and plan of care.ROS completed by me personally and pertinent positives fully documented  I have made any additions or clarifications directly to the above note. Agree with note above.  Plan check EEG today and arrange family conference tomorrow with interpreter to discuss prognosis and plan of care-one way extubation and DNR versus full code withtrach/PEG and SNF  This patient is critically ill due to large right MCA infarct, right ICA occlusion s/p mechanical thrombectomy, A. fib, hypertension and respiratory failure and at significant risk of neurological worsening, death form recurrent infarct, hemorrhagic transformation, hypertensive urgency, heart failure and cerebral edema. This patient's care requires constant monitoring of vital signs, hemodynamics, respiratory and cardiac monitoring, review of multiple databases, neurological assessment, discussion with family, other specialists and medical decision making of high complexity. I spent 35 minutes of neurocritical care time while caring for this patient Delia HeadyPramod Sethi, MD Medical Director [facility] Pager: [phone] 10/26/2016 1:21 PM

## 2016-10-26 NOTE — Progress Notes (Signed)
RT note- Placed on wean, occupational therapy with patient at this time.

## 2016-10-26 NOTE — Progress Notes (Signed)
EEG completed; results pending.    

## 2016-10-26 NOTE — Progress Notes (Signed)
PULMONARY / CRITICAL CARE MEDICINE   Name: Sean [name] MRN: [id number] DOB: Feb 19, 1926    ADMISSION DATE:  10/23/2016 CONSULTATION DATE:  10/23/16  REFERRING MD: Dr. Ranae Palms   CHIEF COMPLAINT: Acute onset of left hemiplegia and aphasia  BRIEF SUMMARY: 82 y/o M admitted 11/10 with c/o AMS, no movement on left side, unable to speak.  Found to have R ICA occlusion / MCA CVA.  To Neuro IR, returned to ICU on vent.    SUBJECTIVE:  No events overnight, did not wake up for wake up assessment.  VITAL SIGNS: BP (!) 163/78   Pulse (!) 29   Temp 99.7 F (37.6 C) (Axillary)   Resp 16   Ht 5\' 7"  (1.702 m)   Wt 90.9 kg (200 lb 6.4 oz)   SpO2 100%   BMI 31.39 kg/m   HEMODYNAMICS:    VENTILATOR SETTINGS: Vent Mode: PRVC FiO2 (%):  [40 %] 40 % Set Rate:  [16 bmp-18 bmp] 16 bmp Vt Set:  [530 mL-610 mL] 530 mL PEEP:  [5 cmH20] 5 cmH20 Pressure Support:  [5 cmH20] 5 cmH20 Plateau Pressure:  [7 cmH20-19 cmH20] 13 cmH20  INTAKE / OUTPUT: I/O last 3 completed shifts: In: 3470.9 [I.V.:1340.9; NG/GT:1080; IV Piggyback:1050] Out: 575 [Urine:575]  PHYSICAL EXAMINATION: General: Elderly male in bed, does not open eyes to command but does withdraw right hand to pain. Neuro: does not open eyes to command but does to pain and does withdraw right hand to pain.  Gag intact. HEENT: ETT, mm pink/moist Cardiovascular: s1/s2 regular, brady Lungs: Vent assisted breaths, coarse bilaterally   Abdomen: Non tender, non distended, active bowel sounds  Musculoskeletal: no acute deformities.  Skin: Warm, dry, intact    LABS:  BMET  Recent Labs Lab 10/24/16 0500 10/25/16 0431 10/26/16 0500  NA 141 143 143  K 2.9* 3.3* 3.3*  CL 113* 117* 115*  CO2 21* 19* 22  BUN 8 13 14   CREATININE 0.68 0.71 0.58*  GLUCOSE 118* 137* 159*    Electrolytes  Recent Labs Lab 10/24/16 0500  10/24/16 1630 10/25/16 0431 10/25/16 1756 10/26/16 0500  CALCIUM 7.8*  --   --  7.9*  --  8.0*  MG 1.7  < > 2.2  2.2 2.0  --   PHOS 2.8  < > 2.7 2.8 3.1  --   < > = values in this interval not displayed.  CBC  Recent Labs Lab 10/24/16 0500 10/25/16 0431 10/26/16 0500  WBC 6.5 5.4 6.4  HGB 9.7* 9.7* 10.0*  HCT 29.7* 29.2* 31.5*  PLT 109* 99* 108*    Coag's  Recent Labs Lab 10/23/16 0345  APTT 36  INR 1.14    Sepsis Markers No results for input(s): LATICACIDVEN, PROCALCITON, O2SATVEN in the last 168 hours.  ABG  Recent Labs Lab 10/23/16 1533  PHART 7.493*  PCO2ART 28.2*  PO2ART 174*   Liver Enzymes  Recent Labs Lab 10/23/16 0345  AST 23  ALT 13*  ALKPHOS 49  BILITOT 0.5  ALBUMIN 3.6   Cardiac Enzymes No results for input(s): TROPONINI, PROBNP in the last 168 hours.  Glucose  Recent Labs Lab 10/25/16 0741 10/25/16 1141 10/25/16 1549 10/25/16 1930 10/25/16 2331 10/26/16 0324  GLUCAP 128* 147* 116* 140* 139* 129*   Imaging Ct Head Wo Contrast  Result Date: 10/25/2016 CLINICAL DATA:  Acute onset of left hemiplegia and aphasia. Re- vascularized right ICA. EXAM: CT HEAD WITHOUT CONTRAST TECHNIQUE: Contiguous axial images were obtained from the base of the  skull through the vertex without intravenous contrast. COMPARISON:  CT 10/23/2016.  MRI 10/23/2016. FINDINGS: Brain: Progressive low-density in the right insular and frontal region and in the right temporal and parietal region consistent with completed infarction in those areas. Mild swelling but no evidence of hemorrhage or shift. Old infarctions affecting the cerebellum in cerebral hemispheric white matter appear the same. No sign of new acute infarction. No hydrocephalus or extra-axial collection. Vascular: There is atherosclerotic calcification of the major vessels at the base of the brain. Skull: Negative Sinuses/Orbits: Mucosal inflammatory changes of the maxillary and ethmoid sinuses. Other: None significant IMPRESSION: Progressive low-density has developed in the right insular and frontal region and in the  right temporal and parietal region consistent with completed infarction in those regions. Mild swelling but no hemorrhage or shift. Electronically Signed   By: Paulina FusiMark  Shogry M.D.   On: 10/25/2016 11:20   STUDIES:  -CTA Head and Neck 11/10 > Occlusion of the right internal carotid artery just distal to the carotid bifurcation. Severely diminished opacification of the right middle cerebral artery.  -CT Head 11/10 >Mild patchy areas of increased density in the right cerebellum,right hippocampus, and posterior limb internal capsule the right  MRI 11/10 >> areas of acute infarct in the R MCA territory, no hemorrhagic conversion, likely hypertensive chronic micro hemorrhage in the right cerebellum  -ECHO 11/11 >>   CULTURES:  ANTIBIOTICS:  SIGNIFICANT EVENTS: 11/10  Admit, IR for  Cerebral angiogram s/p middle cerebral artery embolism. 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 Taken to IR for recanalization of right ICA.    LINES/TUBES: ET tube 11/10 >> Left Radial Aline 11/10 >> R Femoral Sheath 11/10 >> 11/11  DISCUSSION: 82 year old male presents to ED 11/10 with acute onset left hemiplegia and asphasia.  CTA revealed occlusion of the right internal carotid artery and right MCA. Went to IR for revascularization. Returned to ICU on vent and sedated.   ASSESSMENT / PLAN:  PULMONARY A: Acute Respiratory Failure secondary to CVA  P:   - PRVC 8 cc/kg, reduce rate to 16. - Begin PS trials but no extubation given mental status. - intermittent CXR. - Daily SBT, but no extubation given mental status. - Titrate O2 for sat of 88-92%.  CARDIOVASCULAR A:  H/O Afib, HTN HLD P:  - Awaiting ECHO - Keep Systolic 120-160 - Labetalol / hydralazine PRN for systolic > 120-160 - Hold home Pradaxa, Lisinopril, and Niacin  - Cardiac monitoring  - ICU hemodynamic monitoring   RENAL A:    Hypokalemia  Oliguria - 11/12 P:   - Replace electrolytes as needed  - Strick I & O - Daily BMP  - NS to 50 ml/hr  GASTROINTESTINAL A:   R/O dysphagia  P:   - PPI - NPO  - Will need swallow eval after extubated  - OGT - Continue TF  HEMATOLOGIC A:   Thrombocytopenia - unknown baseline, on chronic anticoagulation  Anemia  P:  - Trend CBC - Hold home Pradaxa  - SCDs   INFECTIOUS A: No issues.  P:   - Trend WBC and fever curve   ENDOCRINE A:   Hyperglycemia  P:   - SSI - Q4H Glucose checks   NEUROLOGIC A:   Right MCA/ACA s/p middle cerebral artery embolism  P:   - Per Neurology  - Repeat CT pending - Frequent Neuro Exams  -  PRN Fentanyl  - D/c propofol to allow for neuro exam  - RASS goal: 0    FAMILY  - Updates: no family at beside 11/13 am  - Inter-disciplinary family meet or Palliative Care meeting due by:  10/30/2016  The patient is critically ill with multiple organ systems failure and requires high complexity decision making for assessment and support, frequent evaluation and titration of therapies, application of advanced monitoring technologies and extensive interpretation of multiple databases.   Critical Care Time devoted to patient care services described in this note is  35  Minutes. This time reflects time of care of this signee Dr Koren BoundWesam Royer Cristobal. This critical care time does not reflect procedure time, or teaching time or supervisory time of PA/NP/Med student/Med Resident etc but could involve care discussion time.  Alyson ReedyWesam G. Aerilynn Goin, M.D. [facility]. Pager: [phone]. After hours pager: [phone].

## 2016-10-26 NOTE — Procedures (Signed)
ELECTROENCEPHALOGRAM REPORT  Date of Study: 10/26/2016  Patient's Name: Sean [name] MRN: [id number] Date of Birth: 06-11-1926  Referring Provider: Dellia Nims, MD  Clinical History: 82 year old man with large right MCA infarct, right ICA occlusion s/p mechanical thrombectomy, A. fib, hypertension and respiratory failure   Medications:  0.9 % sodium chloride infusion   aspirin tablet 325 mg   chlorhexidine gluconate (MEDLINE KIT) (PERIDEX) 0.12 % solution 15 mL   famotidine (PEPCID) IVPB 20 mg premix   feeding supplement (VITAL AF 1.2 CAL) liquid 1,000 mL   fentaNYL (SUBLIMAZE) injection 100 mcg   fentaNYL (SUBLIMAZE) injection 100 mcg   heparin injection 5,000 Units   insulin aspart (novoLOG) injection 2-6 Units   MEDLINE mouth rinse   potassium chloride 20 MEQ/15ML (10%) solution 40 mEq   senna-docusate (Senokot-S) tablet 1 tablet   Technical Summary: A multichannel digital EEG recording measured by the international 10-20 system with electrodes applied with paste and impedances below 5000 ohms performed as portable with EKG monitoring in an awake and intubated patient who is currently not on sedation.  Hyperventilation and photic stimulation were not performed.  The digital EEG was referentially recorded, reformatted, and digitally filtered in a variety of bipolar and referential montages for optimal display.   Description: The patient is awake and intubated, but not sedated, during the recording.  During maximal wakefulness, there is a asymmetric, low voltage 8 Hz posterior dominant rhythm on the right that attenuates with eye opening. This is admixed with 4-5 Hz theta slowing of the waking background.  There is superimposed focal low-amplitude 4-5 Hz theta and 2-3 Hz delta slowing and background suppression in right hemisphere.  Stage 2 sleep is not seen.  There were no epileptiform discharges or electrographic seizures seen.    EKG lead was unremarkable.  Impression: This awake  and intubated EEG is abnormal due to:  1.  diffuse slowing of the waking background. 2.  Focal right hemispheric slowing  Clinical Correlation of the above findings indicates:  1.  diffuse cerebral dysfunction that is non-specific in etiology and can be seen with hypoxic/ischemic injury, toxic/metabolic encephalopathies, neurodegenerative disorders, or medication effect.   2.  Structural or other physiologic abnormality in the right hemisphere, such as in setting of a stroke in that region.  The absence of epileptiform discharges does not rule out a clinical diagnosis of epilepsy.  Clinical correlation is advised.  Metta Clines, DO

## 2016-10-26 NOTE — Evaluation (Signed)
Occupational Therapy Evaluation Patient Details Name: Sean [name] Longley MRN: [id number] DOB: 20-Jul-1926 Today's Date: 10/26/2016    History of Present Illness Sean [name] Kriegel is a 82 y.o. male admitted with He has expressive aphasia and Not moving left side with right-sided gaze deviation. MRI: Areas of acute infarction in the right MCA territory involving the insula, frontal operculum, anteromedial temporal lobe, and lateral temporal parietal junction s/p revascularization.   Clinical Impression   This 82 yo male admitted with above presents to acute OT with deficits below (see OT problem list) thus affecting what is to be his PLOF to be Independent. He will benefit from acute OT with follow up OT on CIR to increase independence as much as possible and decrease burden of care on family. See PT note for vital signs.   Follow Up Recommendations  CIR;Supervision/Assistance - 24 hour    Equipment Recommendations  Other (comment) (TBD next venue)       Precautions / Restrictions Precautions Precautions: Fall Restrictions Weight Bearing Restrictions: No      Mobility Bed Mobility   Bed Mobility: Supine to Sit;Sit to Supine     Supine to sit: Total assist;+2 for physical assistance Sit to supine: Total assist;+2 for physical assistance   General bed mobility comments: No assistance from pt  Transfers                      Balance Overall balance assessment: Needs assistance Sitting-balance support: Single extremity supported;Feet supported Sitting balance-Leahy Scale: Zero Sitting balance - Comments: pt pushing some with RUE, when RUE removed from surface and pt leaned to right by therapist pt presented with righting reaction by putting his RUE back on the bed Postural control: Left lateral lean;Posterior lean                                  ADL                                         General ADL Comments: total A at present     Vision  Additional Comments: unable to assess due to not keeping eyes open for any period of time and not consistently follow commands. Eyes mostly deviated to right, but will come to midline with left eye (the one that would open)          Pertinent Vitals/Pain Pain Assessment: Faces Pain Location: Grimaces or moved to pain on all 4 extremities (but delayed)     Hand Dominance  (unsure)   Extremity/Trunk Assessment Upper Extremity Assessment Upper Extremity Assessment: LUE deficits/detail LUE Deficits / Details: Flaccid, PROM WNL, edematous LUE Coordination: decreased fine motor;decreased gross motor           Communication Communication Communication:  (ET tube, top of chart says English, note says does not speak Englis (pt did respond to some English commands))   Cognition Arousal/Alertness: Lethargic Behavior During Therapy: Flat affect (agitated at times) Overall Cognitive Status: Impaired/Different from baseline Area of Impairment: Following commands;Safety/judgement;Problem solving       Following Commands: Follows one step commands inconsistently;Follows one step commands with increased time Safety/Judgement: Decreased awareness of safety;Decreased awareness of deficits   Problem Solving: Slow processing;Decreased initiation;Difficulty sequencing;Requires verbal cues;Requires tactile cues General Comments: Followed 1 step commands once for lift up your right hand (  with tactile cues too), and thumbs up (tactile cue as well).               Home Living Family/patient expects to be discharged to:: Unsure Living Arrangements:  (unsure)                                      Prior Functioning/Environment          Comments: unsure        OT Problem List: Decreased strength;Decreased range of motion;Decreased activity tolerance;Impaired balance (sitting and/or standing);Impaired vision/perception;Impaired UE functional use;Decreased knowledge of use of  DME or AE;Decreased safety awareness;Impaired tone;Decreased cognition;Decreased coordination;Increased edema;Decreased knowledge of precautions   OT Treatment/Interventions: Self-care/ADL training;Therapeutic activities;Therapeutic exercise;Cognitive remediation/compensation;Neuromuscular education;Visual/perceptual remediation/compensation;Patient/family education;Balance training;DME and/or AE instruction    OT Goals(Current goals can be found in the care plan section) Acute Rehab OT Goals Patient Stated Goal: unable OT Goal Formulation: Patient unable to participate in goal setting Potential to Achieve Goals: Fair  OT Frequency: Min 3X/week           Co-evaluation PT/OT/SLP Co-Evaluation/Treatment: Yes Reason for Co-Treatment: Complexity of the patient's impairments (multi-system involvement);For patient/therapist safety   OT goals addressed during session: Strengthening/ROM      End of Session Equipment Utilized During Treatment:  (None)  Activity Tolerance: Patient limited by lethargy Patient left: in bed;with restraints reapplied (mitt and right UE wrist)   Time: 0930-1000 OT Time Calculation (min): 30 min Charges:  OT General Charges $OT Visit: 1 Procedure OT Evaluation $OT Eval High Complexity: 1 Procedure  Evette GeorgesLeonard, Justin Meisenheimer Eva [phone] 10/26/2016, 11:52 AM

## 2016-10-26 NOTE — Progress Notes (Signed)
Please see consult note.  

## 2016-10-26 NOTE — Progress Notes (Signed)
Nutrition Follow-up  INTERVENTION:   - Vital AF 1.2 @ 65 mL/hr (1560 mL) to provide 1872 kcal (100% estimated needs), 117 grams protein (102%) protein needs, and 1264 mL free water.  NUTRITION DIAGNOSIS:   Inadequate oral intake related to inability to eat as evidenced by NPO status.  Ongoing.  GOAL:   Provide needs based on ASPEN/SCCM guidelines  Met with updated TF regimen.  MONITOR:   Diet advancement, Vent status, Labs, I & O's, Weight trends, TF tolerance  ASSESSMENT:   Pt admitted with acute onset of aphasia and left hemiplegia with occluded RT ICA, RT MCA, and RT ACA with complete revasuclaraztion.  PT with pt doing wake-up assessment at time of visit. TF off at time of visit. Vital AF 1.2 hung at bedside. Noted MD note stating extubation today unlikely due to pt's mental status. Suspect recent weight gain related to fluid shifts (+6 L since admission). Will update TF recommendations since propofol d/c'd.  Patient is currently intubated on ventilator support with OG tube tip and side port below the diaphragm. Mv: 9.5 L/min Highest temperature in 24 hours: 37.6 degrees Celsius  Medications reviewed and include 20 mg Pepcid daily, 5,000 units heparin TID, sliding scale Novolog, potassium chloride, PRN Senokot  Labs reviewed and include low potassium (3.3 mmol/L), low calcium (8.0 mmol/L) CBG's: 116-158 mg/dL  Diet Order:  Diet NPO time specified  Skin:  Reviewed, no issues (incision)  Last BM:  Unknown  Height:   Ht Readings from Last 1 Encounters:  10/25/16 5' 7"  (1.702 m)    Weight:   Wt Readings from Last 1 Encounters:  10/26/16 200 lb 6.4 oz (90.9 kg)    Ideal Body Weight:  80.9 kg  BMI:  Body mass index is 31.39 kg/m.  Estimated Nutritional Needs:   Kcal:  1875 kcals  Protein:  100-115 g (1/2-1.4 g/kg bw)  Fluid:  >1.9 L daily   EDUCATION NEEDS:   No education needs identified at this time  Jeb Levering Dietetic Intern Pager  Number: [phone]

## 2016-10-26 NOTE — Progress Notes (Signed)
Rehab Admissions Coordinator Note:  Patient was screened by Trish MageLogue, Jalon Squier M for appropriateness for an Inpatient Acute Rehab Consult.  At this time, we are recommending Inpatient Rehab consult.  Trish MageLogue, Andreal Vultaggio M 10/26/2016, 12:19 PM  I can be reached at [phone].

## 2016-10-26 NOTE — Consult Note (Signed)
Physical Medicine and Rehabilitation Consult  Reason for Consult: Right hemiplegia  HPI: Sean [name] is a 82 y.o. male with history of HTN, Afib who was admitted on 10/23/16 after found on floor by family with left sided weakness and inability to speak.  History taken from chart review. CTA head/neck done revealing occlusion of R-ICA just distal to carotid bifurcation and severely diminished opacification of R-MCA. He underwent complete revascularization of occluded R-ICA, R-MCA and R-ACA with four passes solitaire, IF integrelin with TICI 2B reperfusion.  MRI brain done post procedure and showed areas of acute infarction in the right MCA territory involving the insula, frontal operculum, anteromedial temporal lobe, and lateral temporal parietal junction as well as advanced chronic microvascular changes with parenchymal volume loss. No hemorrhage noted. EEG with diffuse slowing but not seizure activity.  2D echo done revealing EF 55-60%, moderate TVR and left atrium mildly dilated.  Oliguria treated with fluid bolus and he had difficulty with vent wean, continues to require support. Pt also in restraints due to agitation.   Review of Systems  Unable to perform ROS: Intubated   Past Medical History:  Diagnosis Date  . Atrial fibrillation (HCC)   . Hyperlipidemia   . Hypertension    History reviewed. No pertinent surgical history.related to stroke. No family history on file. Social History:  reports that he quit smoking about 15 years ago. He has never used smokeless tobacco. His alcohol and drug histories are not on file. Allergies: No Known Allergies Medications Prior to Admission  Medication Sig Dispense Refill  . ibuprofen (ADVIL,MOTRIN) 200 MG tablet Take 400 mg by mouth every 6 (six) hours as needed for headache (pain).    Marland Kitchen lisinopril-hydrochlorothiazide (PRINZIDE,ZESTORETIC) 20-25 MG tablet Take 1 tablet by mouth daily.    . niacin 500 MG tablet Take 500 mg by mouth daily.    .  dabigatran (PRADAXA) 150 MG CAPS capsule Take 150 mg by mouth 2 (two) times daily.      Home: Home Living Family/patient expects to be discharged to:: Unsure Living Arrangements:  (unsure)  Functional History: Prior Function Comments: unsure Functional Status:  Mobility: Bed Mobility Bed Mobility: Supine to Sit, Sit to Supine Supine to sit: Total assist, +2 for physical assistance Sit to supine: Total assist, +2 for physical assistance General bed mobility comments: No assistance from pt        ADL: ADL General ADL Comments: total A at present  Cognition: Cognition Overall Cognitive Status: Impaired/Different from baseline Orientation Level: Intubated/Tracheostomy - Unable to assess Cognition Arousal/Alertness: Lethargic Behavior During Therapy: Flat affect (agitated at times) Overall Cognitive Status: Impaired/Different from baseline Area of Impairment: Following commands, Safety/judgement, Problem solving Following Commands: Follows one step commands inconsistently, Follows one step commands with increased time Safety/Judgement: Decreased awareness of safety, Decreased awareness of deficits Problem Solving: Slow processing, Decreased initiation, Difficulty sequencing, Requires verbal cues, Requires tactile cues General Comments: Followed 1 step commands once for lift up your right hand (with tactile cues too), and thumbs up (tactile cue as well).   Blood pressure (!) 159/74, pulse 61, temperature 98.8 F (37.1 C), temperature source Axillary, resp. rate 15, height 5\' 7"  (1.702 m), weight 90.9 kg (200 lb 6.4 oz), SpO2 100 %. Physical Exam  Vitals reviewed. Constitutional: He appears well-developed.  Obese  HENT:  Head: Normocephalic.  Periorbital hematoma  Cardiovascular:  Irregularly irregular  Respiratory: No respiratory distress. He has no wheezes.  +Vent  GI: Soft. Bowel sounds are normal.  Musculoskeletal: He exhibits edema.  Unable to assess tenderness    Neurological:  Sedated +Restraints DTRs symmetric Per report, pt moves RUE to command, wiggles right toes, spontaneously moved LLE, and has no movement in LUE No increase in tone noted  Skin: Skin is warm and dry.  Facial hematoma  Psychiatric:  Unable to assess due to sedation    Results for orders placed or performed during the hospital encounter of 10/23/16 (from the past 24 hour(s))  Glucose, capillary     Status: Abnormal   Collection Time: 10/25/16  3:49 PM  Result Value Ref Range   Glucose-Capillary 116 (H) 65 - 99 mg/dL  Magnesium     Status: None   Collection Time: 10/25/16  5:56 PM  Result Value Ref Range   Magnesium 2.0 1.7 - 2.4 mg/dL  Phosphorus     Status: None   Collection Time: 10/25/16  5:56 PM  Result Value Ref Range   Phosphorus 3.1 2.5 - 4.6 mg/dL  Glucose, capillary     Status: Abnormal   Collection Time: 10/25/16  7:30 PM  Result Value Ref Range   Glucose-Capillary 140 (H) 65 - 99 mg/dL  Glucose, capillary     Status: Abnormal   Collection Time: 10/25/16 11:31 PM  Result Value Ref Range   Glucose-Capillary 139 (H) 65 - 99 mg/dL  Glucose, capillary     Status: Abnormal   Collection Time: 10/26/16  3:24 AM  Result Value Ref Range   Glucose-Capillary 129 (H) 65 - 99 mg/dL  CBC     Status: Abnormal   Collection Time: 10/26/16  5:00 AM  Result Value Ref Range   WBC 6.4 4.0 - 10.5 K/uL   RBC 3.54 (L) 4.22 - 5.81 MIL/uL   Hemoglobin 10.0 (L) 13.0 - 17.0 g/dL   HCT 16.131.5 (L) 09.639.0 - 04.552.0 %   MCV 89.0 78.0 - 100.0 fL   MCH 28.2 26.0 - 34.0 pg   MCHC 31.7 30.0 - 36.0 g/dL   RDW 40.915.0 81.111.5 - 91.415.5 %   Platelets 108 (L) 150 - 400 K/uL  Basic metabolic panel     Status: Abnormal   Collection Time: 10/26/16  5:00 AM  Result Value Ref Range   Sodium 143 135 - 145 mmol/L   Potassium 3.3 (L) 3.5 - 5.1 mmol/L   Chloride 115 (H) 101 - 111 mmol/L   CO2 22 22 - 32 mmol/L   Glucose, Bld 159 (H) 65 - 99 mg/dL   BUN 14 6 - 20 mg/dL   Creatinine, Ser 7.820.58 (L) 0.61  - 1.24 mg/dL   Calcium 8.0 (L) 8.9 - 10.3 mg/dL   GFR calc non Af Amer >60 >60 mL/min   GFR calc Af Amer >60 >60 mL/min   Anion gap 6 5 - 15  Glucose, capillary     Status: Abnormal   Collection Time: 10/26/16  8:07 AM  Result Value Ref Range   Glucose-Capillary 158 (H) 65 - 99 mg/dL   Comment 1 Notify RN    Comment 2 Document in Chart   Glucose, capillary     Status: Abnormal   Collection Time: 10/26/16 11:51 AM  Result Value Ref Range   Glucose-Capillary 141 (H) 65 - 99 mg/dL   Comment 1 Notify RN    Comment 2 Document in Chart    Ct Head Wo Contrast  Result Date: 10/25/2016 CLINICAL DATA:  Acute onset of left hemiplegia and aphasia. Re- vascularized right ICA. EXAM: CT HEAD WITHOUT  CONTRAST TECHNIQUE: Contiguous axial images were obtained from the base of the skull through the vertex without intravenous contrast. COMPARISON:  CT 10/23/2016.  MRI 10/23/2016. FINDINGS: Brain: Progressive low-density in the right insular and frontal region and in the right temporal and parietal region consistent with completed infarction in those areas. Mild swelling but no evidence of hemorrhage or shift. Old infarctions affecting the cerebellum in cerebral hemispheric white matter appear the same. No sign of new acute infarction. No hydrocephalus or extra-axial collection. Vascular: There is atherosclerotic calcification of the major vessels at the base of the brain. Skull: Negative Sinuses/Orbits: Mucosal inflammatory changes of the maxillary and ethmoid sinuses. Other: None significant IMPRESSION: Progressive low-density has developed in the right insular and frontal region and in the right temporal and parietal region consistent with completed infarction in those regions. Mild swelling but no hemorrhage or shift. Electronically Signed   By: Paulina Fusi M.D.   On: 10/25/2016 11:20   Dg Chest Port 1 View  Result Date: 10/25/2016 CLINICAL DATA:  Acute respiratory failure. EXAM: PORTABLE CHEST 1 VIEW  COMPARISON:  October 23, 2016 FINDINGS: The ETT is in good position. The NG tube terminates below today's film. No pneumothorax. Probable atelectasis in the left base obscuring the left hemidiaphragm. Increased interstitial markings on the right, more focal in the right base. No other interval changes. IMPRESSION: 1. Stable support apparatus. 2. Increased interstitial markings on the right versus the left, more focal in the medial right base. This could represent asymmetric edema. Recommend attention on follow-up. Electronically Signed   By: Gerome Sam III M.D   On: 10/25/2016 07:17    Assessment/Plan: Diagnosis:  Right MCA territory Labs and images independently reviewed.  Records reviewed and summated above. Stroke: Continue secondary stroke prophylaxis and Risk Factor Modification listed below:   Antiplatelet therapy:   Blood Pressure Management:  Continue current medication with prn's with permisive HTN per primary team Statin Agent:   Prediabetes management:   Left sided hemiparesis: fit for orthosis to prevent contractures (resting hand splint for day, wrist cock up splint at night, PRAFO, etc) Motor recovery: Fluoxetine  1. Does the need for close, 24 hr/day medical supervision in concert with the patient's rehab needs make it unreasonable for this patient to be served in a less intensive setting? Yes  2. Co-Morbidities requiring supervision/potential complications: chronic a fib (cont meds, monitor HR with increased activity), NPO/dysphagia (advance diet as tolerated), hypokalemia (continue to monitor and replete as necessary), ABLA (transfuse if necessary to ensure appropriate perfusion for increased activity tolerance), Thrombocytopenia (< 60,000/mm3 no resistive exercise), HTN (monitor and provide prns in accordance with increased physical exertion and pain), bradycardia (monitor HR with increased activity), pain (Biofeedback training with therapies to help reduce reliance on opiate  pain medications, particularly IV fentanly, monitor pain control during therapies, and sedation at rest and titrate to maximum efficacy to ensure participation and gains in therapies) 3. Due to bladder management, bowel management, safety, skin/wound care, disease management, medication administration, pain management and patient education, does the patient require 24 hr/day rehab nursing? Yes 4. Does the patient require coordinated care of a physician, rehab nurse, PT (1-2 hrs/day, 5 days/week), OT (1-2 hrs/day, 5 days/week) and SLP (1-2 hrs/day, 5 days/week) to address physical and functional deficits in the context of the above medical diagnosis(es)? Yes Addressing deficits in the following areas: balance, endurance, locomotion, strength, transferring, bowel/bladder control, bathing, dressing, feeding, grooming, toileting, cognition, speech, language, swallowing and psychosocial support 5. Can the  patient actively participate in an intensive therapy program of at least 3 hrs of therapy per day at least 5 days per week? Not at present 6. The potential for patient to make measurable gains while on inpatient rehab is excellent 7. Anticipated functional outcomes upon discharge from inpatient rehab are min assist and mod assist  with PT, min assist and mod assist with OT, modified independent and supervision with SLP. 8. Estimated rehab length of stay to reach the above functional goals is: 20-24 days. 9. Does the patient have adequate social supports and living environment to accommodate these discharge functional goals? Potentially 10. Anticipated D/C setting: Home 11. Anticipated post D/C treatments: HH therapy and Home excercise program 12. Overall Rehab/Functional Prognosis: good  RECOMMENDATIONS: This patient's condition is appropriate for continued rehabilitative care in the following setting: Likely CIR, however, will need to inquire about caregiver support once pt more medically stable and  appropriate for rehab. Patient has agreed to participate in recommended program. Potentially Note that insurance prior authorization may be required for reimbursement for recommended care.  Comment: Rehab Admissions Coordinator to follow up.  Maryla MorrowAnkit Alezander Dimaano, MD, [place] DomFAAPMR 10/26/2016

## 2016-10-27 ENCOUNTER — Inpatient Hospital Stay (HOSPITAL_COMMUNITY): Payer: Medicare (Managed Care)

## 2016-10-27 ENCOUNTER — Encounter (HOSPITAL_COMMUNITY): Payer: Self-pay | Admitting: Interventional Radiology

## 2016-10-27 LAB — GLUCOSE, CAPILLARY
GLUCOSE-CAPILLARY: 156 mg/dL — AB (ref 65–99)
Glucose-Capillary: 168 mg/dL — ABNORMAL HIGH (ref 65–99)
Glucose-Capillary: 155 mg/dL — ABNORMAL HIGH (ref 65–99)
Glucose-Capillary: 139 mg/dL — ABNORMAL HIGH (ref 65–99)
Glucose-Capillary: 171 mg/dL — ABNORMAL HIGH (ref 65–99)
Glucose-Capillary: 151 mg/dL — ABNORMAL HIGH (ref 65–99)

## 2016-10-27 LAB — BLOOD GAS, ARTERIAL
Acid-base deficit: 2 mmol/L (ref 0.0–2.0)
Bicarbonate: 21.5 mmol/L (ref 20.0–28.0)
DRAWN BY: 41977
FIO2: 40
LHR: 16 {breaths}/min
O2 SAT: 98 %
PCO2 ART: 32 mmHg (ref 32.0–48.0)
PEEP/CPAP: 5 cmH2O
PO2 ART: 111 mmHg — AB (ref 83.0–108.0)
Patient temperature: 98.6
VT: 530 mL
pH, Arterial: 7.443 (ref 7.350–7.450)

## 2016-10-27 LAB — BASIC METABOLIC PANEL
Anion gap: 5 (ref 5–15)
BUN: 15 mg/dL (ref 6–20)
CALCIUM: 8 mg/dL — AB (ref 8.9–10.3)
CO2: 22 mmol/L (ref 22–32)
CREATININE: 0.62 mg/dL (ref 0.61–1.24)
Chloride: 116 mmol/L — ABNORMAL HIGH (ref 101–111)
Glucose, Bld: 180 mg/dL — ABNORMAL HIGH (ref 65–99)
Potassium: 3.9 mmol/L (ref 3.5–5.1)
SODIUM: 143 mmol/L (ref 135–145)

## 2016-10-27 LAB — CBC
HCT: 31 % — ABNORMAL LOW (ref 39.0–52.0)
Hemoglobin: 10 g/dL — ABNORMAL LOW (ref 13.0–17.0)
MCH: 28.8 pg (ref 26.0–34.0)
MCHC: 32.3 g/dL (ref 30.0–36.0)
MCV: 89.3 fL (ref 78.0–100.0)
PLATELETS: 97 10*3/uL — AB (ref 150–400)
RBC: 3.47 MIL/uL — ABNORMAL LOW (ref 4.22–5.81)
RDW: 15.5 % (ref 11.5–15.5)
WBC: 6.2 10*3/uL (ref 4.0–10.5)

## 2016-10-27 LAB — PHOSPHORUS: PHOSPHORUS: 2.9 mg/dL (ref 2.5–4.6)

## 2016-10-27 LAB — MAGNESIUM: MAGNESIUM: 2 mg/dL (ref 1.7–2.4)

## 2016-10-27 MED ORDER — FUROSEMIDE 10 MG/ML IJ SOLN
40.0000 mg | Freq: Three times a day (TID) | INTRAMUSCULAR | Status: AC
  Administered 2016-10-27 (×2): 40 mg via INTRAVENOUS
  Filled 2016-10-27 (×2): qty 4

## 2016-10-27 MED ORDER — POTASSIUM PHOSPHATES 15 MMOLE/5ML IV SOLN
30.0000 mmol | Freq: Once | INTRAVENOUS | Status: AC
  Administered 2016-10-27: 30 mmol via INTRAVENOUS
  Filled 2016-10-27: qty 10

## 2016-10-27 MED ORDER — BISACODYL 10 MG RE SUPP
10.0000 mg | Freq: Every day | RECTAL | Status: DC | PRN
  Administered 2016-10-30: 10 mg via RECTAL
  Filled 2016-10-27: qty 1

## 2016-10-27 NOTE — Care Management Important Message (Signed)
Important Message  Patient Details  Name: Sean [name] MRN: [id number] Date of Birth: 1926-08-19   Medicare Important Message Given:  Yes    Kyla BalzarineShealy, Trayven Lumadue Abena 10/27/2016, 8:44 AM

## 2016-10-27 NOTE — Progress Notes (Signed)
PULMONARY / CRITICAL CARE MEDICINE   Name: Sean [name] MRN: [id number] DOB: [birth year]    ADMISSION DATE:  10/23/2016 CONSULTATION DATE:  10/23/16  REFERRING MD: Dr. Ranae Palms   CHIEF COMPLAINT: Acute onset of left hemiplegia and aphasia  BRIEF SUMMARY: 82 y/o M admitted 11/10 with c/o AMS, no movement on left side, unable to speak.  Found to have R ICA occlusion / MCA CVA.  To Neuro IR, returned to ICU on vent.    SUBJECTIVE: Does not speak english only Mandrin, no events overnight.  VITAL SIGNS: BP (!) 141/68   Pulse 60   Temp 97.4 F (36.3 C) (Axillary)   Resp 13   Ht 5\' 7"  (1.702 m)   Wt 203 lb 11.3 oz (92.4 kg)   SpO2 100%   BMI 31.90 kg/m   HEMODYNAMICS:    VENTILATOR SETTINGS: Vent Mode: PSV;CPAP FiO2 (%):  [40 %] 40 % Set Rate:  [16 bmp] 16 bmp Vt Set:  [530 mL] 530 mL PEEP:  [5 cmH20] 5 cmH20 Pressure Support:  [5 cmH20-10 cmH20] 10 cmH20 Plateau Pressure:  [12 cmH20-16 cmH20] 12 cmH20  INTAKE / OUTPUT: I/O last 3 completed shifts: In: 3890 [I.V.:1800; NG/GT:2040; IV Piggyback:50] Out: 1100 [Urine:1100]  PHYSICAL EXAMINATION: General: Elderly male in bed, does not open eyes to voice but does withdraw right hand to pain.Does not speak english Neuro: does not open eyes to command but does to pain and does withdraw right hand to pain.  Gag intact. HEENT: ETT, mm pink/moist Cardiovascular: s1/s2 regular, brady Lungs: Vent assisted breaths, coarse bilaterally   Abdomen: Non tender, non distended, active bowel sounds  Musculoskeletal: no acute deformities.  Skin: Warm, dry, intact  LABS:  BMET  Recent Labs Lab 10/25/16 0431 10/26/16 0500 10/27/16 0250  NA 143 143 143  K 3.3* 3.3* 3.9  CL 117* 115* 116*  CO2 19* 22 22  BUN 13 14 15   CREATININE 0.71 0.58* 0.62  GLUCOSE 137* 159* 180*    Electrolytes  Recent Labs Lab 10/25/16 0431 10/25/16 1756 10/26/16 0500 10/27/16 0250  CALCIUM 7.9*  --  8.0* 8.0*  MG 2.2 2.0  --  2.0  PHOS 2.8 3.1   --  2.9    CBC  Recent Labs Lab 10/25/16 0431 10/26/16 0500 10/27/16 0250  WBC 5.4 6.4 6.2  HGB 9.7* 10.0* 10.0*  HCT 29.2* 31.5* 31.0*  PLT 99* 108* 97*    Coag's  Recent Labs Lab 10/23/16 0345  APTT 36  INR 1.14    Sepsis Markers No results for input(s): LATICACIDVEN, PROCALCITON, O2SATVEN in the last 168 hours.  ABG  Recent Labs Lab 10/23/16 1533 10/27/16 0401  PHART 7.493* 7.443  PCO2ART 28.2* 32.0  PO2ART 174* 111*   Liver Enzymes  Recent Labs Lab 10/23/16 0345  AST 23  ALT 13*  ALKPHOS 49  BILITOT 0.5  ALBUMIN 3.6   Cardiac Enzymes No results for input(s): TROPONINI, PROBNP in the last 168 hours.  Glucose  Recent Labs Lab 10/26/16 1151 10/26/16 1601 10/26/16 2053 10/26/16 2355 10/27/16 0334 10/27/16 0825  GLUCAP 141* 145* 175* 147* 168* 155*   Imaging Dg Chest Port 1 View  Result Date: 10/27/2016 CLINICAL DATA:  82 year old male status post emergent right ICA occlusion and neuro-intervention for reperfusion. Initial encounter. EXAM: PORTABLE CHEST 1 VIEW COMPARISON:  Portable chest 10/25/2016 and earlier. FINDINGS: Portable AP semi upright view at 0551 hours. Endotracheal tube tip in good position between the level the clavicles and carina. Enteric tube  courses to the left upper quadrant, tip not included. Interval improved left lung volume and left lung base ventilation with improved visualization of the left hemidiaphragm. Stable cardiac size and mediastinal contours. Pulmonary vascular congestion without overt edema. No pneumothorax or pleural effusion identified. IMPRESSION: 1.  Stable lines and tubes. 2. Interval improved left lung base ventilation with mild residual atelectasis or consolidation. 3. Pulmonary vascular congestion without overt edema. Electronically Signed   By: Odessa FlemingH  Hall M.D.   On: 10/27/2016 07:34   STUDIES:  -CTA Head and Neck 11/10 > Occlusion of the right internal carotid artery just distal to the carotid  bifurcation. Severely diminished opacification of the right middle cerebral artery.  -CT Head 11/10 >Mild patchy areas of increased density in the right cerebellum,right hippocampus, and posterior limb internal capsule the right  MRI 11/10 >> areas of acute infarct in the R MCA territory, no hemorrhagic conversion, likely hypertensive chronic micro hemorrhage in the right cerebellum  -ECHO 11/11 >> ef 55%  CULTURES:  ANTIBIOTICS:  SIGNIFICANT EVENTS: 11/10  Admit, IR for  Cerebral angiogram s/p middle cerebral artery embolism. 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 Taken to IR for recanalization of right ICA.    LINES/TUBES: ET tube 11/10 >> Left Radial Aline 11/10 >>out R Femoral Sheath 11/10 >> 11/11  DISCUSSION: 82 year old male presents to ED 11/10 with acute onset left hemiplegia and asphasia.  CTA revealed occlusion of the right internal carotid artery and right MCA. Went to IR for revascularization. Returned to ICU on vent and sedated. Tolerates PS vent but decreased LOC a concern.  ASSESSMENT / PLAN:  PULMONARY A: Acute Respiratory Failure secondary to CVA  P:   - PRVC 8 cc/kg, reduce rate to 16. - Begin PS trials but no extubation given mental status. Weanable but don't know if he can protect airway - intermittent CXR. - Daily SBT, but no extubation given mental status. - Titrate O2 for sat of 88-92%.  CARDIOVASCULAR A:  H/O Afib, HTN HLD 11/13 echo ef 55% P:   - Keep Systolic 120-160 - Labetalol / hydralazine PRN for systolic > 120-160 - Hold home Pradaxa, Lisinopril, and Niacin  - Cardiac monitoring  - ICU hemodynamic monitoring   RENAL  Recent Labs Lab 10/25/16 0431 10/26/16 0500 10/27/16 0250  K 3.3* 3.3* 3.9     A:   Hypokalemia   P:   - Replace electrolytes as needed  - Strick I & O - Daily BMP  - NS to 50  ml/hr  GASTROINTESTINAL A:   R/O dysphagia  P:   - PPI - NPO  - Will need swallow eval if extubated  - OGT - Continue TF  HEMATOLOGIC A:   Thrombocytopenia - unknown baseline, on chronic anticoagulation  Anemia  P:  - Trend CBC - Hold home Pradaxa  - SCDs   INFECTIOUS A: No issues.  P:   - Trend WBC and fever curve   ENDOCRINE CBG (last 3)   Recent Labs  10/26/16 2355 10/27/16 0334 10/27/16 0825  GLUCAP 147* 168* 155*   A:   Hyperglycemia  P:   - SSI - Q4H Glucose checks   NEUROLOGIC A:   Right MCA/ACA s/p middle cerebral artery embolism  P:   - Per Neurology  - Repeat CT 11/12 noted - Frequent Neuro Exams  - PRN Fentanyl  - D/c propofol to  allow for neuro exam  - RASS goal: 0    FAMILY  - Updates: no family at beside 11/14 am, family meet for 11/14 for course of action.  - Inter-disciplinary family meet or Palliative Care meeting due by:  10/30/2016  -App CCT 32 min  Brett CanalesSteve Minor ACNP [facility] Pager [phone] till 3 pm If no answer page [phone] 10/27/2016, 9:04 AM  Attending Note:  82 year old male with PMH above presenting with a large ischemic stroke who is now intubated and his airway protection is questionable at best.  On exam, he is able to breath well on 5/5 but gag is weak and his airway protection is questionable at best.  I reviewed CXR myself, ETT ok.  Patient is responsive but very weak.  I spoke with wife and two sons extensively.  They are still deciding on trach/peg vs one way extubation.  They are currently discussing it and they are to come back to us later today with decision.  Anticipate will extubate in AM.  Active diureses today and will need decision on trach/peg vs one way extubation prior to extubation.  The patient is critically ill with multiple organ systems failure and requires high complexity decision making for assessment and support, frequent evaluation and titration of therapies, application of advanced  monitoring technologies and extensive interpretation of multiple databases.   Critical Care Time devoted to patient care services described in this note is  35  Minutes. This time reflects time of care of this signee Dr Koren BoundWesam Yacoub. This critical care time does not reflect procedure time, or teaching time or supervisory time of PA/NP/Med student/Med Resident etc but could involve care discussion time.  Alyson ReedyWesam G. Yacoub, M.D. [facility]. Pager: [phone]. After hours pager: [phone].

## 2016-10-27 NOTE — Progress Notes (Signed)
Occupational Therapy Treatment Patient Details Name: Sean [name] MRN: [id number] DOB: 10-Apr-1926 Today's Date: 10/27/2016    History of present illness Sean [name] is a 82 y.o. male admitted with He has expressive aphasia and Not moving left side with right-sided gaze deviation. MRI: Areas of acute infarction in the right MCA territory involving the insula, frontal operculum, anteromedial temporal lobe, and lateral temporal parietal junction s/p revascularization.   OT comments  This 82 yo male admitted with above making progress with following commands in [place]. He will continue to benefit from acute OT with follow up on CIR to increase independence and decrease burden of care.  Follow Up Recommendations  CIR;Supervision/Assistance - 24 hour    Equipment Recommendations  Other (comment) (TBD at next venue)       Precautions / Restrictions Precautions Precautions: Fall Precaution Comments: vent Restrictions Weight Bearing Restrictions: No       Mobility Bed Mobility Overal bed mobility: Needs Assistance;+2 for physical assistance;+ 2 for safety/equipment Bed Mobility: Supine to Sit;Sit to Supine     Supine to sit: Total assist;+2 for physical assistance;+2 for safety/equipment Sit to supine: Total assist;+2 for physical assistance;+2 for safety/equipment   General bed mobility comments: No assistance from pt.   Transfers                      Balance Overall balance assessment: Needs assistance Sitting-balance support: Feet supported;Single extremity supported Sitting balance-Leahy Scale: Zero Sitting balance - Comments: Demonstrates good righting reactions when falling to the right and anteriorly inconsistently using RUE; but no righting reactions when falling backwards. Focused on better alignment of cervical spine and scapula. At times pushes with RUE Postural control: Left lateral lean;Posterior lean                         ADL                                          General ADL Comments: total A (attempted hand over hand for pt to wash face with washcloth with RUE, but pt resisted)      Vision                 Additional Comments: Pt still not opening his eyes much and when he does they are only brief periods of time and only his left eye. When he does open his left eye it is deviated to the left          Cognition   Behavior During Therapy: Flat affect Overall Cognitive Status: Difficult to assess Area of Impairment: Following commands;Problem solving        Following Commands: Follows one step commands with increased time     Problem Solving: Slow processing;Decreased initiation;Requires verbal cues;Requires tactile cues General Comments: Follows 1 step commands when spoken in Mandarin from wife; able to give thumbs up, thumbs down, high five and wiggle toes on command with increased time.  Right gaze preference.     Extremity/Trunk Assessment   LUE more edematous today (arm propped up more and asked RN to get an order for right resting hand splint from [employer])                       Pertinent Vitals/ Pain       Pain Assessment: Faces Faces  Pain Scale: Hurts a little bit Pain Location: with noxious stimulation per 4 extremties Pain Descriptors / Indicators: Grimacing Pain Intervention(s): Monitored during session;Repositioned         Frequency  Min 3X/week        Progress Toward Goals  OT Goals(current goals can now be found in the care plan section)  Progress towards OT goals: Progressing toward goals     Plan Discharge plan remains appropriate    Co-evaluation    PT/OT/SLP Co-Evaluation/Treatment: Yes Reason for Co-Treatment: Complexity of the patient's impairments (multi-system involvement);For patient/therapist safety PT goals addressed during session: Mobility/safety with mobility;Strengthening/ROM;Balance OT goals addressed during session:  Strengthening/ROM      End of Session Equipment Utilized During Treatment:  (none)   Activity Tolerance Patient limited by lethargy   Patient Left in bed;with restraints reapplied (right mitt and wrist)   Nurse Communication  (need for RUE resting hand splint from [employer])        Time: 6578-46961312-1341 OT Time Calculation (min): 29 min  Charges: OT General Charges $OT Visit: 1 Procedure OT Treatments $Therapeutic Activity: 8-22 mins  Evette GeorgesLeonard, Shanece Cochrane Eva [phone] 10/27/2016, 3:01 PM

## 2016-10-27 NOTE — Progress Notes (Signed)
STROKE TEAM PROGRESS NOTE   SUBJECTIVE (INTERVAL HISTORY) Is more responsive with family translating the commands. Remains intubated. Keeps eyes closed.   OBJECTIVE Temp:  [97.4 F (36.3 C)-98.8 F (37.1 C)] 98.8 F (37.1 C) (11/14 1200) Pulse Rate:  [49-88] 58 (11/14 1400) Cardiac Rhythm: Atrial fibrillation (11/14 0730) Resp:  [10-20] 18 (11/14 1400) BP: (110-188)/(53-94) 122/60 (11/14 1400) SpO2:  [98 %-100 %] 100 % (11/14 1400) FiO2 (%):  [40 %] 40 % (11/14 1200) Weight:  [203 lb 11.3 oz (92.4 kg)] 203 lb 11.3 oz (92.4 kg) (11/14 0500)  CBC:  Recent Labs Lab 10/23/16 0345  10/24/16 0500  10/26/16 0500 10/27/16 0250  WBC 6.1  --  6.5  < > 6.4 6.2  NEUTROABS 3.6  --  4.7  --   --   --   HGB 13.4  < > 9.7*  < > 10.0* 10.0*  HCT 40.1  < > 29.7*  < > 31.5* 31.0*  MCV 87.0  --  86.3  < > 89.0 89.3  PLT 114*  --  109*  < > 108* 97*  < > = values in this interval not displayed.  Basic Metabolic Panel:   Recent Labs Lab 10/25/16 1756 10/26/16 0500 10/27/16 0250  NA  --  143 143  K  --  3.3* 3.9  CL  --  115* 116*  CO2  --  22 22  GLUCOSE  --  159* 180*  BUN  --  14 15  CREATININE  --  0.58* 0.62  CALCIUM  --  8.0* 8.0*  MG 2.0  --  2.0  PHOS 3.1  --  2.9    Lipid Panel:     Component Value Date/Time   CHOL 104 10/24/2016 0719   TRIG 111 10/24/2016 0719   HDL 29 (L) 10/24/2016 0719   CHOLHDL 3.6 10/24/2016 0719   VLDL 22 10/24/2016 0719   LDLCALC 53 10/24/2016 0719   HgbA1c:  Lab Results  Component Value Date   HGBA1C 6.2 (H) 10/24/2016   Urine Drug Screen:     Component Value Date/Time   LABOPIA NONE DETECTED 10/23/2016 0628   COCAINSCRNUR NONE DETECTED 10/23/2016 0628   LABBENZ NONE DETECTED 10/23/2016 0628   AMPHETMU NONE DETECTED 10/23/2016 0628   THCU NONE DETECTED 10/23/2016 0628   LABBARB NONE DETECTED 10/23/2016 0628      IMAGING I have personally reviewed the radiological images below and agree with the radiology  interpretations.  DG Chest Portable - 1 View 10/25/2016 1. Stable support apparatus. 2. Increased interstitial markings on the right versus the left, more focal in the medial right base. This could represent asymmetric edema. Recommend attention on follow-up.  Ct Angio Head and Neck W Or Wo Contrast 10/23/2016 1. Occlusion of the right internal carotid artery just distal to the carotid bifurcation  2. Severely diminished opacification of the right middle cerebral artery, in keeping with acute right MCA infarct and the reported clinical left-sided symptoms.  3. No hemodynamically significant stenosis of the left carotid or vertebral arteries.   Ct Cerebral Perfusion W Contrast 10/23/2016 Motion during the perfusion scan compromises the obtained data. The calculated mismatch volume of 193 mL is likely unreliable and is not expected to provide accurate assessment of the ischemic penumbra.   Ct Head Code Stroke W/o Cm 10/23/2016 1. No acute intracranial hemorrhage.  2. ASPECTS is 10.  No CT evidence of acute cortical infarct.   Cerebral Angiogram S/P Rt common carotid ,lt  common caroptid anr RT vert artery angiograms,follwed by complete revascularization of occluded RT ICA,RT MCA and RT ACA with x 4 passes with solitaire 4mm x 40 mm retrieval device ,x 1pass with the 064 ACE aspiration catheter and 7.2 mg of INTEGRELIN   with a TICI 2B reperfusion.  MRI Head Wo Contrast 10/23/2016 1. Areas of acute infarction in the right MCA territory involving the insula, frontal operculum, anteromedial temporal lobe, and lateral temporal parietal junction. No evidence for hemorrhagic conversion. 2. Sequelae of small chronic hemorrhagic infarcts in the right cerebellum and central distribution chronic microhemorrhage likely hypertensive in etiology. 3. Advanced chronic microvascular ischemic changes and parenchymal volume loss.  Ct Head head  10/25/2016 IMPRESSION: Progressive low-density has  developed in the right insular and frontal region and in the right temporal and parietal region consistent with completed infarction in those regions. Mild swelling but no hemorrhage or shift.   EEG 10/26/16 1.  diffuse cerebral dysfunction that is non-specific in etiology and can be seen with hypoxic/ischemic injury, toxic/metabolic encephalopathies, neurodegenerative disorders, or medication effect.   2.  Structural or other physiologic abnormality in the right hemisphere, such as in setting of a stroke in that region.   TTE 11/13 Left ventricle: The cavity size was normal. There was mild focal   basal hypertrophy of the septum. Systolic function was normal.   The estimated ejection fraction was in the range of 55% to 60%.   Wall motion was normal; there were no regional wall motion   abnormalities.'   PHYSICAL EXAM  Temp:  [97.4 F (36.3 C)-98.8 F (37.1 C)] 98.8 F (37.1 C) (11/14 1200) Pulse Rate:  [49-88] 58 (11/14 1400) Resp:  [10-20] 18 (11/14 1400) BP: (110-188)/(53-94) 122/60 (11/14 1400) SpO2:  [98 %-100 %] 100 % (11/14 1400) FiO2 (%):  [40 %] 40 % (11/14 1200) Weight:  [203 lb 11.3 oz (92.4 kg)] 203 lb 11.3 oz (92.4 kg) (11/14 0500)  General - Well nourished, well developed elderly asian male, intubated. Keeps eyes closed.   Ophthalmologic - Fundi not visualized due to noncooperation.  Cardiovascular - irregularly irregular heart rate and rhythm.  Neuro - intubated, eyes closed, follows some commands when asked by family. Pupil unequal sizes, reactive but sluggish. Moves right sided spontaneously. Withdraws left arm and left leg to pain. Sensation, coordination, and gait not tested.   ASSESSMENT/PLAN Mr. Josimar Corning is a 82 y.o. male with history of hypertension, hyperlipidemia, and atrial fibrillation on Pradaxa presenting with left hemiplegia and speech difficulties.Marland Kitchen He did not receive IV t-PA due to anticoagulation. Had mechanical thrombectomy.  Stroke:  Right  MCA infarct due to right ICA occlusion s/p mechanical thrombectomy, embolic secondary to atrial fibrillation noncompliance with Pradaxa.  Resultant  Intubated, left  Hemiparesis.   MRI - acute infarction in the right MCA territory   CTA H&N - acute right MCA infarct. Occlusion of the right ICA.  Repeat CT 10/25/2016 - evolving right MCA infarct, no hemorrhage, no midline shift  2D Echo - unremarkable  LDL - 53  HgbA1c - 6.2 - prediabetes  VTE prophylaxis - subcutaneous heparin Diet NPO time specified  Pradaxa (dabigatran) twice a day prior to admission, now on ASA. Hold off anticoagulation due to moderately large right MCA infarct.  Ongoing aggressive stroke risk factor management  Therapy recommendations: CIR  Disposition:  pending  Had family meeting today regarding GOC. They will decide whether to do terminal extubation and DNR or reintubation if needed. PCCM will attempt extubation once  family makes a decision as appropriate.   Right ICA occlusion  Likely due to A. fib noncompliance with Pradaxa  S/p mechanical thrombectomy  BP goal <180.    Chronic A. fib  On Pradaxa at home however missing doses one day PTA  Hold off Pradaxa due to moderately large right MCA infarct  Consider resume Pradaxa in one week if no procedure planned  Respiratory failure  Intubated.   CCM on board  Wean off ventilation as able.  Decreased urine output  Low urine output remains, will do I/o cath, if has large volume will place foley, currently has condom cath.   Normal kidney function. May consider renal u/s to look for obstructive process.   Vital signs stable  CXR no pulmonary edema  CCM on board  May consider further fluid bolus if needed  Hypertension  Stable   BP goal < 180 due to recanalization of right ICA to avoid hyperperfusion syndrome  Avoid hypotension  Hyperlipidemia  Home meds:  Niacin - not resumed.  LDL 53, goal <  70  Nutrition  NPO  Gastric tube 10/23/2016  Feeding supplement 45 cc / hr - started 10/24/2016  Other Stroke Risk Factors  Advanced age  Other Active Problems  Hypokalemia - replete as needed  S/p bilateral cataract surgery  Anemia - 9.7 / 29.7 -> 9.7 / 29.2  Thrombocytopenia - 109 -> 99  Hospital day # 4   Tasrif Ahmed, M.D. PGY-3  I have personally examined this patient, reviewed notes, independently viewed imaging studies, participated in medical decision making and plan of care.ROS completed by me personally and pertinent positives fully documented  I have made any additions or clarifications directly to the above note.  I had a long discussion with the patient's wife and son at the bedside with the patient's son serving as Cong[place]inese language interpreter. I discussed the prognosis, plan of care and answered questions. Family to make a decision about DO NOT RESUSCITATE versus trach and PEG in case patient feels extubation tomorrow. Family is in agreement and will let us know. This patient is critically ill and at significant risk of neurological worsening, death and care requires constant monitoring of vital signs, hemodynamics,respiratory and cardiac monitoring, extensive review of multiple databases, frequent neurological assessment, discussion with family, other specialists and medical decision making of high complexity.I have made any additions or clarifications directly to the above note.This critical care time does not reflect procedure time, or teaching time or supervisory time of PA/NP/Med Resident etc but could involve care discussion time.  I spent 40 minutes of neurocritical care time  in the care of  this patient.      Delia HeadyPramod Sethi, MD Medical Director [facility] Pager: [phone] 10/27/2016 4:21 PM

## 2016-10-27 NOTE — Progress Notes (Signed)
Physical Therapy Treatment Patient Details Name: Sean AbedKong Clavel MRN: [id number] DOB: 01-05-1926 Today's Date: 10/27/2016    History of Present Illness Sean [name] is a 82 y.o. male admitted with He has expressive aphasia and Not moving left side with right-sided gaze deviation. MRI: Areas of acute infarction in the right MCA territory involving the insula, frontal operculum, anteromedial temporal lobe, and lateral temporal parietal junction s/p revascularization.    PT Comments    Patient continues to be lethargic and only opens left eye once or twice for a few seconds inconsistently during session. Able to follow simple 1 step commands with multi modal cues and increased time when asked in Mandarin. Demonstrates good righting reactions with LOB to the right and anteriorly. Will continue to follow. Plan to have interpreter present for next session.   Follow Up Recommendations  CIR     Equipment Recommendations  Other (comment)    Recommendations for Other Services       Precautions / Restrictions Precautions Precautions: Fall Precaution Comments: vent, peg Restrictions Weight Bearing Restrictions: No    Mobility  Bed Mobility Overal bed mobility: Needs Assistance;+2 for physical assistance;+ 2 for safety/equipment Bed Mobility: Supine to Sit;Sit to Supine     Supine to sit: Total assist;+2 for physical assistance;+2 for safety/equipment Sit to supine: Total assist;+2 for physical assistance;+2 for safety/equipment   General bed mobility comments: No assistance from pt.   Transfers                    Ambulation/Gait                 Stairs            Wheelchair Mobility    Modified Rankin (Stroke Patients Only) Modified Rankin (Stroke Patients Only) Pre-Morbid Rankin Score: Moderately severe disability (unsure) Modified Rankin: Severe disability     Balance Overall balance assessment: Needs assistance Sitting-balance support: Feet  supported;Single extremity supported Sitting balance-Leahy Scale: Zero Sitting balance - Comments: Demonstrates good righting reactions when falling to the right and anteriorly inconsistently using RUE; but no righting reactions when falling backwards. Focused on better alignment of cervical spine and scapula. Postural control: Left lateral lean;Posterior lean                          Cognition Arousal/Alertness: Lethargic Behavior During Therapy: Flat affect Overall Cognitive Status: Difficult to assess Area of Impairment: Following commands;Problem solving       Following Commands: Follows one step commands with increased time     Problem Solving: Slow processing;Decreased initiation;Requires verbal cues;Requires tactile cues General Comments: Follows 1 step commands when spoken in Mandarin from wife; able to give thumbs up, thumbs down, high five and wiggle toes on command with increased time.  Right gaze preference.     Exercises      General Comments General comments (skin integrity, edema, etc.): VSS, wife present during session and assisted with mandarin      Pertinent Vitals/Pain Pain Assessment: Faces Faces Pain Scale: Hurts a little bit Pain Location: grimaces Pain Descriptors / Indicators: Grimacing Pain Intervention(s): Monitored during session;Repositioned    Home Living                      Prior Function            PT Goals (current goals can now be found in the care plan section) Progress towards PT goals: Progressing toward  goals    Frequency    Min 3X/week      PT Plan Frequency needs to be updated    Co-evaluation PT/OT/SLP Co-Evaluation/Treatment: Yes Reason for Co-Treatment: Complexity of the patient's impairments (multi-system involvement);For patient/therapist safety PT goals addressed during session: Mobility/safety with mobility;Strengthening/ROM;Balance       End of Session Equipment Utilized During Treatment:  Other (comment) Activity Tolerance: Patient limited by lethargy Patient left: in bed;with call bell/phone within reach;with bed alarm set;with restraints reapplied;with family/visitor present     Time: 1610-96041312-1341 PT Time Calculation (min) (ACUTE ONLY): 29 min  Charges:  $Therapeutic Activity: 8-22 mins                    G Codes:      Adrianna Dudas A Farron Watrous 10/27/2016, 2:12 PM Mylo RedShauna Timo Hartwig, PT, DPT [phone]

## 2016-10-27 NOTE — Care Management Note (Signed)
Case Management Note  Patient Details  Name: Sean [name] MRN: [id number] Date of Birth: 1926-09-12  Subjective/Objective: Pt admitted on 10/23/16 s/p Rt MCA infarct.  PTA, pt resided at home with family members.                     Action/Plan: Pt remains intubated currently; will follow for discharge planning as pt progresses.    Expected Discharge Date:                  Expected Discharge Plan:  IP Rehab Facility  In-House Referral:     Discharge planning Services  CM Consult  Post Acute Care Choice:    Choice offered to:     DME Arranged:    DME Agency:     HH Arranged:    HH Agency:     Status of Service:  In process, will continue to follow  If discussed at Long Length of Stay Meetings, dates discussed:    Additional Comments:  Glennon Macmerson, Roran Wegner M, RN 10/27/2016, 4:36 PM

## 2016-10-27 NOTE — Progress Notes (Signed)
Rehab admissions - I am following for potential acute inpatient rehab admission.  Patient currently on the vent.  Call me for questions.  #[phone]

## 2016-10-28 ENCOUNTER — Inpatient Hospital Stay (HOSPITAL_COMMUNITY): Payer: Medicare (Managed Care)

## 2016-10-28 LAB — BLOOD GAS, ARTERIAL
Acid-Base Excess: 2.2 mmol/L — ABNORMAL HIGH (ref 0.0–2.0)
BICARBONATE: 25.4 mmol/L (ref 20.0–28.0)
Drawn by: 41977
FIO2: 40
LHR: 16 {breaths}/min
O2 SAT: 95.2 %
PATIENT TEMPERATURE: 98.6
PCO2 ART: 33.7 mmHg (ref 32.0–48.0)
PEEP: 5 cmH2O
PH ART: 7.49 — AB (ref 7.350–7.450)
PO2 ART: 75.5 mmHg — AB (ref 83.0–108.0)
VT: 530 mL

## 2016-10-28 LAB — PROTIME-INR
INR: 1.13
PROTHROMBIN TIME: 14.5 s (ref 11.4–15.2)

## 2016-10-28 LAB — CBC
HEMATOCRIT: 33.3 % — AB (ref 39.0–52.0)
Hemoglobin: 10.9 g/dL — ABNORMAL LOW (ref 13.0–17.0)
MCH: 29 pg (ref 26.0–34.0)
MCHC: 32.7 g/dL (ref 30.0–36.0)
MCV: 88.6 fL (ref 78.0–100.0)
PLATELETS: 122 10*3/uL — AB (ref 150–400)
RBC: 3.76 MIL/uL — ABNORMAL LOW (ref 4.22–5.81)
RDW: 14.8 % (ref 11.5–15.5)
WBC: 9.3 10*3/uL (ref 4.0–10.5)

## 2016-10-28 LAB — GLUCOSE, CAPILLARY
Glucose-Capillary: 118 mg/dL — ABNORMAL HIGH (ref 65–99)
Glucose-Capillary: 148 mg/dL — ABNORMAL HIGH (ref 65–99)
Glucose-Capillary: 153 mg/dL — ABNORMAL HIGH (ref 65–99)
Glucose-Capillary: 158 mg/dL — ABNORMAL HIGH (ref 65–99)
Glucose-Capillary: 179 mg/dL — ABNORMAL HIGH (ref 65–99)
Glucose-Capillary: 192 mg/dL — ABNORMAL HIGH (ref 65–99)

## 2016-10-28 LAB — BASIC METABOLIC PANEL
Anion gap: 9 (ref 5–15)
BUN: 12 mg/dL (ref 6–20)
CHLORIDE: 104 mmol/L (ref 101–111)
CO2: 28 mmol/L (ref 22–32)
CREATININE: 0.6 mg/dL — AB (ref 0.61–1.24)
Calcium: 8.3 mg/dL — ABNORMAL LOW (ref 8.9–10.3)
GFR calc Af Amer: >60 mL/min (ref >60)
GFR calc non Af Amer: >60 mL/min (ref >60)
Glucose, Bld: 127 mg/dL — ABNORMAL HIGH (ref 65–99)
POTASSIUM: 3.5 mmol/L (ref 3.5–5.1)
SODIUM: 141 mmol/L (ref 135–145)

## 2016-10-28 LAB — MAGNESIUM: Magnesium: 1.9 mg/dL (ref 1.7–2.4)

## 2016-10-28 LAB — APTT: APTT: 33 s (ref 24–36)

## 2016-10-28 LAB — PHOSPHORUS: Phosphorus: 3.9 mg/dL (ref 2.5–4.6)

## 2016-10-28 MED ORDER — ETOMIDATE 2 MG/ML IV SOLN
40.0000 mg | Freq: Once | INTRAVENOUS | Status: AC
  Administered 2016-10-29: 20 mg via INTRAVENOUS

## 2016-10-28 MED ORDER — VECURONIUM BROMIDE 10 MG IV SOLR
10.0000 mg | Freq: Once | INTRAVENOUS | Status: AC
  Administered 2016-10-29: 10 mg via INTRAVENOUS

## 2016-10-28 MED ORDER — MIDAZOLAM HCL 2 MG/2ML IJ SOLN
4.0000 mg | Freq: Once | INTRAMUSCULAR | Status: AC
  Administered 2016-10-29: 4 mg via INTRAVENOUS
  Filled 2016-10-28: qty 4

## 2016-10-28 MED ORDER — PROPOFOL 500 MG/50ML IV EMUL: 5.0000 ug/kg/min | Freq: Once | INTRAVENOUS | Status: DC

## 2016-10-28 MED ORDER — FENTANYL CITRATE (PF) 100 MCG/2ML IJ SOLN
200.0000 ug | Freq: Once | INTRAMUSCULAR | Status: AC
  Administered 2016-10-29: 200 ug via INTRAVENOUS
  Filled 2016-10-28: qty 4

## 2016-10-28 MED ORDER — ACETAMINOPHEN 160 MG/5ML PO SOLN
650.0000 mg | Freq: Four times a day (QID) | ORAL | Status: DC | PRN
  Administered 2016-10-28 – 2016-11-01 (×5): 650 mg via ORAL
  Filled 2016-10-28 (×5): qty 20.3

## 2016-10-28 NOTE — Consult Note (Signed)
Reason for Consult:Placement of PEG Referring Physician: [name] is an 82 y.o. male.  HPI: Admitted with R MCA stroke with occluded RICA.  This was on 11/10.  Still ventilated and will get tracheostomy on tomorrow.and PEG by [place].  Dr. Grandville Silos will likely be doing the procedure tomorrow.  Past Medical History:  Diagnosis Date  . Atrial fibrillation ([place])   . Hyperlipidemia   . Hypertension     Past Surgical History:  Procedure Laterality Date  . IR GENERIC HISTORICAL  10/23/2016   IR ANGIO VERTEBRAL SEL SUBCLAVIAN INNOMINATE UNI R MOD SED 10/23/2016 Luanne Bras, MD [facility]  . IR GENERIC HISTORICAL  10/23/2016   IR PERCUTANEOUS ART THROMBECTOMY/INFUSION INTRACRANIAL INC DIAG ANGIO 10/23/2016 Luanne Bras, MD [facility]  . IR GENERIC HISTORICAL  10/23/2016   IR ANGIO INTRA EXTRACRAN SEL COM CAROTID INNOMINATE UNI L MOD SED 10/23/2016 Luanne Bras, MD [facility]  . RADIOLOGY WITH ANESTHESIA N/A 10/23/2016   Procedure: RADIOLOGY WITH ANESTHESIA;  Surgeon: Luanne Bras, MD;  Location: [facility];  Service: Radiology;  Laterality: N/A;    No family history on file.  Social History:  reports that he quit smoking about 15 years ago. He has never used smokeless tobacco. His alcohol and drug histories are not on file.  Allergies: No Known Allergies  Medications: I have reviewed the patient's current medications.  Results for orders placed or performed during the hospital encounter of 10/23/16 (from the past 48 hour(s))  Glucose, capillary     Status: Abnormal   Collection Time: 10/26/16  4:01 PM  Result Value Ref Range   Glucose-Capillary 145 (H) 65 - 99 mg/dL   Comment 1 Notify RN    Comment 2 Document in Chart   Glucose, capillary     Status: Abnormal   Collection Time: 10/26/16  8:53 PM  Result Value Ref Range   Glucose-Capillary 175 (H) 65 - 99 mg/dL  Glucose, capillary     Status: Abnormal   Collection Time: 10/26/16 11:55 PM  Result  Value Ref Range   Glucose-Capillary 147 (H) 65 - 99 mg/dL  CBC     Status: Abnormal   Collection Time: 10/27/16  2:50 AM  Result Value Ref Range   WBC 6.2 4.0 - 10.5 K/uL   RBC 3.47 (L) 4.22 - 5.81 MIL/uL   Hemoglobin 10.0 (L) 13.0 - 17.0 g/dL   HCT 31.0 (L) 39.0 - 52.0 %   MCV 89.3 78.0 - 100.0 fL   MCH 28.8 26.0 - 34.0 pg   MCHC 32.3 30.0 - 36.0 g/dL   RDW 15.5 11.5 - 15.5 %   Platelets 97 (L) 150 - 400 K/uL    Comment: REPEATED TO VERIFY CONSISTENT WITH PREVIOUS RESULT   Basic metabolic panel     Status: Abnormal   Collection Time: 10/27/16  2:50 AM  Result Value Ref Range   Sodium 143 135 - 145 mmol/L   Potassium 3.9 3.5 - 5.1 mmol/L   Chloride 116 (H) 101 - 111 mmol/L   CO2 22 22 - 32 mmol/L   Glucose, Bld 180 (H) 65 - 99 mg/dL   BUN 15 6 - 20 mg/dL   Creatinine, Ser 0.62 0.61 - 1.24 mg/dL   Calcium 8.0 (L) 8.9 - 10.3 mg/dL   GFR calc non Af Amer >60 >60 mL/min   GFR calc Af Amer >60 >60 mL/min    Comment: (NOTE) The eGFR has been calculated using the CKD EPI equation. This calculation  has not been validated in all clinical situations. eGFR's persistently <60 mL/min signify possible Chronic Kidney Disease.    Anion gap 5 5 - 15  Magnesium     Status: None   Collection Time: 10/27/16  2:50 AM  Result Value Ref Range   Magnesium 2.0 1.7 - 2.4 mg/dL  Phosphorus     Status: None   Collection Time: 10/27/16  2:50 AM  Result Value Ref Range   Phosphorus 2.9 2.5 - 4.6 mg/dL  Glucose, capillary     Status: Abnormal   Collection Time: 10/27/16  3:34 AM  Result Value Ref Range   Glucose-Capillary 168 (H) 65 - 99 mg/dL  Blood gas, arterial     Status: Abnormal   Collection Time: 10/27/16  4:01 AM  Result Value Ref Range   FIO2 40.00    Delivery systems VENTILATOR    Mode PRESSURE REGULATED VOLUME CONTROL    VT 530 mL   LHR 16 resp/min   Peep/cpap 5.0 cm H20   pH, Arterial 7.443 7.350 - 7.450   pCO2 arterial 32.0 32.0 - 48.0 mmHg   pO2, Arterial 111 (H) 83.0 -  108.0 mmHg   Bicarbonate 21.5 20.0 - 28.0 mmol/L   Acid-base deficit 2.0 0.0 - 2.0 mmol/L   O2 Saturation 98.0 %   Patient temperature 98.6    Collection site LEFT RADIAL    Drawn by [phone]    Sample type ARTERIAL DRAW    Allens test (pass/fail) PASS PASS  Glucose, capillary     Status: Abnormal   Collection Time: 10/27/16  8:25 AM  Result Value Ref Range   Glucose-Capillary 155 (H) 65 - 99 mg/dL   Comment 1 Notify RN    Comment 2 Document in Chart   Glucose, capillary     Status: Abnormal   Collection Time: 10/27/16 12:19 PM  Result Value Ref Range   Glucose-Capillary 156 (H) 65 - 99 mg/dL   Comment 1 Notify RN    Comment 2 Document in Chart   Glucose, capillary     Status: Abnormal   Collection Time: 10/27/16  3:39 PM  Result Value Ref Range   Glucose-Capillary 139 (H) 65 - 99 mg/dL   Comment 1 Notify RN    Comment 2 Document in Chart   Glucose, capillary     Status: Abnormal   Collection Time: 10/27/16  7:50 PM  Result Value Ref Range   Glucose-Capillary 171 (H) 65 - 99 mg/dL  Glucose, capillary     Status: Abnormal   Collection Time: 10/27/16 11:06 PM  Result Value Ref Range   Glucose-Capillary 151 (H) 65 - 99 mg/dL  Basic metabolic panel     Status: Abnormal   Collection Time: 10/28/16  3:26 AM  Result Value Ref Range   Sodium 141 135 - 145 mmol/L   Potassium 3.5 3.5 - 5.1 mmol/L   Chloride 104 101 - 111 mmol/L   CO2 28 22 - 32 mmol/L   Glucose, Bld 127 (H) 65 - 99 mg/dL   BUN 12 6 - 20 mg/dL   Creatinine, Ser 0.60 (L) 0.61 - 1.24 mg/dL   Calcium 8.3 (L) 8.9 - 10.3 mg/dL   GFR calc non Af Amer >60 >60 mL/min   GFR calc Af Amer >60 >60 mL/min    Comment: (NOTE) The eGFR has been calculated using the CKD EPI equation. This calculation has not been validated in all clinical situations. eGFR's persistently <60 mL/min signify possible Chronic Kidney Disease.  Anion gap 9 5 - 15  CBC     Status: Abnormal   Collection Time: 10/28/16  3:26 AM  Result Value Ref  Range   WBC 9.3 4.0 - 10.5 K/uL   RBC 3.76 (L) 4.22 - 5.81 MIL/uL   Hemoglobin 10.9 (L) 13.0 - 17.0 g/dL   HCT 33.3 (L) 39.0 - 52.0 %   MCV 88.6 78.0 - 100.0 fL   MCH 29.0 26.0 - 34.0 pg   MCHC 32.7 30.0 - 36.0 g/dL   RDW 14.8 11.5 - 15.5 %   Platelets 122 (L) 150 - 400 K/uL  Magnesium     Status: None   Collection Time: 10/28/16  3:26 AM  Result Value Ref Range   Magnesium 1.9 1.7 - 2.4 mg/dL  Phosphorus     Status: None   Collection Time: 10/28/16  3:26 AM  Result Value Ref Range   Phosphorus 3.9 2.5 - 4.6 mg/dL  Glucose, capillary     Status: Abnormal   Collection Time: 10/28/16  3:29 AM  Result Value Ref Range   Glucose-Capillary 118 (H) 65 - 99 mg/dL  Blood gas, arterial     Status: Abnormal   Collection Time: 10/28/16  4:25 AM  Result Value Ref Range   FIO2 40.00    Delivery systems VENTILATOR    Mode PRESSURE REGULATED VOLUME CONTROL    VT 530 mL   LHR 16 resp/min   Peep/cpap 5.0 cm H20   pH, Arterial 7.490 (H) 7.350 - 7.450   pCO2 arterial 33.7 32.0 - 48.0 mmHg   pO2, Arterial 75.5 (L) 83.0 - 108.0 mmHg   Bicarbonate 25.4 20.0 - 28.0 mmol/L   Acid-Base Excess 2.2 (H) 0.0 - 2.0 mmol/L   O2 Saturation 95.2 %   Patient temperature 98.6    Collection site LEFT RADIAL    Drawn by [phone]    Sample type ARTERIAL    Allens test (pass/fail) PASS PASS  Glucose, capillary     Status: Abnormal   Collection Time: 10/28/16  8:29 AM  Result Value Ref Range   Glucose-Capillary 192 (H) 65 - 99 mg/dL  Protime-INR     Status: None   Collection Time: 10/28/16 11:19 AM  Result Value Ref Range   Prothrombin Time 14.5 11.4 - 15.2 seconds   INR 1.13   APTT     Status: None   Collection Time: 10/28/16 11:19 AM  Result Value Ref Range   aPTT 33 24 - 36 seconds  Glucose, capillary     Status: Abnormal   Collection Time: 10/28/16 11:52 AM  Result Value Ref Range   Glucose-Capillary 179 (H) 65 - 99 mg/dL    Dg Chest Port 1 View  Result Date: 10/28/2016 CLINICAL DATA:  Acute  respiratory failure with hypoxia.  Ventilator. EXAM: PORTABLE CHEST 1 VIEW COMPARISON:  10/27/2016 and 10/25/2016 and 10/23/2016 and 01/29/2006 FINDINGS: Endotracheal tube and NG tube appear in good position. Minimal effusion at the right base, more apparent than on the prior study. Slight atelectasis at the left base, unchanged. Pulmonary vascularity is normal. Heart size is stable. IMPRESSION: Small residual right effusion. Persistent slight atelectasis at the left base. No significant Electronically Signed   By: Lorriane Shire M.D.   On: 10/28/2016 08:35   Dg Chest Port 1 View  Result Date: 10/27/2016 CLINICAL DATA:  82 year old male status post emergent right ICA occlusion and neuro-intervention for reperfusion. Initial encounter. EXAM: PORTABLE CHEST 1 VIEW COMPARISON:  Portable chest 10/25/2016 and  earlier. FINDINGS: Portable AP semi upright view at 0551 hours. Endotracheal tube tip in good position between the level the clavicles and carina. Enteric tube courses to the left upper quadrant, tip not included. Interval improved left lung volume and left lung base ventilation with improved visualization of the left hemidiaphragm. Stable cardiac size and mediastinal contours. Pulmonary vascular congestion without overt edema. No pneumothorax or pleural effusion identified. IMPRESSION: 1.  Stable lines and tubes. 2. Interval improved left lung base ventilation with mild residual atelectasis or consolidation. 3. Pulmonary vascular congestion without overt edema. Electronically Signed   By: Genevie Ann M.D.   On: 10/27/2016 07:34    ROS Blood pressure 132/67, pulse 68, temperature (!) 101.7 F (38.7 C), temperature source Rectal, resp. rate (!) 22, height 5' 7" (1.702 m), weight 89.5 kg (197 lb 5 oz), SpO2 100 %. Physical Exam  Constitutional: He appears well-developed and well-nourished. He appears lethargic.  Respiratory: Effort normal.  On ventilator  GI: Soft. Normal appearance and bowel sounds are  normal.  No prior surgical scars  Neurological: He appears lethargic. GCS eye subscore is 4. GCS verbal subscore is 1. GCS motor subscore is 5.  Skin: Skin is warm and dry.    Assessment/Plan: Right MCS stroke, need PEG for nutritional support and placement. Consent from the family.  Sean [name] 10/28/2016, 2:36 PM

## 2016-10-28 NOTE — Progress Notes (Signed)
PULMONARY / CRITICAL CARE MEDICINE   Name: Sean [name] MRN: [id number] DOB: 20-Mar-1926    ADMISSION DATE:  10/23/2016 CONSULTATION DATE:  10/23/16  REFERRING MD: Dr. Ranae PalmsYelverton   CHIEF COMPLAINT: Acute onset of left hemiplegia and aphasia  BRIEF SUMMARY: 82 y/o M admitted 11/10 with c/o AMS, no movement on left side, unable to speak.  Found to have R ICA occlusion / MCA CVA.  To Neuro IR, returned to ICU on vent.    SUBJECTIVE: Does not speak english  no events overnight.  VITAL SIGNS: BP 133/61   Pulse 73   Temp 98.8 F (37.1 C) (Axillary)   Resp (!) 21   Ht 5\' 7"  (1.702 m)   Wt 197 lb 5 oz (89.5 kg)   SpO2 99%   BMI 30.90 kg/m   HEMODYNAMICS:    VENTILATOR SETTINGS: Vent Mode: PSV;CPAP FiO2 (%):  [40 %] 40 % Set Rate:  [16 bmp] 16 bmp Vt Set:  [530 mL] 530 mL PEEP:  [5 cmH20] 5 cmH20 Pressure Support:  [5 cmH20] 5 cmH20 Plateau Pressure:  [11 cmH20-17 cmH20] 17 cmH20  INTAKE / OUTPUT: I/O last 3 completed shifts: In: 4585 [I.V.:1750; NG/GT:2275; IV Piggyback:560] Out: 6025 [Urine:6025]  PHYSICAL EXAMINATION: General: Elderly male in bed, does not open eyes to voice but does withdraw right hand to pain.Does not speak english Neuro: does not open eyes to command but does to pain and does withdraw right hand to pain.  Gag intact. decreased movement left arm HEENT: ETT, mm pink/moist Cardiovascular: s1/s2 regular, brady Lungs: Vent assisted breaths, coarse bilaterally   Abdomen: Non tender, non distended, active bowel sounds  Musculoskeletal: no acute deformities.  Skin: Warm, dry, intact  LABS:  BMET  Recent Labs Lab 10/26/16 0500 10/27/16 0250 10/28/16 0326  NA 143 143 141  K 3.3* 3.9 3.5  CL 115* 116* 104  CO2 22 22 28   BUN 14 15 12   CREATININE 0.58* 0.62 0.60*  GLUCOSE 159* 180* 127*    Electrolytes  Recent Labs Lab 10/25/16 1756 10/26/16 0500 10/27/16 0250 10/28/16 0326  CALCIUM  --  8.0* 8.0* 8.3*  MG 2.0  --  2.0 1.9  PHOS 3.1  --   2.9 3.9    CBC  Recent Labs Lab 10/26/16 0500 10/27/16 0250 10/28/16 0326  WBC 6.4 6.2 9.3  HGB 10.0* 10.0* 10.9*  HCT 31.5* 31.0* 33.3*  PLT 108* 97* 122*    Coag's  Recent Labs Lab 10/23/16 0345  APTT 36  INR 1.14    Sepsis Markers No results for input(s): LATICACIDVEN, PROCALCITON, O2SATVEN in the last 168 hours.  ABG  Recent Labs Lab 10/23/16 1533 10/27/16 0401 10/28/16 0425  PHART 7.493* 7.443 7.490*  PCO2ART 28.2* 32.0 33.7  PO2ART 174* 111* 75.5*   Liver Enzymes  Recent Labs Lab 10/23/16 0345  AST 23  ALT 13*  ALKPHOS 49  BILITOT 0.5  ALBUMIN 3.6   Cardiac Enzymes No results for input(s): TROPONINI, PROBNP in the last 168 hours.  Glucose  Recent Labs Lab 10/27/16 1219 10/27/16 1539 10/27/16 1950 10/27/16 2306 10/28/16 0329 10/28/16 0829  GLUCAP 156* 139* 171* 151* 118* 192*   Imaging Dg Chest Port 1 View  Result Date: 10/28/2016 CLINICAL DATA:  Acute respiratory failure with hypoxia.  Ventilator. EXAM: PORTABLE CHEST 1 VIEW COMPARISON:  10/27/2016 and 10/25/2016 and 10/23/2016 and 01/29/2006 FINDINGS: Endotracheal tube and NG tube appear in good position. Minimal effusion at the right base, more apparent than on the prior  study. Slight atelectasis at the left base, unchanged. Pulmonary vascularity is normal. Heart size is stable. IMPRESSION: Small residual right effusion. Persistent slight atelectasis at the left base. No significant Electronically Signed   By: Francene BoyersJames  Maxwell M.D.   On: 10/28/2016 08:35   STUDIES:  -CTA Head and Neck 11/10 > Occlusion of the right internal carotid artery just distal to the carotid bifurcation. Severely diminished opacification of the right middle cerebral artery.  -CT Head 11/10 >Mild patchy areas of increased density in the right cerebellum,right hippocampus, and posterior limb internal capsule the right  MRI 11/10 >> areas of acute infarct in the R MCA territory, no hemorrhagic conversion, likely  hypertensive chronic micro hemorrhage in the right cerebellum  -ECHO 11/11 >> ef 55%  CULTURES:  ANTIBIOTICS:  SIGNIFICANT EVENTS: 11/10  Admit, IR for  Cerebral angiogram s/p middle cerebral artery embolism. 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 Taken to IR for recanalization of right ICA.    LINES/TUBES: ET tube 11/10 >> Left Radial Aline 11/10 >>out R Femoral Sheath 11/10 >> 11/11  DISCUSSION: 82 year old male presents to ED 11/10 with acute onset left hemiplegia and asphasia.  CTA revealed occlusion of the right internal carotid artery and right MCA. Went to IR for revascularization. Returned to ICU on vent and sedated. Tolerates PS vent but decreased LOC a concern. Plan is for trial extubation.  ASSESSMENT / PLAN:  PULMONARY A: Acute Respiratory Failure secondary to CVA  P:   - PRVC 8 cc/kg, reduce rate to 16. - Begin PS trials but no extubation given mental status. Weanable but don't know if he can protect airway. Trial extubation and if fails then will need trach. - intermittent CXR. - Daily SBT, trial extubation - Titrate O2 for sat of 88-92%.  CARDIOVASCULAR A:  H/O Afib, HTN HLD 11/13 echo ef 55% P:   - Keep Systolic 120-160 - Labetalol / hydralazine PRN for systolic > 120-160 - Hold home Pradaxa, Lisinopril, and Niacin  - Cardiac monitoring  - ICU hemodynamic monitoring   RENAL  Recent Labs Lab 10/26/16 0500 10/27/16 0250 10/28/16 0326  K 3.3* 3.9 3.5     A:   Hypokalemia   P:   - Replace electrolytes as needed  - Strick I & O - Daily BMP  - NS to 50 ml/hr  GASTROINTESTINAL A:   R/O dysphagia  P:   - PPI - NPO  - Will need swallow eval if extubated  - OGT - Continue TF  HEMATOLOGIC A:   Thrombocytopenia - unknown baseline, on chronic anticoagulation  Anemia. Improved. P:  - Trend CBC - Hold home Pradaxa  - SCDs    INFECTIOUS A: No issues.  P:   - Trend WBC and fever curve   ENDOCRINE CBG (last 3)   Recent Labs  10/27/16 2306 10/28/16 0329 10/28/16 0829  GLUCAP 151* 118* 192*   A:   Hyperglycemia  P:   - SSI - Q4H Glucose checks   NEUROLOGIC A:   Right MCA/ACA s/p middle cerebral artery embolism  P:   - Per Neurology  - Repeat CT 11/12 noted - Frequent Neuro Exams  - PRN Fentanyl  - D/c propofol to allow for neuro exam  - RASS goal: 0    FAMILY  - Updates: no family at beside 11/15 am.  Plan on trial extubation when family arrives. If  he fails then re intubate and proceed with trach in near future.  - Inter-disciplinary family meet or Palliative Care meeting due by:  10/30/2016  -App CCT 32 min  [name] Minor ACNP [facility] P[facility] Pager [phone] till 3 pm If no answer page [phone] 10/28/2016, 8:38 AM  Attending Note:  82 year old male with vascular history and a-fib presenting with ICA/MCA ischemic stroke s/p revascularization who presents to NICU intubated.  On exam, patient is not following command with a weak gag, not extubateable given mental status.  I reviewed CXR myself, ETT in good position.  Spoke with Dr. Pearlean Brownie and family, ready for trach/peg.  After discussion, will proceed with tracheostomy at 10 AM on 11/16 and trauma surgeons to place PEG.  Will continue weaning for now.  The patient is critically ill with multiple organ systems failure and requires high complexity decision making for assessment and support, frequent evaluation and titration of therapies, application of advanced monitoring technologies and extensive interpretation of multiple databases.   Critical Care Time devoted to patient care services described in this note is  35  Minutes. This time reflects time of care of this signee Dr Koren Bound. This critical care time does not reflect procedure time, or teaching time or supervisory time of PA/NP/Med student/Med Resident etc but could involve care  discussion time.  Alyson Reedy, M.D. [facility] Pulmonary/Critical Care Medicine. Pager: [phone]. After hours pager: [phone].

## 2016-10-28 NOTE — Progress Notes (Signed)
STROKE TEAM PROGRESS NOTE   SUBJECTIVE (INTERVAL HISTORY) Patient is responsive to family. Denies any complaints. Remains intubated. Awakes to answer question to family but keeps eyes closed.BP adequately controlled. As per CCM patient will be unable to protect his airway and would require tracheostomy without trial of extubation.   OBJECTIVE Temp:  [97.5 F (36.4 C)-101.7 F (38.7 C)] 101.7 F (38.7 C) (11/15 1157) Pulse Rate:  [52-88] 64 (11/15 1100) Cardiac Rhythm: Atrial fibrillation (11/15 0800) Resp:  [15-23] 23 (11/15 1100) BP: (111-177)/(57-144) 128/68 (11/15 1100) SpO2:  [97 %-100 %] 100 % (11/15 1100) FiO2 (%):  [40 %] 40 % (11/15 1100) Weight:  [197 lb 5 oz (89.5 kg)] 197 lb 5 oz (89.5 kg) (11/15 0356)  CBC:  Recent Labs Lab 10/23/16 0345  10/24/16 0500  10/27/16 0250 10/28/16 0326  WBC 6.1  --  6.5  < > 6.2 9.3  NEUTROABS 3.6  --  4.7  --   --   --   HGB 13.4  < > 9.7*  < > 10.0* 10.9*  HCT 40.1  < > 29.7*  < > 31.0* 33.3*  MCV 87.0  --  86.3  < > 89.3 88.6  PLT 114*  --  109*  < > 97* 122*  < > = values in this interval not displayed.  Basic Metabolic Panel:   Recent Labs Lab 10/27/16 0250 10/28/16 0326  NA 143 141  K 3.9 3.5  CL 116* 104  CO2 22 28  GLUCOSE 180* 127*  BUN 15 12  CREATININE 0.62 0.60*  CALCIUM 8.0* 8.3*  MG 2.0 1.9  PHOS 2.9 3.9    Lipid Panel:     Component Value Date/Time   CHOL 104 10/24/2016 0719   TRIG 111 10/24/2016 0719   HDL 29 (L) 10/24/2016 0719   CHOLHDL 3.6 10/24/2016 0719   VLDL 22 10/24/2016 0719   LDLCALC 53 10/24/2016 0719   HgbA1c:  Lab Results  Component Value Date   HGBA1C 6.2 (H) 10/24/2016   Urine Drug Screen:     Component Value Date/Time   LABOPIA NONE DETECTED 10/23/2016 0628   COCAINSCRNUR NONE DETECTED 10/23/2016 0628   LABBENZ NONE DETECTED 10/23/2016 0628   AMPHETMU NONE DETECTED 10/23/2016 0628   THCU NONE DETECTED 10/23/2016 0628   LABBARB NONE DETECTED 10/23/2016 0628       IMAGING I have personally reviewed the radiological images below and agree with the radiology interpretations.  DG Chest Portable - 1 View 10/25/2016 1. Stable support apparatus. 2. Increased interstitial markings on the right versus the left, more focal in the medial right base. This could represent asymmetric edema. Recommend attention on follow-up.  Ct Angio Head and Neck W Or Wo Contrast 10/23/2016 1. Occlusion of the right internal carotid artery just distal to the carotid bifurcation  2. Severely diminished opacification of the right middle cerebral artery, in keeping with acute right MCA infarct and the reported clinical left-sided symptoms.  3. No hemodynamically significant stenosis of the left carotid or vertebral arteries.   Ct Cerebral Perfusion W Contrast 10/23/2016 Motion during the perfusion scan compromises the obtained data. The calculated mismatch volume of 193 mL is likely unreliable and is not expected to provide accurate assessment of the ischemic penumbra.   Ct Head Code Stroke W/o Cm 10/23/2016 1. No acute intracranial hemorrhage.  2. ASPECTS is 10.  No CT evidence of acute cortical infarct.   Cerebral Angiogram S/P Rt common carotid ,lt common caroptid anr RT  vert artery angiograms,follwed by complete revascularization of occluded RT ICA,RT MCA and RT ACA with x 4 passes with solitaire 4mm x 40 mm retrieval device ,x 1pass with the 064 ACE aspiration catheter and 7.2 mg of INTEGRELIN   with a TICI 2B reperfusion.  MRI Head Wo Contrast 10/23/2016 1. Areas of acute infarction in the right MCA territory involving the insula, frontal operculum, anteromedial temporal lobe, and lateral temporal parietal junction. No evidence for hemorrhagic conversion. 2. Sequelae of small chronic hemorrhagic infarcts in the right cerebellum and central distribution chronic microhemorrhage likely hypertensive in etiology. 3. Advanced chronic microvascular ischemic changes and  parenchymal volume loss.  Ct Head head  10/25/2016 IMPRESSION: Progressive low-density has developed in the right insular and frontal region and in the right temporal and parietal region consistent with completed infarction in those regions. Mild swelling but no hemorrhage or shift.   EEG 10/26/16 1.  diffuse cerebral dysfunction that is non-specific in etiology and can be seen with hypoxic/ischemic injury, toxic/metabolic encephalopathies, neurodegenerative disorders, or medication effect.   2.  Structural or other physiologic abnormality in the right hemisphere, such as in setting of a stroke in that region.   TTE 11/13 Left ventricle: The cavity size was normal. There was mild focal   basal hypertrophy of the septum. Systolic function was normal.   The estimated ejection fraction was in the range of 55% to 60%.   Wall motion was normal; there were no regional wall motion   abnormalities.'   PHYSICAL EXAM  Temp:  [97.5 F (36.4 C)-101.7 F (38.7 C)] 101.7 F (38.7 C) (11/15 1157) Pulse Rate:  [52-88] 64 (11/15 1100) Resp:  [15-23] 23 (11/15 1100) BP: (111-177)/(57-144) 128/68 (11/15 1100) SpO2:  [97 %-100 %] 100 % (11/15 1100) FiO2 (%):  [40 %] 40 % (11/15 1100) Weight:  [197 lb 5 oz (89.5 kg)] 197 lb 5 oz (89.5 kg) (11/15 0356)  General - Well nourished, well developed elderly asian male, intubated. Keeps eyes closed.   Ophthalmologic - Fundi not visualized due to noncooperation.  Cardiovascular - irregularly irregular heart rate and rhythm.  Neuro - intubated, eyes closed, follows some commands when asked by family. Pupil unequal sizes, reactive but sluggish. Moves right side spontaneously. Withdraws left arm and left leg to pain. Sensation, coordination, and gait not tested.   ASSESSMENT/PLAN [name] is a 82 y.o. male with history of hypertension, hyperlipidemia, and atrial fibrillation on Pradaxa presenting with left hemiplegia and speech difficulties.Marland Kitchen. He did  not receive IV t-PA due to anticoagulation. Had mechanical thrombectomy.  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  Resultant  Intubated, left  Hemiparesis.   MRI - acute infarction in the right MCA territory   CTA H&N - acute right MCA infarct. Occlusion of the right ICA.  Repeat CT 10/25/2016 - evolving right MCA infarct, no hemorrhage, no midline shift  2D Echo - unremarkable  LDL - 53  HgbA1c - 6.2 - prediabetes  VTE prophylaxis - subcutaneous heparin Diet NPO time specified Diet NPO time specified  Pradaxa (dabigatran) twice a day prior to admission, now on ASA. Hold off anticoagulation due to moderately large right MCA infarct.  Ongoing aggressive stroke risk factor management  Therapy recommendations: CIR  Disposition:  pending  Had another family discussion today. Family wants to be aggressive. He will not be successfully extubated per PCCM so we will proceed directly to trach  tomorrow. PCCM's assistance is highly appreciated. Also consulted trauma team for PEG tube placement.  Continue to work with PT/OT and SLP.   Right ICA occlusion  Likely due to A. fib noncompliance with Pradaxa  S/p mechanical thrombectomy  BP goal <180.    Chronic A. fib  On Pradaxa at home however missing doses one day PTA  Hold off Pradaxa due to moderately large right MCA infarct  Consider resume Pradaxa in one week if no procedure planned  Respiratory failure 2/2 to stroke  Intubated.   CCM on board  Will do trach tomorrow as patient's current mental status will make him unsuccessful for extubation.   Hypertension  Stable   BP goal < 180 due to recanalization of right ICA to avoid hyperperfusion syndrome  Avoid hypotension  Hyperlipidemia  Home meds:  Niacin - not resumed.  LDL 53, goal < 70  Nutrition  NPO  Gastric tube 10/23/2016  Feeding  supplement 45 cc / hr - started 10/24/2016  Will get PEG tube placed by trauma. Appreciate their assistance.   Other Stroke Risk Factors  Advanced age  Other Active Problems  Hypokalemia - replete as needed  S/p bilateral cataract surgery  Anemia - stable around 10s.  Thrombocytopenia -stable.  Hospital day # 5   Hyacinth Meekerasrif Ahmed, M.D. PGY-3   I have personally examined this patient, reviewed notes, independently viewed imaging studies, participated in medical decision making and plan of care.ROS completed by me personally and pertinent positives fully documented  I have made any additions or clarifications directly to the above note. Agree with note above. I had a long discussion with the patient's wife and son regarding his neurological condition, prognosis, plan of care and answered questions. Patient will clearly need more prolonged ventilatory support and nutritional needs and will benefit with tracheostomy and PEG tube placement. Patient's wife and son are in agreement with the plan. We will consult critical care medicine for tracheostomy and trauma team for PEG tube. This patient is critically ill and at significant risk of neurological worsening, death and care requires constant monitoring of vital signs, hemodynamics,respiratory and cardiac monitoring, extensive review of multiple databases, frequent neurological assessment, discussion with family, other specialists and medical decision making of high complexity.I have made any additions or clarifications directly to the above note.This critical care time does not reflect procedure time, or teaching time or supervisory time of PA/NP/Med Resident etc but could involve care discussion time.  I spent 35 minutes of neurocritical care time  in the care of  this patient.      Delia HeadyPramod Ediel Unangst, MD Medical Director [facility] Pager: [phone] 10/28/2016 12:15 PM

## 2016-10-29 ENCOUNTER — Inpatient Hospital Stay (HOSPITAL_COMMUNITY): Payer: Medicare (Managed Care)

## 2016-10-29 ENCOUNTER — Encounter (HOSPITAL_COMMUNITY): Payer: Medicare (Managed Care)

## 2016-10-29 ENCOUNTER — Encounter (HOSPITAL_COMMUNITY)
Admission: EM | Disposition: A | Discharge status: Skilled Nursing Facility | Payer: Self-pay | Source: Home / Self Care | Attending: Neurology

## 2016-10-29 DIAGNOSIS — I6300 Cerebral infarction due to thrombosis of unspecified precerebral artery: Secondary | ICD-10-CM

## 2016-10-29 HISTORY — PX: PEG PLACEMENT: SHX5437

## 2016-10-29 HISTORY — PX: ESOPHAGOGASTRODUODENOSCOPY: SHX5428

## 2016-10-29 LAB — CBC
HCT: 28.2 % — ABNORMAL LOW (ref 39.0–52.0)
Hemoglobin: 9.3 g/dL — ABNORMAL LOW (ref 13.0–17.0)
MCH: 29.2 pg (ref 26.0–34.0)
MCHC: 33 g/dL (ref 30.0–36.0)
MCV: 88.4 fL (ref 78.0–100.0)
PLATELETS: 120 10*3/uL — AB (ref 150–400)
RBC: 3.19 MIL/uL — AB (ref 4.22–5.81)
RDW: 15.2 % (ref 11.5–15.5)
WBC: 9.1 10*3/uL (ref 4.0–10.5)

## 2016-10-29 LAB — GLUCOSE, CAPILLARY
GLUCOSE-CAPILLARY: 116 mg/dL — AB (ref 65–99)
GLUCOSE-CAPILLARY: 134 mg/dL — AB (ref 65–99)
GLUCOSE-CAPILLARY: 110 mg/dL — AB (ref 65–99)
Glucose-Capillary: 117 mg/dL — ABNORMAL HIGH (ref 65–99)
Glucose-Capillary: 160 mg/dL — ABNORMAL HIGH (ref 65–99)
Glucose-Capillary: 92 mg/dL (ref 65–99)

## 2016-10-29 LAB — URINALYSIS, ROUTINE W REFLEX MICROSCOPIC
Glucose, UA: NEGATIVE mg/dL
Hgb urine dipstick: NEGATIVE
Ketones, ur: NEGATIVE mg/dL
Nitrite: POSITIVE — AB
PROTEIN: 100 mg/dL — AB
Specific Gravity, Urine: 1.024 (ref 1.005–1.030)
pH: 8.5 — ABNORMAL HIGH (ref 5.0–8.0)

## 2016-10-29 LAB — URINE MICROSCOPIC-ADD ON: WBC UA: NONE SEEN WBC/hpf (ref 0–5)

## 2016-10-29 SURGERY — EGD (ESOPHAGOGASTRODUODENOSCOPY)
Anesthesia: Moderate Sedation

## 2016-10-29 MED ORDER — ROCURONIUM BROMIDE 50 MG/5ML IV SOLN
20.0000 mg | Freq: Once | INTRAVENOUS | Status: DC
  Filled 2016-10-29: qty 2

## 2016-10-29 MED ORDER — MIDAZOLAM HCL 2 MG/2ML IJ SOLN
INTRAMUSCULAR | Status: AC
  Administered 2016-10-29: 2 mg
  Filled 2016-10-29: qty 2

## 2016-10-29 MED ORDER — VECURONIUM BROMIDE 10 MG IV SOLR
INTRAVENOUS | Status: AC
  Administered 2016-10-29: 10 mg
  Filled 2016-10-29: qty 10

## 2016-10-29 NOTE — Progress Notes (Signed)
eLink Physician-Brief Progress Note Patient Name: Sean AbedKong [name] DOB: [birth year] MRN: [id number]   Date of Service  10/29/2016  HPI/Events of Note  UA with few bacteria, WBC = 0 (?), Nitrite - positive and leuk esterase - small.  eICU Interventions  Will order urine culture.      Intervention Category Intermediate Interventions: Infection - evaluation and management  Sean [name] 10/29/2016, 4:22 PM

## 2016-10-29 NOTE — Progress Notes (Signed)
PULMONARY / CRITICAL CARE MEDICINE   Name: Sean [name] MRN: [id number] DOB: 1926-05-14    ADMISSION DATE:  10/23/2016 CONSULTATION DATE:  10/23/16  REFERRING MD: Dr. Ranae PalmsYelverton   CHIEF COMPLAINT: Acute onset of left hemiplegia and aphasia  BRIEF SUMMARY: 82 y/o M admitted 11/10 with c/o AMS, no movement on left side, unable to speak.  Found to have R ICA occlusion / MCA CVA.  To Neuro IR for thrombectomy, returned to ICU on vent.    SUBJECTIVE:  No acute change overnight.  Plan for trach/PEG this am.   VITAL SIGNS: BP 138/77   Pulse 75   Temp 98.3 F (36.8 C) (Axillary)   Resp 16   Ht 5\' 7"  (1.702 m)   Wt 89.5 kg (197 lb 5 oz)   SpO2 99%   BMI 30.90 kg/m   HEMODYNAMICS:    VENTILATOR SETTINGS: Vent Mode: PSV;CPAP FiO2 (%):  [40 %] 40 % Set Rate:  [16 bmp] 16 bmp Vt Set:  [530 mL] 530 mL PEEP:  [5 cmH20] 5 cmH20 Pressure Support:  [5 cmH20] 5 cmH20 Plateau Pressure:  [10 cmH20-15 cmH20] 15 cmH20  INTAKE / OUTPUT: I/O last 3 completed shifts: In: 3735 [I.V.:1800; NG/GT:1885; IV Piggyback:50] Out: 4350 [Urine:3850; Emesis/NG output:500]  PHYSICAL EXAMINATION: General: Elderly male in bed, does not open eyes to voice but does withdraw right hand to pain.Does not speak english Neuro: does not open eyes to command but does to pain and does withdraw right hand to pain.  Gag intact. decreased movement left arm HEENT: ETT, mm pink/moist Cardiovascular: s1/s2 regular, brady Lungs: Vent assisted breaths, coarse bilaterally   Abdomen: Non tender, non distended, active bowel sounds  Musculoskeletal: no acute deformities.  Skin: Warm, dry, intact  LABS:  BMET  Recent Labs Lab 10/26/16 0500 10/27/16 0250 10/28/16 0326  NA 143 143 141  K 3.3* 3.9 3.5  CL 115* 116* 104  CO2 22 22 28   BUN 14 15 12   CREATININE 0.58* 0.62 0.60*  GLUCOSE 159* 180* 127*    Electrolytes  Recent Labs Lab 10/25/16 1756 10/26/16 0500 10/27/16 0250 10/28/16 0326  CALCIUM  --  8.0*  8.0* 8.3*  MG 2.0  --  2.0 1.9  PHOS 3.1  --  2.9 3.9    CBC  Recent Labs Lab 10/26/16 0500 10/27/16 0250 10/28/16 0326  WBC 6.4 6.2 9.3  HGB 10.0* 10.0* 10.9*  HCT 31.5* 31.0* 33.3*  PLT 108* 97* 122*    Coag's  Recent Labs Lab 10/23/16 0345 10/28/16 1119  APTT 36 33  INR 1.14 1.13    Sepsis Markers No results for input(s): LATICACIDVEN, PROCALCITON, O2SATVEN in the last 168 hours.  ABG  Recent Labs Lab 10/23/16 1533 10/27/16 0401 10/28/16 0425  PHART 7.493* 7.443 7.490*  PCO2ART 28.2* 32.0 33.7  PO2ART 174* 111* 75.5*   Liver Enzymes  Recent Labs Lab 10/23/16 0345  AST 23  ALT 13*  ALKPHOS 49  BILITOT 0.5  ALBUMIN 3.6   Cardiac Enzymes No results for input(s): TROPONINI, PROBNP in the last 168 hours.  Glucose  Recent Labs Lab 10/28/16 1152 10/28/16 1545 10/28/16 1948 10/28/16 2336 10/29/16 0331 10/29/16 0821  GLUCAP 179* 148* 158* 153* 92 116*   Imaging No results found. STUDIES:  -CTA Head and Neck 11/10 > Occlusion of the right internal carotid artery just distal to the carotid bifurcation. Severely diminished opacification of the right middle cerebral artery.  -CT Head 11/10 >Mild patchy areas of increased density in  the right cerebellum,right hippocampus, and posterior limb internal capsule the right  MRI 11/10 >> areas of acute infarct in the R MCA territory, no hemorrhagic conversion, likely hypertensive chronic micro hemorrhage in the right cerebellum  -ECHO 11/11 >> ef 55%  CULTURES:  ANTIBIOTICS:  SIGNIFICANT EVENTS: 11/10  Admit, IR for  Cerebral angiogram s/p middle cerebral artery embolism. 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 Taken to IR for recanalization of right ICA.    LINES/TUBES: ET tube 11/10 >> Left Radial Aline 11/10 >>out R Femoral Sheath 11/10 >> 11/11  DISCUSSION: 82 year old male admitted  11/10 with R ICA stroke s/p mechanical thrombectomy with evolving large R MCA infarct and acute respiratory failure.  Mental status has not allowed extubation.  For trach 11/16.    ASSESSMENT / PLAN:  PULMONARY A: Acute Respiratory Failure secondary to CVA  P:   PS wean as tol - mental status does not allow for extubation, doubt he can protect airway For trach 11/16  Int CXR  Move quickly to ATC post trach  Mobilize/PT/OT    CARDIOVASCULAR A:  H/O Afib, HTN HLD 11/13 echo ef 55% P:  - Keep Systolic 120-160 - Labetalol / hydralazine PRN for systolic > 120-160 - Hold home Pradaxa r/t large R MCA infarct  - hold home Lisinopril, and Niacin  - Cardiac monitoring  - ICU hemodynamic monitoring   RENAL A:   Hypokalemia  P:   - Replace electrolytes as needed  - Strick I & O - Daily BMP  - NS to 50 ml/hr  GASTROINTESTINAL A:   dysphagia  P:   - PPI - PEG 11/16  - resume TF via PEG once ok with trauma   HEMATOLOGIC A:   Thrombocytopenia - unknown baseline, on chronic anticoagulation  Anemia. Improved. P:  - Trend CBC - Hold home Pradaxa  - SCDs   INFECTIOUS A: Fever 11/16 P:   - check CXR, WBC 11/16 - hold off on abx for now   ENDOCRINE  Hyperglycemia  P:   - SSI - Q4H Glucose checks   NEUROLOGIC A:   Right MCA/ACA s/p middle cerebral artery embolism  P:   - Per Neurology  - Repeat CT 11/12 noted - Frequent Neuro Exams  - PRN Fentanyl    FAMILY  - Updates: no family at beside 11/16 am.   - Inter-disciplinary family meet or Palliative Care meeting due by:  10/30/2016    Dirk Dress, NP 10/29/2016  9:05 AM Pager: [phone]  Attending Note:  82 year old male with vascular history and a-fib presenting with ICA/MCA ischemic stroke s/p revascularization who presents to NICU intubated.  On exam, patient is not following command with a weak gag, not extubateable given mental status.  I reviewed CXR myself, ETT in good  position.  Spoke with Dr. Pearlean Brownie and family, ready for trach/peg.  Will perform trach/peg today.  Will proceed with active weaning, anticipate will proceed to [facility] by AM.  Will need neurology to elaborate when patient can be placed for get social work to see.  The patient is critically ill with multiple organ systems failure and requires high complexity decision making for assessment and support, frequent evaluation and titration of therapies, application of advanced monitoring technologies and extensive interpretation of multiple databases.   Critical Care Time devoted to patient care services described in this  note is  35  Minutes. This time reflects time of care of this signee Dr Koren BoundWesam Carnell Beavers. This critical care time does not reflect procedure time, or teaching time or supervisory time of PA/NP/Med student/Med Resident etc but could involve care discussion time.  Alyson ReedyWesam G. Kaoir Loree, M.D. [facility] Pulmonary/Critical Care Medicine. Pager: [phone]. After hours pager: [phone].

## 2016-10-29 NOTE — Progress Notes (Signed)
eLink Physician-Brief Progress Note Patient Name: Luis AbedKong Arneson DOB: [birth year] MRN: [id number]   Date of Service  10/29/2016  HPI/Events of Note  Oliguria - Bladder scan with residual of 230 mL.   eICU Interventions  Will order: 1. I/O cath PRN.      Intervention Category Intermediate Interventions: Oliguria - evaluation and management  Bobetta Korf Eugene 10/29/2016, 3:58 PM

## 2016-10-29 NOTE — Procedures (Signed)
Bronchoscopy Procedure Note Luis AbedKong Cuevas [id number] April 08, 1926  Procedure: Bronchoscopy Indications: Diagnostic evaluation of the airways  Procedure Details Consent: Risks of procedure as well as the alternatives and risks of each were explained to the (patient/caregiver).  Consent for procedure obtained. Time Out: Verified patient identification, verified procedure, site/side was marked, verified correct patient position, special equipment/implants available, medications/allergies/relevent history reviewed, required imaging and test results available.  Performed  In preparation for procedure, patient was given 100% FiO2 and bronchoscope lubricated. Sedation: Benzodiazepines  Airway entered and the following bronchi were examined: Bronchi.   Procedures performed: Brushings performed - no Bronchoscope removed.  , Patient placed back on 100% FiO2 at conclusion of procedure.    Evaluation Hemodynamic Status: BP stable throughout; O2 sats: stable throughout Patient's Current Condition: stable Specimens:  None Complications: No apparent complications Patient did tolerate procedure well.   Nelda BucksFEINSTEIN,Jadien [place]. 10/29/2016   1. Placed bronch through ett, backed up to 18 cm, never lost airway, noted placement of needle, white cath, dilators and trach, without injury Placed after through trach , all qnl, no bleeding  Mcarthur Rossettianiel J. Tyson AliasFeinstein, MD, FACP Pgr: [phone] [place] [facility]

## 2016-10-29 NOTE — Op Note (Addendum)
[facility] Patient Name: Sean [name] Procedure Date : 10/29/2016 MRN: [id number] Attending MD: Violeta Gelinas , MD Date of Birth: September 26, 1926 CSN: [id number] Age: 82 Admit Type: Inpatient Procedure:                Upper GI endoscopy Indications:              Place PEG, Place PEG due to neurological disorder                            causing impaired swallowing, Place PEG because                            patient is unable to eat due to stroke (CVA) Providers:                Violeta Gelinas, MD Referring MD:              Medicines:                Fentanyl 100 micrograms IV, Midazolam 2 mg IV Complications:            No immediate complications. Estimated blood loss:                            Minimal. Estimated Blood Loss:      Procedure:                Pre-Anesthesia Assessment:                           - Prior to the procedure, a History and Physical                            was performed, and patient medications and                            allergies were reviewed. The patient is unable to                            give consent secondary to the patient's altered                            mental status. The risks and benefits of the                            procedure and the sedation options and risks were                            discussed with the patient. All questions were                            answered and informed consent was obtained. Patient                            identification and proposed procedure were verified  by the physician, the nurse and the technician.                            Mental Status Examination: lethargic. Airway                            Examination: tracheostomy via ventilator.                            Respiratory Examination: clear to auscultation. CV                            Examination: irregularly irregular rate and rhythm.                            ASA Grade Assessment: III - A  patient with severe                            systemic disease. After reviewing the risks and                            benefits, the patient was deemed in satisfactory                            condition to undergo the procedure. The anesthesia                            plan was to use deep sedation / analgesia.                            Immediately prior to administration of medications,                            the patient was re-assessed for adequacy to receive                            sedatives. The heart rate, respiratory rate, oxygen                            saturations, blood pressure, adequacy of pulmonary                            ventilation, and response to care were monitored                            throughout the procedure. The physical status of                            the patient was re-assessed after the procedure.                           - The anesthesia plan was to use deep  sedation/analgesia.                           After obtaining informed consent, the endoscope was                            passed under direct vision. Throughout the                            procedure, the patient's blood pressure, pulse, and                            oxygen saturations were monitored continuously.The                            upper GI endoscopy was accomplished without                            difficulty. The patient tolerated the procedure                            well. The patient tolerated the procedure well. The                            [id number] ([id number]) scope was introduced through the                            mouth, and advanced to the second part of duodenum. Scope In: Scope Out: Findings:      No gross lesions were noted in the entire esophagus.      No gross lesions were noted in the stomach.      No gross lesions were noted in the second portion of the duodenum. Impression:               - No gross lesions in  esophagus.                           - No gross lesions in the stomach.                           - No gross lesions in the second portion of the                            duodenum.                           - No specimens collected.                           - An externally removable PEG placement was                            successfully completed. Recommendation:            Procedure Code(s):        --- Professional ---  [id number], Esophagogastroduodenoscopy, flexible,                            transoral; diagnostic, including collection of                            specimen(s) by brushing or washing, when performed                            (separate procedure) Diagnosis Code(s):        --- Professional ---                           [id number], Other sequelae of cerebral infarction                           R63.3, Feeding difficulties                           Z43.1, Encounter for attention to gastrostomy CPT copyright 2016 [employer]. All rights reserved. The codes documented in this report are preliminary and upon coder review may  be revised to meet current compliance requirements. Violeta GelinasBurke Luby Seamans, MD 10/29/2016 1:34:44 PM This report has been signed electronically. Number of Addenda: 0

## 2016-10-29 NOTE — Progress Notes (Signed)
eLink Physician-Brief Progress Note Patient Name: Sean [name] DOB: Apr 18, 1926 MRN: [id number]   Date of Service  10/29/2016  HPI/Events of Note  Request to renew restraint orders.   eICU Interventions  Will renew soft wrist restraint orders.      Intervention Category Minor Interventions: Agitation / anxiety - evaluation and management  Sommer,Steven Eugene 10/29/2016, 9:21 PM

## 2016-10-29 NOTE — Progress Notes (Signed)
Patient ID: Sean [name], male   DOB: 09/09/1926, 82 y.o.   MRN: [id number] For PEG today at bedside.  Weaning on vent. ABD soft, NT. Consent completed.  Violeta GelinasBurke Mykle Pascua, MD, MPH, FACS Trauma: [phone] General Surgery: [phone]  10/29/2016 8:59 AM

## 2016-10-29 NOTE — Procedures (Signed)
Percutaneous Tracheostomy Placement  Consent from family.  Patient sedated, paralyzed and position.  Placed on 100% FiO2 and RR matched.  Area cleaned and draped.  Lidocaine/epi injected.  Skin incision done followed by blunt dissection.  Trachea palpated then punctured, catheter passed and visualized bronchoscopically.  Wire placed and visualized.  Catheter removed.  Airway then crushed and dilated.  Size 8 cuffed shiley trach placed and visualized bronchoscopically well above carina.  Good volume returns.  Patient tolerated the procedure well without complications.  Minimal blood loss.  CXR ordered and pending.  Simran Mannis G. Lilybeth Vien, M.D. [facility]. Pager: [phone]. After hours pager: [phone].  

## 2016-10-29 NOTE — Progress Notes (Signed)
Physical Therapy Treatment Patient Details Name: Sean AbedKong [name] MRN: [id number] DOB: 07-Oct-1926 Today's Date: 10/29/2016    History of Present Illness Sean [name] is a 82 y.o. male admitted with He has expressive aphasia and Not moving left side with right-sided gaze deviation. MRI: Areas of acute infarction in the right MCA territory involving the insula, frontal operculum, anteromedial temporal lobe, and lateral temporal parietal junction s/p revascularization.    PT Comments    Patient sedated and drowsy from procedure this morning. Able to sit EOB with total A. Pt able to visibly track to midline and even to the left x1. Able to move BLEs with painful stimulus. Continues to demonstrate appropriate righting reactions to the right. Difficult to assess cognition today due to decreased level of arousal and language barrier. Will follow.  Follow Up Recommendations  CIR     Equipment Recommendations  Other (comment) (TBA)    Recommendations for Other Services       Precautions / Restrictions Precautions Precautions: Fall Precaution Comments: trach Restrictions Weight Bearing Restrictions: No    Mobility  Bed Mobility Overal bed mobility: Needs Assistance;+2 for physical assistance;+ 2 for safety/equipment Bed Mobility: Supine to Sit;Sit to Supine     Supine to sit: Total assist;+2 for physical assistance;+2 for safety/equipment Sit to supine: Total assist;+2 for physical assistance;+2 for safety/equipment   General bed mobility comments: No assistance from pt.   Transfers                    Ambulation/Gait                 Stairs            Wheelchair Mobility    Modified Rankin (Stroke Patients Only)       Balance Overall balance assessment: Needs assistance Sitting-balance support: Feet supported;No upper extremity supported Sitting balance-Leahy Scale: Zero Sitting balance - Comments: Demonstrates good righting reactions when falling to the  right; but no righting reactions when falling backwards or to the left. Focused on better alignment of cervical spine and scapula.  Postural control: Posterior lean;Left lateral lean                          Cognition Arousal/Alertness: Lethargic (on paralytics from trach insertion earlier in AM) Behavior During Therapy: Flat affect Overall Cognitive Status: Difficult to assess                 General Comments: Family not present during session so difficult to determine if pt able to follow commands however very sedated from procedure this AM.    Exercises      General Comments General comments (skin integrity, edema, etc.): Withdraws from stimulus of BLEs and moving BLEs.      Pertinent Vitals/Pain Pain Assessment: Faces Faces Pain Scale: No hurt    Home Living                      Prior Function            PT Goals (current goals can now be found in the care plan section) Progress towards PT goals: Not progressing toward goals - comment (secondary to decreased level of arousal)    Frequency    Min 3X/week      PT Plan Current plan remains appropriate    Co-evaluation             End of Session Equipment Utilized  During Treatment: Other (comment) (trach, vent) Activity Tolerance: Patient limited by lethargy Patient left: in bed;with call bell/phone within reach;with bed alarm set;with restraints reapplied     Time: [id number] PT Time Calculation (min) (ACUTE ONLY): 19 min  Charges:  $Therapeutic Activity: 8-22 mins                    G Codes:      Georgianna Band A Luby Seamans 10/29/2016, 2:39 PM Mylo RedShauna Zerline Melchior, PT, DPT [phone]

## 2016-10-29 NOTE — Progress Notes (Signed)
Pt's urine output was 100 ml at 1500 this shift.  Pt's urine output has been adequate.  Bladder scan showed 230 ml.  MD notified and in and out was ordered and performed.  In and out resulted in 250 ml of urine, MD notified regarding malodorous, dark amber, blood clot.  Urine culture ordered.

## 2016-10-29 NOTE — Progress Notes (Signed)
STROKE TEAM PROGRESS NOTE   SUBJECTIVE (INTERVAL HISTORY) Had another low grade fever overnight 101.4, no white count. Grand daughter and wife at bedside  OBJECTIVE Temp:  [98.3 F (36.8 C)-101.7 F (38.7 C)] 101.4 F (38.6 C) (11/16 0800) Pulse Rate:  [49-91] 66 (11/16 1030) Cardiac Rhythm: Atrial fibrillation (11/16 0800) Resp:  [16-24] 20 (11/16 1030) BP: (103-173)/(53-96) 135/69 (11/16 1030) SpO2:  [96 %-100 %] 100 % (11/16 1030) FiO2 (%):  [40 %] 40 % (11/16 0803) Weight:  [197 lb 5 oz (89.5 kg)] 197 lb 5 oz (89.5 kg) (11/16 0500)  CBC:  Recent Labs Lab 10/23/16 0345  10/24/16 0500  10/28/16 0326 10/29/16 0943  WBC 6.1  --  6.5  < > 9.3 9.1  NEUTROABS 3.6  --  4.7  --   --   --   HGB 13.4  < > 9.7*  < > 10.9* 9.3*  HCT 40.1  < > 29.7*  < > 33.3* 28.2*  MCV 87.0  --  86.3  < > 88.6 88.4  PLT 114*  --  109*  < > 122* 120*  < > = values in this interval not displayed.  Basic Metabolic Panel:   Recent Labs Lab 10/27/16 0250 10/28/16 0326  NA 143 141  K 3.9 3.5  CL 116* 104  CO2 22 28  GLUCOSE 180* 127*  BUN 15 12  CREATININE 0.62 0.60*  CALCIUM 8.0* 8.3*  MG 2.0 1.9  PHOS 2.9 3.9    Lipid Panel:     Component Value Date/Time   CHOL 104 10/24/2016 0719   TRIG 111 10/24/2016 0719   HDL 29 (L) 10/24/2016 0719   CHOLHDL 3.6 10/24/2016 0719   VLDL 22 10/24/2016 0719   LDLCALC 53 10/24/2016 0719   HgbA1c:  Lab Results  Component Value Date   HGBA1C 6.2 (H) 10/24/2016   Urine Drug Screen:     Component Value Date/Time   LABOPIA NONE DETECTED 10/23/2016 0628   COCAINSCRNUR NONE DETECTED 10/23/2016 0628   LABBENZ NONE DETECTED 10/23/2016 0628   AMPHETMU NONE DETECTED 10/23/2016 0628   THCU NONE DETECTED 10/23/2016 0628   LABBARB NONE DETECTED 10/23/2016 0628      IMAGING I have personally reviewed the radiological images below and agree with the radiology interpretations.  DG Chest Portable - 1 View 10/25/2016 1. Stable support apparatus. 2.  Increased interstitial markings on the right versus the left, more focal in the medial right base. This could represent asymmetric edema. Recommend attention on follow-up.  Ct Angio Head and Neck W Or Wo Contrast 10/23/2016 1. Occlusion of the right internal carotid artery just distal to the carotid bifurcation  2. Severely diminished opacification of the right middle cerebral artery, in keeping with acute right MCA infarct and the reported clinical left-sided symptoms.  3. No hemodynamically significant stenosis of the left carotid or vertebral arteries.   Ct Cerebral Perfusion W Contrast 10/23/2016 Motion during the perfusion scan compromises the obtained data. The calculated mismatch volume of 193 mL is likely unreliable and is not expected to provide accurate assessment of the ischemic penumbra.   Ct Head Code Stroke W/o Cm 10/23/2016 1. No acute intracranial hemorrhage.  2. ASPECTS is 10.  No CT evidence of acute cortical infarct.   Cerebral Angiogram S/P Rt common carotid ,lt common caroptid anr RT vert artery angiograms,follwed by complete revascularization of occluded RT ICA,RT MCA and RT ACA with x 4 passes with solitaire 4mm x 40 mm retrieval device ,  x 1pass with the 064 ACE aspiration catheter and 7.2 mg of INTEGRELIN   with a TICI 2B reperfusion.  MRI Head Wo Contrast 10/23/2016 1. Areas of acute infarction in the right MCA territory involving the insula, frontal operculum, anteromedial temporal lobe, and lateral temporal parietal junction. No evidence for hemorrhagic conversion. 2. Sequelae of small chronic hemorrhagic infarcts in the right cerebellum and central distribution chronic microhemorrhage likely hypertensive in etiology. 3. Advanced chronic microvascular ischemic changes and parenchymal volume loss.  Ct Head head  10/25/2016 IMPRESSION: Progressive low-density has developed in the right insular and frontal region and in the right temporal and parietal region  consistent with completed infarction in those regions. Mild swelling but no hemorrhage or shift.   EEG 10/26/16 1.  diffuse cerebral dysfunction that is non-specific in etiology and can be seen with hypoxic/ischemic injury, toxic/metabolic encephalopathies, neurodegenerative disorders, or medication effect.   2.  Structural or other physiologic abnormality in the right hemisphere, such as in setting of a stroke in that region.   TTE 11/13 Left ventricle: The cavity size was normal. There was mild focal   basal hypertrophy of the septum. Systolic function was normal.   The estimated ejection fraction was in the range of 55% to 60%.   Wall motion was normal; there were no regional wall motion   abnormalities.'   PHYSICAL EXAM  Temp:  [98.3 F (36.8 C)-101.7 F (38.7 C)] 101.4 F (38.6 C) (11/16 0800) Pulse Rate:  [49-91] 66 (11/16 1030) Resp:  [16-24] 20 (11/16 1030) BP: (103-173)/(53-96) 135/69 (11/16 1030) SpO2:  [96 %-100 %] 100 % (11/16 1030) FiO2 (%):  [40 %] 40 % (11/16 0803) Weight:  [197 lb 5 oz (89.5 kg)] 197 lb 5 oz (89.5 kg) (11/16 0500)  General - Well nourished, well developed elderly asian male, intubated. Opened eyes today when asked.   Ophthalmologic - Fundi not visualized due to noncooperation.  Cardiovascular - irregularly irregular heart rate and rhythm.  Neuro - intubated, follows some commands. Pupil unequal sizes, reactive but sluggish. Moves right side spontaneously. Withdraws left arm and left leg to pain. Sensation, coordination, and gait not tested.   ASSESSMENT/PLAN [name] is a 82 y.o. male with history of hypertension, hyperlipidemia, and atrial fibrillation on Pradaxa presenting with left hemiplegia and speech difficulties.Marland Kitchen. He did not receive IV t-PA due to anticoagulation. Had mechanical thrombectomy.  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  Resultant  Intubated, left  Hemiparesis.   MRI - acute infarction in the right MCA territory   CTA H&N - acute right MCA infarct. Occlusion of the right ICA.  Repeat CT 10/25/2016 - evolving right MCA infarct, no hemorrhage, no midline shift  2D Echo - unremarkable  LDL - 53  HgbA1c - 6.2 - prediabetes  VTE prophylaxis - subcutaneous heparin Diet NPO time specified  Pradaxa (dabigatran) twice a day prior to admission, now on ASA. Hold off anticoagulation due to moderately large right MCA infarct.  Ongoing aggressive stroke risk factor management  Therapy recommendations: CIR  Disposition:  pending  Trach and PEG today. cont PT/OT after that.   Right ICA occlusion  Likely due to A. fib noncompliance with Pradaxa  S/p mechanical thrombectomy  BP goal <180.    Fever No signs of infection. No white count.  Will get UA, Blood culture. CXr. Hold abx for now   Chronic A. fib  On  Pradaxa at home however missing doses one day PTA  Hold off Pradaxa due to moderately large right MCA infarct  Consider resume Pradaxa in one week if no procedure planned  Respiratory failure 2/2 to stroke  Intubated.   CCM on board  Will do trach tomorrow as patient's current mental status will make him unsuccessful for extubation.   Hypertension  Stable   BP goal < 180 due to recanalization of right ICA to avoid hyperperfusion syndrome  Avoid hypotension  Hyperlipidemia  Home meds:  Niacin - not resumed.  LDL 53, goal < 70  Nutrition  NPO  Gastric tube 10/23/2016  Feeding supplement 45 cc / hr - started 10/24/2016  PEG tube today.   Other Stroke Risk Factors  Advanced age  Other Active Problems  Hypokalemia - replete as needed  S/p bilateral cataract surgery  Anemia - stable around 10s.  Thrombocytopenia -stable.  Hospital day # 6   Hyacinth Meekerasrif Ahmed, M.D. PGY-3 I have personally examined this patient, reviewed notes, independently  viewed imaging studies, participated in medical decision making and plan of care.ROS completed by me personally and pertinent positives fully documented  I have made any additions or clarifications directly to the above note. Agree with note above.Plan trach and peg today. D/w grand daughter and answered questions  This patient is critically ill and at significant risk of neurological worsening, death and care requires constant monitoring of vital signs, hemodynamics,respiratory and cardiac monitoring, extensive review of multiple databases, frequent neurological assessment, discussion with family, other specialists and medical decision making of high complexity.I have made any additions or clarifications directly to the above note.This critical care time does not reflect procedure time, or teaching time or supervisory time of PA/NP/Med Resident etc but could involve care discussion time.  I spent 30 minutes of neurocritical care time  in the care of  this patient.      Delia HeadyPramod Khair Chasteen, MD Medical Director [facility] Pager: [phone] 10/29/2016 11:57 AM

## 2016-10-29 NOTE — Progress Notes (Signed)
Pt scheduled for trach and peg procedures.  Pt received  200 mg fentanyl, 4 mg versed, 10 mg vecuronium, 20 mg of etomidate prior to trach procedure per MD .  Pt receive 50 mg fentanyl, 2 mg versed, and 10 mg vecuronium prior to peg procedure per MD .  Family at bedside after procedures.  VSS.

## 2016-10-29 NOTE — Procedures (Signed)
Bedside Tracheostomy Insertion Procedure Note   Patient Details:   Name: Sean [name] DOB: 04-Aug-1926 MRN: [id number]  Procedure: Tracheostomy  Pre Procedure Assessment: ET Tube Size:7.5 ET Tube secured at lip (cm): 23 Bite block in place: Yes Breath Sounds: Clear  Post Procedure Assessment: BP 135/69   Pulse 66   Temp (!) 101.4 F (38.6 C) (Axillary)   Resp 20   Ht 5\' 7"  (1.702 m)   Wt 197 lb 5 oz (89.5 kg)   SpO2 100%   BMI 30.90 kg/m  O2 sats: stable throughout Complications: No apparent complications Patient did tolerate procedure well Tracheostomy Brand:Shiley Tracheostomy Style:Cuffed Tracheostomy Size: 8.0 Tracheostomy Secured WUJ:[id number]:Sutures Tracheostomy Placement Confirmation:Trach cuff visualized and in place and Chest X ray ordered for placement    Lysbeth PennerSmith, Jamee Keach [facility] 10/29/2016, 11:22 AM

## 2016-10-30 ENCOUNTER — Encounter (HOSPITAL_COMMUNITY): Payer: Self-pay | Admitting: General Surgery

## 2016-10-30 LAB — BASIC METABOLIC PANEL
Anion gap: 9 (ref 5–15)
BUN: 20 mg/dL (ref 6–20)
CALCIUM: 8 mg/dL — AB (ref 8.9–10.3)
CO2: 26 mmol/L (ref 22–32)
CREATININE: 0.69 mg/dL (ref 0.61–1.24)
Chloride: 103 mmol/L (ref 101–111)
GFR calc non Af Amer: >60 mL/min (ref >60)
Glucose, Bld: 166 mg/dL — ABNORMAL HIGH (ref 65–99)
Potassium: 3.3 mmol/L — ABNORMAL LOW (ref 3.5–5.1)
SODIUM: 138 mmol/L (ref 135–145)

## 2016-10-30 LAB — CBC
HCT: 29.7 % — ABNORMAL LOW (ref 39.0–52.0)
Hemoglobin: 9.6 g/dL — ABNORMAL LOW (ref 13.0–17.0)
MCH: 28.8 pg (ref 26.0–34.0)
MCHC: 32.3 g/dL (ref 30.0–36.0)
MCV: 89.2 fL (ref 78.0–100.0)
Platelets: 155 10*3/uL (ref 150–400)
RBC: 3.33 MIL/uL — ABNORMAL LOW (ref 4.22–5.81)
RDW: 15.2 % (ref 11.5–15.5)
WBC: 6.7 10*3/uL (ref 4.0–10.5)

## 2016-10-30 LAB — URINE CULTURE: Culture: NO GROWTH

## 2016-10-30 LAB — PHOSPHORUS: PHOSPHORUS: 3.5 mg/dL (ref 2.5–4.6)

## 2016-10-30 LAB — GLUCOSE, CAPILLARY
GLUCOSE-CAPILLARY: 152 mg/dL — AB (ref 65–99)
Glucose-Capillary: 154 mg/dL — ABNORMAL HIGH (ref 65–99)
Glucose-Capillary: 175 mg/dL — ABNORMAL HIGH (ref 65–99)
Glucose-Capillary: 186 mg/dL — ABNORMAL HIGH (ref 65–99)
Glucose-Capillary: 146 mg/dL — ABNORMAL HIGH (ref 65–99)
Glucose-Capillary: 175 mg/dL — ABNORMAL HIGH (ref 65–99)

## 2016-10-30 LAB — MAGNESIUM: MAGNESIUM: 2 mg/dL (ref 1.7–2.4)

## 2016-10-30 MED ORDER — LABETALOL HCL 5 MG/ML IV SOLN
10.0000 mg | INTRAVENOUS | Status: DC | PRN
Start: 2016-10-30 — End: 2016-11-20
  Administered 2016-10-30: 10 mg via INTRAVENOUS
  Filled 2016-10-30: qty 4

## 2016-10-30 MED ORDER — POTASSIUM CHLORIDE 20 MEQ/15ML (10%) PO SOLN
ORAL | Status: AC
  Filled 2016-10-30: qty 30

## 2016-10-30 MED ORDER — POTASSIUM CHLORIDE 20 MEQ/15ML (10%) PO SOLN
40.0000 meq | Freq: Once | ORAL | Status: AC
  Administered 2016-10-30: 40 meq via ORAL

## 2016-10-30 MED ORDER — FUROSEMIDE 10 MG/ML IJ SOLN
40.0000 mg | Freq: Once | INTRAMUSCULAR | Status: AC
  Administered 2016-10-30: 40 mg via INTRAVENOUS
  Filled 2016-10-30: qty 4

## 2016-10-30 MED ORDER — FUROSEMIDE 10 MG/ML IJ SOLN
40.0000 mg | Freq: Once | INTRAMUSCULAR | Status: AC
Start: 2016-10-30 — End: 2016-10-30
  Administered 2016-10-30: 40 mg via INTRAVENOUS
  Filled 2016-10-30: qty 4

## 2016-10-30 MED ORDER — DEXTROSE 5 % IV SOLN
1.0000 g | INTRAVENOUS | Status: AC
  Administered 2016-10-30 – 2016-11-04 (×6): 1 g via INTRAVENOUS
  Filled 2016-10-30 (×6): qty 10

## 2016-10-30 MED ORDER — VITAL AF 1.2 CAL PO LIQD
1000.0000 mL | ORAL | Status: DC
  Administered 2016-10-30 – 2016-11-02 (×5): 1000 mL
  Filled 2016-10-30 (×7): qty 1000

## 2016-10-30 MED ORDER — POTASSIUM CHLORIDE CRYS ER 20 MEQ PO TBCR: 40.0000 meq | EXTENDED_RELEASE_TABLET | Freq: Once | ORAL | Status: DC

## 2016-10-30 NOTE — Care Management Important Message (Signed)
Important Message  Patient Details  Name: Sean [name] MRN: [id number] Date of Birth: April 02, 1926   Medicare Important Message Given:  Other (see comment)    Letroy Vazguez Abena 10/30/2016, 10:03 AM

## 2016-10-30 NOTE — Progress Notes (Signed)
eLink Physician-Brief Progress Note Patient Name: Luis AbedKong Schunk DOB: [birth year] MRN: [id number]   Date of Service  10/30/2016  HPI/Events of Note  Notified by RN Of note when necessary blood pressure medications & contradicting order and no clarification for goal systolic blood pressure.   eICU Interventions  1. Goal systolic blood pressure 160-120 2. Labetalol IV when necessary every 2 hours to maintain systolic blood pressure goal      Intervention Category Intermediate Interventions: Hypertension - evaluation and management  Lawanda CousinsJennings Nestor 10/30/2016, 10:45 PM

## 2016-10-30 NOTE — Progress Notes (Signed)
Orthopedic Tech Progress Note Patient Details:  Sean AbedKong [name] 22-May-1926 [id number] Brace completed by bio-tech Patient ID: Sean AbedKong [name], male   DOB: 22-May-1926, 82 y.o.   MRN: [id number]   Jennye MoccasinHughes, Sean [name] 10/30/2016, 5:25 PM

## 2016-10-30 NOTE — Progress Notes (Signed)
eLink Physician-Brief Progress Note Patient Name: Sean [name] Diana DOB: August 23, 1926 MRN: [id number]   Date of Service  10/30/2016  HPI/Events of Note  Oliguria Pitting edema  eICU Interventions  Lasix 40mg  x1     Intervention Category Intermediate Interventions: Oliguria - evaluation and management  Max FickleDouglas Kamal Jurgens 10/30/2016, 12:55 AM

## 2016-10-30 NOTE — Progress Notes (Signed)
Orthopedic Tech Progress Note Patient Details:  Sean [name] [name] August 28, 1926 [id number] Called [employer]tech for brace order. Patient ID: Sean [name] Sean [name], male   DOB: August 28, 1926, 82 y.o.   MRN: [id number]   Jennye MoccasinHughes, Sadey Yandell Craig 10/30/2016, 2:58 PM

## 2016-10-30 NOTE — Progress Notes (Signed)
STROKE TEAM PROGRESS NOTE   SUBJECTIVE (INTERVAL HISTORY) Afebrile overnight. Had trach and peg tube yesterday. Tolerating weaning this am so far  OBJECTIVE Temp:  [97.5 F (36.4 C)-100.5 F (38.1 C)] 99.1 F (37.3 C) (11/17 0325) Pulse Rate:  [53-76] 71 (11/17 0700) Cardiac Rhythm: Atrial fibrillation (11/17 0400) Resp:  [15-20] 19 (11/17 0700) BP: (103-151)/(51-92) 146/92 (11/17 0700) SpO2:  [96 %-100 %] 96 % (11/17 0700) FiO2 (%):  [40 %-100 %] 40 % (11/17 0535) Weight:  [197 lb 8.5 oz (89.6 kg)] 197 lb 8.5 oz (89.6 kg) (11/17 0500)  CBC:  Recent Labs Lab 10/24/16 0500  10/29/16 0943 10/30/16 0529  WBC 6.5  < > 9.1 6.7  NEUTROABS 4.7  --   --   --   HGB 9.7*  < > 9.3* 9.6*  HCT 29.7*  < > 28.2* 29.7*  MCV 86.3  < > 88.4 89.2  PLT 109*  < > 120* 155  < > = values in this interval not displayed.  Basic Metabolic Panel:   Recent Labs Lab 10/28/16 0326 10/30/16 0529  NA 141 138  K 3.5 3.3*  CL 104 103  CO2 28 26  GLUCOSE 127* 166*  BUN 12 20  CREATININE 0.60* 0.69  CALCIUM 8.3* 8.0*  MG 1.9 2.0  PHOS 3.9 3.5    Lipid Panel:     Component Value Date/Time   CHOL 104 10/24/2016 0719   TRIG 111 10/24/2016 0719   HDL 29 (L) 10/24/2016 0719   CHOLHDL 3.6 10/24/2016 0719   VLDL 22 10/24/2016 0719   LDLCALC 53 10/24/2016 0719   HgbA1c:  Lab Results  Component Value Date   HGBA1C 6.2 (H) 10/24/2016   Urine Drug Screen:     Component Value Date/Time   LABOPIA NONE DETECTED 10/23/2016 0628   COCAINSCRNUR NONE DETECTED 10/23/2016 0628   LABBENZ NONE DETECTED 10/23/2016 0628   AMPHETMU NONE DETECTED 10/23/2016 0628   THCU NONE DETECTED 10/23/2016 0628   LABBARB NONE DETECTED 10/23/2016 0628      IMAGING I have personally reviewed the radiological images below and agree with the radiology interpretations.  DG Chest Portable - 1 View 10/25/2016 1. Stable support apparatus. 2. Increased interstitial markings on the right versus the left, more focal in  the medial right base. This could represent asymmetric edema. Recommend attention on follow-up.  Ct Angio Head and Neck W Or Wo Contrast 10/23/2016 1. Occlusion of the right internal carotid artery just distal to the carotid bifurcation  2. Severely diminished opacification of the right middle cerebral artery, in keeping with acute right MCA infarct and the reported clinical left-sided symptoms.  3. No hemodynamically significant stenosis of the left carotid or vertebral arteries.   Ct Cerebral Perfusion W Contrast 10/23/2016 Motion during the perfusion scan compromises the obtained data. The calculated mismatch volume of 193 mL is likely unreliable and is not expected to provide accurate assessment of the ischemic penumbra.   Ct Head Code Stroke W/o Cm 10/23/2016 1. No acute intracranial hemorrhage.  2. ASPECTS is 10.  No CT evidence of acute cortical infarct.   Cerebral Angiogram S/P Rt common carotid ,lt common caroptid anr RT vert artery angiograms,follwed by complete revascularization of occluded RT ICA,RT MCA and RT ACA with x 4 passes with solitaire 4mm x 40 mm retrieval device ,x 1pass with the 064 ACE aspiration catheter and 7.2 mg of INTEGRELIN   with a TICI 2B reperfusion.  MRI Head Wo Contrast 10/23/2016 1. Areas  of acute infarction in the right MCA territory involving the insula, frontal operculum, anteromedial temporal lobe, and lateral temporal parietal junction. No evidence for hemorrhagic conversion. 2. Sequelae of small chronic hemorrhagic infarcts in the right cerebellum and central distribution chronic microhemorrhage likely hypertensive in etiology. 3. Advanced chronic microvascular ischemic changes and parenchymal volume loss.  Ct Head head  10/25/2016 IMPRESSION: Progressive low-density has developed in the right insular and frontal region and in the right temporal and parietal region consistent with completed infarction in those regions. Mild swelling but no  hemorrhage or shift.   EEG 10/26/16 1.  diffuse cerebral dysfunction that is non-specific in etiology and can be seen with hypoxic/ischemic injury, toxic/metabolic encephalopathies, neurodegenerative disorders, or medication effect.   2.  Structural or other physiologic abnormality in the right hemisphere, such as in setting of a stroke in that region.   TTE 11/13 Left ventricle: The cavity size was normal. There was mild focal   basal hypertrophy of the septum. Systolic function was normal.   The estimated ejection fraction was in the range of 55% to 60%.   Wall motion was normal; there were no regional wall motion   abnormalities.'  cxr 11/16 - left lung effusion.   PHYSICAL EXAM  Temp:  [97.5 F (36.4 C)-100.5 F (38.1 C)] 99.1 F (37.3 C) (11/17 0325) Pulse Rate:  [53-76] 71 (11/17 0700) Resp:  [15-20] 19 (11/17 0700) BP: (103-151)/(51-92) 146/92 (11/17 0700) SpO2:  [96 %-100 %] 96 % (11/17 0700) FiO2 (%):  [40 %-100 %] 40 % (11/17 0535) Weight:  [197 lb 8.5 oz (89.6 kg)] 197 lb 8.5 oz (89.6 kg) (11/17 0500)  General - Well nourished, well developed elderly asian male.s/p trachesotomy  On trac collar  Ophthalmologic - Fundi not visualized due to noncooperation.  Cardiovascular - irregularly irregular heart rate and rhythm.  Neuro -trached, follows some commands. Pupil unequal sizes, reactive but sluggish. Moves right side spontaneously. Withdraws left arm and left leg to pain. Sensation, coordination, and gait not tested.   ASSESSMENT/PLAN Sean [name] is a 82 y.o. male with history of hypertension, hyperlipidemia, and atrial fibrillation on Pradaxa presenting with left hemiplegia and speech difficulties.Marland Kitchen. He did not receive IV t-PA due to anticoagulation. Had mechanical thrombectomy.  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  Resultant  left  Hemiparesis.    MRI - acute infarction in the right MCA territory   CTA H&N - acute right MCA infarct. Occlusion of the right ICA.  Repeat CT 10/25/2016 - evolving right MCA infarct, no hemorrhage, no midline shift  2D Echo - unremarkable  LDL - 53  HgbA1c - 6.2 - prediabetes  VTE prophylaxis - subcutaneous heparin Diet NPO time specified  Pradaxa (dabigatran) twice a day prior to admission, now on ASA. Hold off anticoagulation due to moderately large right MCA infarct.  Ongoing aggressive stroke risk factor management  Therapy recommendations: CIR  Disposition:  pending  Has trach and peg. Cont to work with PT/OT.   Right ICA occlusion  Likely due to A. fib noncompliance with Pradaxa  S/p mechanical thrombectomy  BP goal <180.    Fever UA showing nitrites. ucx pending. Afebrile currently. No white count. cxr showing left effusion, could be due to extra volume.  F/up cultures.  Will start ceftx.  Hypervolemia - has effusion on cxr and some pitting edema along with low urine output. - s/p lasix. Follow strict  I/o. Prn I/o cath.  -repeat lasix.   Chronic A. fib  On Pradaxa at home however missing doses one day PTA  Hold off Pradaxa due to moderately large right MCA infarct  Consider resume today?  Respiratory failure 2/2 to stroke + some hypervolemia now.  trached. Wean when possible.   CCM on board   Hypertension  Stable   BP goal < 180 due to recanalization of right ICA to avoid hyperperfusion syndrome  Avoid hypotension  Hyperlipidemia  Home meds:  Niacin - not resumed.  LDL 53, goal < 70  Nutrition  NPO  Gastric tube 10/23/2016  Feeding supplement 45 cc / hr - started 10/24/2016  PEG placed.   Other Stroke Risk Factors  Advanced age  Other Active Problems  Hypokalemia - replete as needed  S/p bilateral cataract surgery  Anemia - stable around 10s.  Thrombocytopenia -stable.  Hospital day # 7   Hyacinth Meekerasrif Ahmed, M.D. PGY-3  I have  personally examined this patient, reviewed notes, independently viewed imaging studies, participated in medical decision making and plan of care.ROS completed by me personally and pertinent positives fully documented  I have made any additions or clarifications directly to the above note. Agree with note above. Continue weaning and start peg feeds. No family at bedside today This patient is critically ill and at significant risk of neurological worsening, death and care requires constant monitoring of vital signs, hemodynamics,respiratory and cardiac monitoring, extensive review of multiple databases, frequent neurological assessment, discussion with family, other specialists and medical decision making of high complexity.I have made any additions or clarifications directly to the above note.This critical care time does not reflect procedure time, or teaching time or supervisory time of PA/NP/Med Resident etc but could involve care discussion time.  I spent 30 minutes of neurocritical care time  in the care of  this patient.     Delia HeadyPramod Jamonica Schoff, MD Medical Director [facility] Pager: [phone] 10/30/2016 3:50 PM

## 2016-10-30 NOTE — Consult Note (Signed)
WOC Nurse wound consult note Reason for Consult: trach pressure Wound type: No wound present. Nurse consulted WOC nurse to evaluate skin under trach for potential pressure. Skin is intact but high potential for skin breakdown due to tightness of sutures and neck circumference  Pressure Ulcer POA: No Dressing procedure/placement/frequency: silicone bordered foam dressing cut to fit and placed under the superior and inferior aspects of trach. Change as needed for soilage   Durel SaltsKeatah Brooks FNP-C [employer] student   Discussed POC with bedside nurse.  Re consult if needed, will not follow at this time. Thanks  Landa Mullinax M.D.[employer] MSN, RN,CWOCN, CNS [phone])

## 2016-10-30 NOTE — Progress Notes (Signed)
PULMONARY / CRITICAL CARE MEDICINE   Name: Sean [name] MRN: [id number] DOB: December 08, 1926    ADMISSION DATE:  10/23/2016 CONSULTATION DATE:  10/23/16  REFERRING MD: Dr. Ranae PalmsYelverton   CHIEF COMPLAINT: Acute onset of left hemiplegia and aphasia  BRIEF SUMMARY: 82 y/o M admitted 11/10 with c/o AMS, no movement on left side, unable to speak.  Found to have R ICA occlusion / MCA CVA.  To Neuro IR for thrombectomy, returned to ICU on vent.    SUBJECTIVE:  Lasix 40 x 1 overnight for decreased urine output. Tolerating Weaning on 35 % TC at present.   VITAL SIGNS: BP (!) 146/92   Pulse 71   Temp 99.1 F (37.3 C) (Axillary)   Resp 19   Ht 5\' 7"  (1.702 m)   Wt 197 lb 8.5 oz (89.6 kg)   SpO2 96%   BMI 30.94 kg/m   HEMODYNAMICS:    VENTILATOR SETTINGS: Vent Mode: PSV FiO2 (%):  [40 %-100 %] 40 % Set Rate:  [16 bmp-20 bmp] 16 bmp Vt Set:  [520 mL] 520 mL PEEP:  [5 cmH20] 5 cmH20 Pressure Support:  [8 cmH20] 8 cmH20 Plateau Pressure:  [14 cmH20-19 cmH20] 15 cmH20  INTAKE / OUTPUT: I/O last 3 completed shifts: In: 3113.2 [I.V.:1800; NG/GT:1263.2; IV Piggyback:50] Out: 3075 [Urine:2575; Emesis/NG output:500]  PHYSICAL EXAMINATION: General: Elderly male in bed on TC, does not open eyes to voice but does withdraw right hand to pain.Does not speak english Neuro: does not open eyes to command but does to pain and does withdraw right hand to pain.  Gag intact. decreased movement left arm HEENT: ETT, mm pink/moist Cardiovascular: s1/s2 regular, a fib, trace edema Lungs: Breath sounds coarse bilaterally, thick tan, copious amounts secretions  Abdomen: Non tender, non distended, active bowel sounds  Musculoskeletal: no acute deformities.  Skin: Warm, dry, intact  LABS:  BMET  Recent Labs Lab 10/27/16 0250 10/28/16 0326 10/30/16 0529  NA 143 141 138  K 3.9 3.5 3.3*  CL 116* 104 103  CO2 22 28 26   BUN 15 12 20   CREATININE 0.62 0.60* 0.69  GLUCOSE 180* 127* 166*     Electrolytes  Recent Labs Lab 10/27/16 0250 10/28/16 0326 10/30/16 0529  CALCIUM 8.0* 8.3* 8.0*  MG 2.0 1.9 2.0  PHOS 2.9 3.9 3.5    CBC  Recent Labs Lab 10/28/16 0326 10/29/16 0943 10/30/16 0529  WBC 9.3 9.1 6.7  HGB 10.9* 9.3* 9.6*  HCT 33.3* 28.2* 29.7*  PLT 122* 120* 155    Coag's  Recent Labs Lab 10/28/16 1119  APTT 33  INR 1.13    Sepsis Markers No results for input(s): LATICACIDVEN, PROCALCITON, O2SATVEN in the last 168 hours.  ABG  Recent Labs Lab 10/23/16 1533 10/27/16 0401 10/28/16 0425  PHART 7.493* 7.443 7.490*  PCO2ART 28.2* 32.0 33.7  PO2ART 174* 111* 75.5*   Liver Enzymes No results for input(s): AST, ALT, ALKPHOS, BILITOT, ALBUMIN in the last 168 hours. Cardiac Enzymes No results for input(s): TROPONINI, PROBNP in the last 168 hours.  Glucose  Recent Labs Lab 10/29/16 1211 10/29/16 1522 10/29/16 1948 10/29/16 2347 10/30/16 0305 10/30/16 0800  GLUCAP 134* 117* 110* 160* 154* 175*   Imaging Dg Chest Port 1 View  Result Date: 10/29/2016 CLINICAL DATA:  82 y/o  M; tracheostomy and PEG tube today. EXAM: PORTABLE CHEST 1 VIEW COMPARISON:  10/28/2016 chest radiograph FINDINGS: Stable enlarged cardiac silhouette given projection and technique. Elevated right hemidiaphragm from prior study. Tracheostomy tube in situ.  Mild haziness of the left lung may represent underlying effusion. No focal consolidation. No acute osseous abnormality is evident. IMPRESSION: Interval tracheostomy placement. New elevation of right hemidiaphragm. Haziness of left lung may represent an underlying effusion. Electronically Signed   By: Mitzi HansenLance  Furusawa-Stratton M.D.   On: 10/29/2016 15:08   STUDIES:  -CTA Head and Neck 11/10 > Occlusion of the right internal carotid artery just distal to the carotid bifurcation. Severely diminished opacification of the right middle cerebral artery.  -CT Head 11/10 >Mild patchy areas of increased density in the right  cerebellum,right hippocampus, and posterior limb internal capsule the right  MRI 11/10 >> areas of acute infarct in the R MCA territory, no hemorrhagic conversion, likely hypertensive chronic micro hemorrhage in the right cerebellum  -ECHO 11/11 >> ef 55%  CULTURES:  ANTIBIOTICS:  SIGNIFICANT EVENTS: 11/10  Admit, IR for  Cerebral angiogram s/p middle cerebral artery embolism. 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 Taken to IR for recanalization of right ICA.    LINES/TUBES: ET tube 11/10 >>11/16 Left Radial Aline 11/10 >>out R Femoral Sheath 11/10 >> 11/11 Trach 11/16>> PEG 11/16>> DISCUSSION: 82 year old male admitted 11/10 with R ICA stroke s/p mechanical thrombectomy with evolving large R MCA infarct and acute respiratory failure.  Mental status has not allowed extubation.  Trach and PEG 11/16. Currently weaning at 35% TC, tolerating well.    ASSESSMENT / PLAN:  PULMONARY A: Acute Respiratory Failure secondary to CVA  P:   Trach 11/16, as mental status did not allow extubation. TC as tolerated, goal to 28% x 24 hours for SNF placement CXR 11/18  Thick tan secretions  Mobilize/PT/OT    CARDIOVASCULAR A:  H/O Afib, HTN HLD 11/13 echo ef 55% P:  - Keep Systolic 120-160 - Labetalol / hydralazine PRN for systolic > 120-160 - Hold home Pradaxa r/t large R MCA infarct  - hold home Lisinopril, and Niacin  - Cardiac monitoring  - ICU hemodynamic monitoring   RENAL A:   Hypokalemia  P:   - Replace electrolytes as needed ( Replaced 11/17) - Strick I & O - Daily BMP  - NS to 50 ml/hr  GASTROINTESTINAL A:   dysphagia  P:   - PPI - PEG 11/16  - TF @ goal per PEG  HEMATOLOGIC A:   Thrombocytopenia - unknown baseline, on chronic anticoagulation  Anemia. Improved. P:  - Trend CBC - Hold home Pradaxa  - SCDs   INFECTIOUS A: Fever without  Leukocytosis 11/17 ? Effusion Left base 11/16 UA with + Nitrates, Few bacteria P:   - check CXR, WBC 11/18 - Consider ABX - Sputum Culture 11/17 - Condom Cath/ Foley d/c  ENDOCRINE  Hyperglycemia  P:   - SSI - Q4H Glucose checks   NEUROLOGIC A:   Right MCA/ACA s/p middle cerebral artery embolism  P:   - Per Neurology  - Repeat CT 11/12 noted - Frequent Neuro Exams  - PRN Fentanyl    FAMILY  - Updates: no family at beside 11/17 am.   - Inter-disciplinary family meet or Palliative Care meeting due by:  10/30/2016    Bevelyn NgoSarah F. Caeley Dohrmann, AGACNP-BC [facility] 10/30/2016  8:29 AM Pager:  [phone]

## 2016-10-30 NOTE — Care Management Important Message (Signed)
Important Message  Patient Details  Name: Sean [name] MRN: [id number] Date of Birth: 09/30/1926   Medicare Important Message Given:  Other (see comment)    Coy Rochford Abena 10/30/2016, 10:03 AM 

## 2016-10-30 NOTE — Progress Notes (Signed)
Nutrition Follow-up  INTERVENTION:   - Increase Vital AF 1.2 to 70 mL/hr (1680 mL) to provide 2016 kcal (100% estimated energy needs), 126 grams protein (101% estimated protein needs), and 1361 mL free water daily.  NUTRITION DIAGNOSIS:   Inadequate oral intake related to inability to eat as evidenced by NPO status.  Ongoing.  GOAL:   Provide needs based on ASPEN/SCCM guidelines  Met with updated TF regimen.  MONITOR:   Labs, I & O's, TF tolerance  ASSESSMENT:   Pt admitted with acute onset of aphasia and left hemiplegia with occluded RT ICA, RT MCA, and RT ACA with complete revasuclaraztion.  Pt is s/p trach and PEG on 10/29/16 as mental status did not allow for extubation. Pt no longer on ventilator support. Per NP note, TC as tolerated, goal to 28% x 24 hours for SNF placement. Will update TF regimen based on re-estimated needs.  Pt with Vital AF 1.2 infusing @ goal rate of 65 mL/hr at time of visit.  Medications reviewed and include 20 mg Pepcid daily, heparin TID, sliding scale Novolog, PRN Dulcolax  Labs reviewed and include low potassium (3.3 mmol/L) CBG's: 110-175 mg/dL  Diet Order:  Diet NPO time specified  Skin:  Reviewed, no issues (incision)  Last BM:  Unknown  Height:   Ht Readings from Last 1 Encounters:  10/25/16 _0  (1.702 m)    Weight:   Wt Readings from Last 1 Encounters:  10/30/16 197 lb 8.5 oz (89.6 kg)    Ideal Body Weight:  80.9 kg  BMI:  Body mass index is 30.94 kg/m.  Estimated Nutritional Needs:   Kcal:  1900-2100  Protein:  108-125 grams  Fluid:  1.9-2.1 L/day  EDUCATION NEEDS:   No education needs identified at this time  Jeb Levering Dietetic Intern Pager Number: [phone]

## 2016-10-30 NOTE — Progress Notes (Signed)
Occupational Therapy Treatment Patient Details Name: Sean Sean [name] Sean [name] MRN: [id number] DOB: [birth year] Today's Date: 10/30/2016    History of present illness Sean Sean [name] is a 82 y.o. male admitted with He has expressive aphasia and Not moving left side with right-sided gaze deviation. MRI: Areas of acute infarction in the right MCA territory involving the insula, frontal operculum, anteromedial temporal lobe, and lateral temporal parietal junction s/p revascularization.   OT comments  This 82 yo male admitted with above presents to acute OT with making progress with following commands in Cong[place]inese and [place]. Still has a right gaze preference and flaccid LUE (I have asked for a resting hand splint due to edema). He will continue to benefit from acute OT with follow up on CIR.  Follow Up Recommendations  CIR;Supervision/Assistance - 24 hour    Equipment Recommendations  Other (comment) (TBD at next venue)       Precautions / Restrictions Precautions Precautions: Fall Precaution Comments: trach, peg Restrictions Weight Bearing Restrictions: No              ADL Overall ADL's : Needs assistance/impaired                                       General ADL Comments: total A       Vision                 Additional Comments: In supine pt opens his left eye intermittently to being asked to by family and when I turn his head to the left; his eyes are deviated to the right and could not get him to track. He shook his head yes when his wife asked him in Congohinese if he could see          Cognition   Behavior During Therapy: Flat affect Overall Cognitive Status: Difficult to assess                  General Comments: Family present (wife and son from out of state). With asking them to ask him to do things in Congohinese pt gave a thumbs up, thumbs down, removed a wash cloth from his face, moved his right leg, waved by to me. When asked if his name was Sean Sean [name] he shook  his head no, but then he would not shake his head yes or no to other names. When asked if he was tired he shook his head yes. When asked if wanted me to go bye bye (in [place]) he shook his head yes and mouthed bye bye.                 Pertinent Vitals/ Pain       Pain Assessment: Faces Faces Pain Scale: Hurts little more (he immediately opens his left eye when I turn his head to midline) Pain Location: neck Pain Descriptors / Indicators:  (see comments under faces pain scaled above) Pain Intervention(s): Repositioned;Monitored during session         Frequency  Min 3X/week        Progress Toward Goals  OT Goals(current goals can now be found in the care plan section)  Progress towards OT goals: Progressing toward goals     Plan Discharge plan remains appropriate       End of Session     Activity Tolerance Patient limited by fatigue   Patient Left in bed;with family/visitor present;with restraints reapplied (  all rails up, mitt to wrist restraint to right UE)   Nurse Communication  (need for left resting hand splint (2 hours on/2 hours off))        Time: 1416-1440 OT Time Calculation (min): 24 min  Charges: OT General Charges $OT Visit: 1 Procedure OT Treatments $Therapeutic Activity: 23-37 mins  Evette GeorgesLeonard, Sean Sean [name] [phone] 10/30/2016, 3:37 PM

## 2016-10-31 ENCOUNTER — Inpatient Hospital Stay (HOSPITAL_COMMUNITY): Payer: Medicare (Managed Care)

## 2016-10-31 DIAGNOSIS — I63011 Cerebral infarction due to thrombosis of right vertebral artery: Secondary | ICD-10-CM

## 2016-10-31 LAB — CBC
HCT: 29.5 % — ABNORMAL LOW (ref 39.0–52.0)
Hemoglobin: 9.4 g/dL — ABNORMAL LOW (ref 13.0–17.0)
MCH: 28.3 pg (ref 26.0–34.0)
MCHC: 31.9 g/dL (ref 30.0–36.0)
MCV: 88.9 fL (ref 78.0–100.0)
PLATELETS: 165 10*3/uL (ref 150–400)
RBC: 3.32 MIL/uL — ABNORMAL LOW (ref 4.22–5.81)
RDW: 14.9 % (ref 11.5–15.5)
WBC: 8.1 10*3/uL (ref 4.0–10.5)

## 2016-10-31 LAB — BASIC METABOLIC PANEL
Anion gap: 9 (ref 5–15)
BUN: 18 mg/dL (ref 6–20)
CALCIUM: 8 mg/dL — AB (ref 8.9–10.3)
CHLORIDE: 105 mmol/L (ref 101–111)
CO2: 25 mmol/L (ref 22–32)
CREATININE: 0.6 mg/dL — AB (ref 0.61–1.24)
GFR calc non Af Amer: >60 mL/min (ref >60)
GLUCOSE: 199 mg/dL — AB (ref 65–99)
Potassium: 3.6 mmol/L (ref 3.5–5.1)
Sodium: 139 mmol/L (ref 135–145)

## 2016-10-31 LAB — GLUCOSE, CAPILLARY
GLUCOSE-CAPILLARY: 186 mg/dL — AB (ref 65–99)
GLUCOSE-CAPILLARY: 171 mg/dL — AB (ref 65–99)
Glucose-Capillary: 156 mg/dL — ABNORMAL HIGH (ref 65–99)
Glucose-Capillary: 175 mg/dL — ABNORMAL HIGH (ref 65–99)
Glucose-Capillary: 178 mg/dL — ABNORMAL HIGH (ref 65–99)
Glucose-Capillary: 186 mg/dL — ABNORMAL HIGH (ref 65–99)

## 2016-10-31 MED ORDER — ACETYLCYSTEINE 20 % IN SOLN
4.0000 mL | Freq: Two times a day (BID) | RESPIRATORY_TRACT | Status: AC
  Administered 2016-10-31 – 2016-11-01 (×3): 4 mL via RESPIRATORY_TRACT
  Filled 2016-10-31 (×4): qty 4

## 2016-10-31 MED ORDER — PNEUMOCOCCAL VAC POLYVALENT 25 MCG/0.5ML IJ INJ
0.5000 mL | INJECTION | INTRAMUSCULAR | Status: AC
  Administered 2016-11-01: 0.5 mL via INTRAMUSCULAR
  Filled 2016-10-31: qty 0.5

## 2016-10-31 MED ORDER — ALBUTEROL SULFATE (2.5 MG/3ML) 0.083% IN NEBU
2.5000 mg | INHALATION_SOLUTION | RESPIRATORY_TRACT | Status: DC | PRN
  Administered 2016-10-31 – 2016-11-14 (×17): 2.5 mg via RESPIRATORY_TRACT
  Filled 2016-10-31 (×17): qty 3

## 2016-10-31 MED ORDER — HYDRALAZINE HCL 25 MG PO TABS
25.0000 mg | ORAL_TABLET | Freq: Three times a day (TID) | ORAL | Status: DC
  Administered 2016-10-31 – 2016-11-01 (×4): 25 mg via ORAL
  Filled 2016-10-31 (×4): qty 1

## 2016-10-31 MED ORDER — INFLUENZA VAC SPLIT QUAD 0.5 ML IM SUSY
0.5000 mL | PREFILLED_SYRINGE | INTRAMUSCULAR | Status: AC
  Administered 2016-11-01: 0.5 mL via INTRAMUSCULAR
  Filled 2016-10-31: qty 0.5

## 2016-10-31 NOTE — Progress Notes (Signed)
PULMONARY / CRITICAL CARE MEDICINE   Name: Sean [name] MRN: [id number] DOB: Oct 18, 1926    ADMISSION DATE:  10/23/2016 CONSULTATION DATE:  10/23/16  REFERRING MD: Dr. Ranae Palms   CHIEF COMPLAINT: Acute onset of left hemiplegia and aphasia  BRIEF SUMMARY: 82 y/o M admitted 11/10 with c/o AMS, no movement on left side, unable to speak.  Found to have R ICA occlusion / MCA CVA.  To Neuro IR for thrombectomy, returned to ICU on vent.    SUBJECTIVE:   Doing well on trach collar with adequate sats  VITAL SIGNS: BP (!) 153/95   Pulse 72   Temp (!) 101.2 F (38.4 C) (Axillary)   Resp (!) 21   Ht 5\' 7"  (1.702 m)   Wt 87.1 kg (192 lb 0.3 oz)   SpO2 97%   BMI 30.07 kg/m   HEMODYNAMICS:    VENTILATOR SETTINGS: FiO2 (%):  [28 %-30 %] 28 %  INTAKE / OUTPUT: I/O last 3 completed shifts: In: 3284 [I.V.:750; WU/JW:1191; IV Piggyback:100] Out: 5175 [Urine:5175]  PHYSICAL EXAMINATION: General: Elderly male in bed on TC, opens eyes .Does not speak english Neuro: does not open eyes to command but does to pain and does withdraw right hand to pain.  Gag intact. decreased movement left arm HEENT: mm pink/moist Neck : Trach midline w/ clean /dry dressing  Cardiovascular: s1/s2 regular, a fib, trace edema Lungs: Breath sounds coarse bilaterally, thick tan, copious amounts secretions  Abdomen: Non tender, non distended, active bowel sounds  Musculoskeletal: no acute deformities.  Skin: Warm, dry, intact  LABS:  BMET  Recent Labs Lab 10/28/16 0326 10/30/16 0529 10/31/16 0256  NA 141 138 139  K 3.5 3.3* 3.6  CL 104 103 105  CO2 28 26 25   BUN 12 20 18   CREATININE 0.60* 0.69 0.60*  GLUCOSE 127* 166* 199*    Electrolytes  Recent Labs Lab 10/27/16 0250 10/28/16 0326 10/30/16 0529 10/31/16 0256  CALCIUM 8.0* 8.3* 8.0* 8.0*  MG 2.0 1.9 2.0  --   PHOS 2.9 3.9 3.5  --     CBC  Recent Labs Lab 10/29/16 0943 10/30/16 0529 10/31/16 0256  WBC 9.1 6.7 8.1  HGB 9.3* 9.6*  9.4*  HCT 28.2* 29.7* 29.5*  PLT 120* 155 165    Coag's  Recent Labs Lab 10/28/16 1119  APTT 33  INR 1.13    Sepsis Markers No results for input(s): LATICACIDVEN, PROCALCITON, O2SATVEN in the last 168 hours.  ABG  Recent Labs Lab 10/27/16 0401 10/28/16 0425  PHART 7.443 7.490*  PCO2ART 32.0 33.7  PO2ART 111* 75.5*   Liver Enzymes No results for input(s): AST, ALT, ALKPHOS, BILITOT, ALBUMIN in the last 168 hours. Cardiac Enzymes No results for input(s): TROPONINI, PROBNP in the last 168 hours.  Glucose  Recent Labs Lab 10/30/16 1157 10/30/16 1557 10/30/16 1938 10/30/16 2328 10/31/16 0328 10/31/16 0738  GLUCAP 186* 152* 146* 175* 186* 175*   Imaging Dg Chest Port 1 View  Result Date: 10/31/2016 CLINICAL DATA:  Respiratory failure EXAM: PORTABLE CHEST 1 VIEW COMPARISON:  Two days ago FINDINGS: Both lower lobes were likely collapsed previously, improved currently although there is still hazy opacity. No generalized edema. No effusion or pneumothorax. Chronic cardiopericardial enlargement. Tracheostomy tube is well seated. Artifact from EKG leads over the chest. IMPRESSION: Interval re- inflation of the lower lobes. Hazy opacity over the bases could be residual atelectasis or infection. Electronically Signed   By: Marnee Spring M.D.   On: 10/31/2016 07:42  STUDIES:  -CTA Head and Neck 11/10 > Occlusion of the right internal carotid artery just distal to the carotid bifurcation. Severely diminished opacification of the right middle cerebral artery.  -CT Head 11/10 >Mild patchy areas of increased density in the right cerebellum,right hippocampus, and posterior limb internal capsule the right  MRI 11/10 >> areas of acute infarct in the R MCA territory, no hemorrhagic conversion, likely hypertensive chronic micro hemorrhage in the right cerebellum  -ECHO 11/11 >> ef 55%  CULTURES:  ANTIBIOTICS:  SIGNIFICANT EVENTS: 11/10  Admit, IR for  Cerebral angiogram s/p  middle cerebral artery embolism. 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 Taken to IR for recanalization of right ICA.    LINES/TUBES: ET tube 11/10 >>11/16 Left Radial Aline 11/10 >>out R Femoral Sheath 11/10 >> 11/11 Trach 11/16>> PEG 11/16>> DISCUSSION: 82 year old male admitted 11/10 with R ICA stroke s/p mechanical thrombectomy with evolving large R MCA infarct and acute respiratory failure.  Mental status has not allowed extubation.  Trach and PEG 11/16. Currently weaning at 35% TC, tolerating well.    ASSESSMENT / PLAN:  PULMONARY A: Acute Respiratory Failure secondary to CVA  P:   Trach 11/16, as mental status did not allow extubation. TC as tolerated, goal to 28% x 24 hours for SNF placement CXR 11/18  Thick tan secretions  Mobilize/PT/OT    CARDIOVASCULAR A:  H/O Afib, HTN HLD 11/13 echo ef 55% P:  - Keep Systolic 120-160 - Labetalol / hydralazine PRN for systolic > 120-160 - Hold home Pradaxa r/t large R MCA infarct  - hold  Lisinopril /Niacin  - Cardiac monitoring  - ICU hemodynamic monitoring   RENAL A:   Hypokalemia  P:   - Replace electrolytes as needed ( Replaced 11/17) - Strick I & O - Daily BMP  - NS to 50 ml/hr  GASTROINTESTINAL A:   dysphagia  P:   - PPI - PEG 11/16  - TF @ goal per PEG  HEMATOLOGIC A:   Thrombocytopenia - unknown baseline, on chronic anticoagulation  Anemia. Improved. P:  - Trend CBC - Hold home Pradaxa  - SCDs   INFECTIOUS A: Fever without Leukocytosis 11/17 ? Effusion Left base 11/16? PNA  UA with + Nitrates, Few bacteria P:   -  - Cont Rocphen  - Sputum Culture 11/17 - Condom Cath/ Foley d/c  ENDOCRINE  Hyperglycemia  P:   - SSI - Q4H Glucose checks   NEUROLOGIC A:   Right MCA/ACA s/p middle cerebral artery embolism  P:   - Per Neurology  - Repeat CT 11/12 noted - Frequent Neuro  Exams  - PRN Fentanyl    FAMILY  - Updates: no family at beside 11/17 am.   - Inter-disciplinary family meet or Palliative Care meeting due by:  10/30/2016  Tammy Parrett NP-C  [facility]  [phone]  10/31/2016   STAFF NOTE: Cindi CarbonI, Brigitta Pricer, MD FACP have personally reviewed patient's available data, including medical history, events of note, physical examination and test results as part of my evaluation. I have discussed with resident/NP and other care providers such as pharmacist, RN and RRT. In addition, I personally evaluated patient and elicited key findings of:  Remains on TC, mild secretions, Rt base atx apparent better aeration, add mucomyst and chest pt, HTn remains, far from bleed, add hydral, to sdu, triad as primary,  will stay as pulm for trach care  Mcarthur Rossettianiel J. Tyson AliasFeinstein, MD, FACP Pgr: [phone] [facility] 10/31/2016 11:42 AM

## 2016-10-31 NOTE — Progress Notes (Signed)
STROKE TEAM PROGRESS NOTE   SUBJECTIVE (INTERVAL HISTORY) No changes. Tolerated trach collar overnight/ Tolerating Peg feeds  OBJECTIVE Temp:  [97.9 F (36.6 C)-101.2 F (38.4 C)] 101.2 F (38.4 C) (11/18 0800) Pulse Rate:  [65-94] 72 (11/18 1000) Cardiac Rhythm: Atrial fibrillation (11/18 0800) Resp:  [11-33] 21 (11/18 1000) BP: (120-192)/(61-104) 153/95 (11/18 1000) SpO2:  [94 %-99 %] 97 % (11/18 1000) FiO2 (%):  [28 %-30 %] 28 % (11/18 0842) Weight:  [192 lb 0.3 oz (87.1 kg)] 192 lb 0.3 oz (87.1 kg) (11/18 0500)  CBC:   Recent Labs Lab 10/30/16 0529 10/31/16 0256  WBC 6.7 8.1  HGB 9.6* 9.4*  HCT 29.7* 29.5*  MCV 89.2 88.9  PLT 155 165    Basic Metabolic Panel:   Recent Labs Lab 10/28/16 0326 10/30/16 0529 10/31/16 0256  NA 141 138 139  K 3.5 3.3* 3.6  CL 104 103 105  CO2 28 26 25   GLUCOSE 127* 166* 199*  BUN 12 20 18   CREATININE 0.60* 0.69 0.60*  CALCIUM 8.3* 8.0* 8.0*  MG 1.9 2.0  --   PHOS 3.9 3.5  --     Lipid Panel:     Component Value Date/Time   CHOL 104 10/24/2016 0719   TRIG 111 10/24/2016 0719   HDL 29 (L) 10/24/2016 0719   CHOLHDL 3.6 10/24/2016 0719   VLDL 22 10/24/2016 0719   LDLCALC 53 10/24/2016 0719   HgbA1c:  Lab Results  Component Value Date   HGBA1C 6.2 (H) 10/24/2016   Urine Drug Screen:     Component Value Date/Time   LABOPIA NONE DETECTED 10/23/2016 0628   COCAINSCRNUR NONE DETECTED 10/23/2016 0628   LABBENZ NONE DETECTED 10/23/2016 0628   AMPHETMU NONE DETECTED 10/23/2016 0628   THCU NONE DETECTED 10/23/2016 0628   LABBARB NONE DETECTED 10/23/2016 0628      IMAGING I have personally reviewed the radiological images below and agree with the radiology interpretations.  DG Chest Portable - 1 View 10/25/2016 1. Stable support apparatus. 2. Increased interstitial markings on the right versus the left, more focal in the medial right base. This could represent asymmetric edema. Recommend attention on  follow-up.  Ct Angio Head and Neck W Or Wo Contrast 10/23/2016 1. Occlusion of the right internal carotid artery just distal to the carotid bifurcation  2. Severely diminished opacification of the right middle cerebral artery, in keeping with acute right MCA infarct and the reported clinical left-sided symptoms.  3. No hemodynamically significant stenosis of the left carotid or vertebral arteries.   Ct Cerebral Perfusion W Contrast 10/23/2016 Motion during the perfusion scan compromises the obtained data. The calculated mismatch volume of 193 mL is likely unreliable and is not expected to provide accurate assessment of the ischemic penumbra.   Ct Head Code Stroke W/o Cm 10/23/2016 1. No acute intracranial hemorrhage.  2. ASPECTS is 10.  No CT evidence of acute cortical infarct.   Cerebral Angiogram S/P Rt common carotid ,lt common caroptid anr RT vert artery angiograms,follwed by complete revascularization of occluded RT ICA,RT MCA and RT ACA with x 4 passes with solitaire 4mm x 40 mm retrieval device ,x 1pass with the 064 ACE aspiration catheter and 7.2 mg of INTEGRELIN   with a TICI 2B reperfusion.  MRI Head Wo Contrast 10/23/2016 1. Areas of acute infarction in the right MCA territory involving the insula, frontal operculum, anteromedial temporal lobe, and lateral temporal parietal junction. No evidence for hemorrhagic conversion. 2. Sequelae of small chronic  hemorrhagic infarcts in the right cerebellum and central distribution chronic microhemorrhage likely hypertensive in etiology. 3. Advanced chronic microvascular ischemic changes and parenchymal volume loss.  Ct Head head  10/25/2016 IMPRESSION: Progressive low-density has developed in the right insular and frontal region and in the right temporal and parietal region consistent with completed infarction in those regions. Mild swelling but no hemorrhage or shift.   EEG 10/26/16 1.  diffuse cerebral dysfunction that is  non-specific in etiology and can be seen with hypoxic/ischemic injury, toxic/metabolic encephalopathies, neurodegenerative disorders, or medication effect.   2.  Structural or other physiologic abnormality in the right hemisphere, such as in setting of a stroke in that region.   TTE 11/13 Left ventricle: The cavity size was normal. There was mild focal   basal hypertrophy of the septum. Systolic function was normal.   The estimated ejection fraction was in the range of 55% to 60%.   Wall motion was normal; there were no regional wall motion   abnormalities.'  cxr 11/16 - left lung effusion.   PHYSICAL EXAM  Temp:  [97.9 F (36.6 C)-101.2 F (38.4 C)] 101.2 F (38.4 C) (11/18 0800) Pulse Rate:  [65-94] 72 (11/18 1000) Resp:  [11-33] 21 (11/18 1000) BP: (120-192)/(61-104) 153/95 (11/18 1000) SpO2:  [94 %-99 %] 97 % (11/18 1000) FiO2 (%):  [28 %-30 %] 28 % (11/18 0842) Weight:  [192 lb 0.3 oz (87.1 kg)] 192 lb 0.3 oz (87.1 kg) (11/18 0500)  General - Well nourished, well developed elderly asian male.s/p trachesotomy  On trac collar  Ophthalmologic - Fundi not visualized due to noncooperation.  Cardiovascular - irregularly irregular heart rate and rhythm.  Neuro -trached, follows some commands. Pupil unequal sizes, reactive but sluggish. Moves right side spontaneously. Withdraws left arm and left leg to pain. Sensation, coordination, and gait not tested.   ASSESSMENT/PLAN [name] is a 82 y.o. male with history of hypertension, hyperlipidemia, and atrial fibrillation on Pradaxa presenting with left hemiplegia and speech difficulties.Marland Kitchen. He did not receive IV t-PA due to anticoagulation. Had mechanical thrombectomy.  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  Resultant  left  Hemiparesis.   MRI - acute infarction in the right MCA territory   CTA H&N - acute right  MCA infarct. Occlusion of the right ICA.  Repeat CT 10/25/2016 - evolving right MCA infarct, no hemorrhage, no midline shift  2D Echo - unremarkable  LDL - 53  HgbA1c - 6.2 - prediabetes  VTE prophylaxis - subcutaneous heparin Diet NPO time specified  Pradaxa (dabigatran) twice a day prior to admission, now on ASA. Hold off anticoagulation due to moderately large right MCA infarct.  Ongoing aggressive stroke risk factor management  Therapy recommendations: CIR  Disposition:  pending  Has trach and peg. Cont to work with PT/OT.   Right ICA occlusion  Likely due to A. fib noncompliance with Pradaxa  S/p mechanical thrombectomy  BP goal <180.    Fever UA showing nitrites. ucx pending. Afebrile currently. No white count. cxr showing left effusion, could be due to extra volume.  F/up cultures.  Will start ceftx.  Hypervolemia - has effusion on cxr and some pitting edema along with low urine output. - s/p lasix. Follow strict I/o. Prn I/o cath.  -repeat lasix.   Chronic A. fib  On Pradaxa at home however missing doses one day PTA  Hold off Pradaxa due to moderately large  right MCA infarct  Consider resume today?  Respiratory failure 2/2 to stroke + some hypervolemia now.  trached. Wean when possible.   CCM on board   Hypertension  Stable   BP goal < 180 due to recanalization of right ICA to avoid hyperperfusion syndrome  Avoid hypotension  Hyperlipidemia  Home meds:  Niacin - not resumed.  LDL 53, goal < 70  Nutrition  NPO  Gastric tube 10/23/2016  Feeding supplement 45 cc / hr - started 10/24/2016  PEG placed.   Other Stroke Risk Factors  Advanced age  Other Active Problems  Hypokalemia - replete as needed  S/p bilateral cataract surgery  Anemia - stable around 10s.  Thrombocytopenia -stable.  Hospital day # 8    I have personally examined this patient, reviewed notes, independently viewed imaging studies, participated in  medical decision making and plan of care.ROS completed by me personally and pertinent positives fully documented  I have made any additions or clarifications directly to the above note.   Continue weaning and   feeds. No family at bedside today.May transfer to floor bed if ok with PCCM. This patient is critically ill and at significant risk of neurological worsening, death and care requires constant monitoring of vital signs, hemodynamics,respiratory and cardiac monitoring, extensive review of multiple databases, frequent neurological assessment, discussion with family, other specialists and medical decision making of high complexity.I have made any additions or clarifications directly to the above note.This critical care time does not reflect procedure time, or teaching time or supervisory time of PA/NP/Med Resident etc but could involve care discussion time.  I spent 30 minutes of neurocritical care time  in the care of  this patient.     Delia Heady, MD Medical Director [facility] Pager: [phone] 10/31/2016 10:46 AM

## 2016-10-31 NOTE — Progress Notes (Signed)
Occupational Therapy Treatment Patient Details Name: Sean AbedKong Bessette MRN: [id number] DOB: June 22, 1926 Today's Date: 10/31/2016    History of present illness Sean [name] is a 82 y.o. male admitted with He has expressive aphasia and Not moving left side with right-sided gaze deviation. MRI: Areas of acute infarction in the right MCA territory involving the insula, frontal operculum, anteromedial temporal lobe, and lateral temporal parietal junction s/p revascularization.   OT comments  Pt with L resting hand splint donned ~2 hours. Doffed splint and performed skin check. No signs of pressure or skin breakdown noted. Notified RN and educated on splint wear schedule (2 hours on, 2 hours off). Will continue to follow acutely.   Follow Up Recommendations  CIR;Supervision/Assistance - 24 hour    Equipment Recommendations  Other (comment) (TBD at next venue)    Recommendations for Other Services      Precautions / Restrictions Precautions Precautions: Fall Precaution Comments: trach, peg Restrictions Weight Bearing Restrictions: No       Mobility Bed Mobility                  Transfers                      Balance                                   ADL                                                Vision                     Perception     Praxis      Cognition                             Extremity/Trunk Assessment               Exercises Other Exercises Other Exercises: Doffed LUE resting hand splint and assessed for signs of pressure or skin break down. No redness or areas of pressure noted. RN notified and educated on splint wear schedule. Other Exercises: Positioned LUE in elevation for edema management and protection.   Shoulder Instructions       General Comments      Pertinent Vitals/ Pain          Home Living                                          Prior  Functioning/Environment              Frequency  Min 3X/week        Progress Toward Goals  OT Goals(current goals can now be found in the care plan section)  Progress towards OT goals: Progressing toward goals  Acute Rehab OT Goals Patient Stated Goal: unable OT Goal Formulation: Patient unable to participate in goal setting  Plan Discharge plan remains appropriate    Co-evaluation                 End of Session Equipment Utilized During Treatment: Other (comment) (L resting hand splint)  Activity Tolerance Patient tolerated treatment well   Patient Left in bed;with family/visitor present   Nurse Communication          Time: [id number] OT Time Calculation (min): 8 min  Charges: OT General Charges $OT Visit: 1 Procedure OT Treatments $Orthotics/Prosthetics Check: 8-22 mins  Gaye AlkenBailey A Jiles Goya M.S., OTR/L Pager: [phone]  10/31/2016, 2:31 PM

## 2016-10-31 NOTE — Progress Notes (Addendum)
Occupational Therapy Treatment Patient Details Name: Sean [name] Goering MRN: [id number] DOB: Sep 28, 1926 Today's Date: 10/31/2016    History of present illness Sean [name] Kargbo is a 82 y.o. male admitted with He has expressive aphasia and Not moving left side with right-sided gaze deviation. MRI: Areas of acute infarction in the right MCA territory involving the insula, frontal operculum, anteromedial temporal lobe, and lateral temporal parietal junction s/p revascularization.   OT comments  Pt tolerated PROM LUE digits, wrist, elbow, shoulder x10 each. Donned L resting hand splint; will follow up for splint check in ~2 hours (RN aware). D/c plan remains appropriate. Will continue to follow acutely.   Follow Up Recommendations  CIR;Supervision/Assistance - 24 hour    Equipment Recommendations  Other (comment) (TBD at next venue)    Recommendations for Other Services      Precautions / Restrictions Precautions Precautions: Fall Precaution Comments: trach, peg       Mobility Bed Mobility                  Transfers                      Balance                                   ADL                                                Vision                     Perception     Praxis      Cognition                             Extremity/Trunk Assessment               Exercises Other Exercises Other Exercises: Performed PROM LUE digits, wrist, elbow, shoulder x 10 each. Other Exercises: Donned LUE resting hand splint; will follow up for splint check in ~2 hours (RN aware).   Shoulder Instructions       General Comments      Pertinent Vitals/ Pain          Home Living                                          Prior Functioning/Environment              Frequency  Min 3X/week        Progress Toward Goals  OT Goals(current goals can now be found in the care plan section)  Progress towards OT goals: Progressing toward goals  Acute Rehab OT Goals Patient Stated Goal: unable OT Goal Formulation: Patient unable to participate in goal setting  Plan Discharge plan remains appropriate    Co-evaluation                 End of Session Equipment Utilized During Treatment: Other (comment) (L resting hand splint)   Activity Tolerance Patient tolerated treatment well   Patient Left in bed   Nurse Communication  Time: 1152-1201 OT Time Calculation (min): 9 min  Charges: OT General Charges $OT Visit: 1 Procedure OT Treatments $Orthotics Fit/Training: 8-22 mins  Gaye AlkenBailey A Preslea Rhodus M.S., OTR/L Pager: [phone]  10/31/2016, 12:12 PM

## 2016-11-01 ENCOUNTER — Inpatient Hospital Stay (HOSPITAL_COMMUNITY): Payer: Medicare (Managed Care)

## 2016-11-01 LAB — CBC
HEMATOCRIT: 32 % — AB (ref 39.0–52.0)
HEMOGLOBIN: 10.4 g/dL — AB (ref 13.0–17.0)
MCH: 28.8 pg (ref 26.0–34.0)
MCHC: 32.5 g/dL (ref 30.0–36.0)
MCV: 88.6 fL (ref 78.0–100.0)
Platelets: 195 10*3/uL (ref 150–400)
RBC: 3.61 MIL/uL — AB (ref 4.22–5.81)
RDW: 14.9 % (ref 11.5–15.5)
WBC: 10.6 10*3/uL — ABNORMAL HIGH (ref 4.0–10.5)

## 2016-11-01 LAB — GLUCOSE, CAPILLARY
GLUCOSE-CAPILLARY: 169 mg/dL — AB (ref 65–99)
GLUCOSE-CAPILLARY: 165 mg/dL — AB (ref 65–99)
GLUCOSE-CAPILLARY: 184 mg/dL — AB (ref 65–99)
GLUCOSE-CAPILLARY: 168 mg/dL — AB (ref 65–99)
Glucose-Capillary: 166 mg/dL — ABNORMAL HIGH (ref 65–99)
Glucose-Capillary: 186 mg/dL — ABNORMAL HIGH (ref 65–99)

## 2016-11-01 LAB — BASIC METABOLIC PANEL
ANION GAP: 8 (ref 5–15)
BUN: 15 mg/dL (ref 6–20)
CHLORIDE: 104 mmol/L (ref 101–111)
CO2: 26 mmol/L (ref 22–32)
CREATININE: 0.61 mg/dL (ref 0.61–1.24)
Calcium: 8.5 mg/dL — ABNORMAL LOW (ref 8.9–10.3)
GFR calc non Af Amer: >60 mL/min (ref >60)
Glucose, Bld: 168 mg/dL — ABNORMAL HIGH (ref 65–99)
POTASSIUM: 3.6 mmol/L (ref 3.5–5.1)
SODIUM: 138 mmol/L (ref 135–145)

## 2016-11-01 LAB — CULTURE, RESPIRATORY W GRAM STAIN: Culture: NORMAL

## 2016-11-01 LAB — CULTURE, RESPIRATORY

## 2016-11-01 MED ORDER — PANTOPRAZOLE SODIUM 40 MG PO TBEC
40.0000 mg | DELAYED_RELEASE_TABLET | Freq: Every day | ORAL | Status: DC
  Administered 2016-11-02 – 2016-11-03 (×2): 40 mg via ORAL
  Filled 2016-11-01 (×3): qty 1

## 2016-11-01 MED ORDER — HYDRALAZINE HCL 50 MG PO TABS
50.0000 mg | ORAL_TABLET | Freq: Three times a day (TID) | ORAL | Status: DC
  Administered 2016-11-01 – 2016-11-05 (×11): 50 mg via ORAL
  Filled 2016-11-01 (×10): qty 1

## 2016-11-01 NOTE — Progress Notes (Deleted)
No acute changes, vital signs are stable. No PRN medication has been given this shift. BM today, no urine output. Bladder scan completed, 164 ml present. Splints have been of from 8 am to 10 am, and 2 pm to 4 pm this shift. Patient has been bathed and all dressings have been changed. Mother is concerned about patients arm being sore due to use during turning, will pass on information to oncoming shift. No questions or concerns at this time. Call bell in reach, family present, will continue to monitor. 

## 2016-11-01 NOTE — Progress Notes (Signed)
Patient resting quietly, no acute changes. Orders to stepdown unit but no bed available at this time. Family is at bedside. Patient bathed this morning, Has had very short periods of wakefulness and he responds better to family than staff. Patient turned appropriately, no indication of pain, slight elevation in temperature at noon time, PRN medication given. No questions or concerns at this time. Call bell in reach, will continue to monitor.

## 2016-11-01 NOTE — Progress Notes (Signed)
PULMONARY / CRITICAL CARE MEDICINE   Name: Sean [name] MRN: [id number] DOB: November 10, 1926    ADMISSION DATE:  10/23/2016 CONSULTATION DATE:  10/23/16  REFERRING MD: Dr. Ranae PalmsYelverton   CHIEF COMPLAINT: Acute onset of left hemiplegia and aphasia  BRIEF SUMMARY: 82 y/o M admitted 11/10 with c/o AMS, no movement on left side, unable to speak.  Found to have R ICA occlusion / MCA CVA.  To Neuro IR for thrombectomy, returned to ICU on vent.    SUBJECTIVE:   Doing well on trach collar with adequate sats-28%   VITAL SIGNS: BP (!) 162/81   Pulse 83   Temp 99.8 F (37.7 C) (Axillary)   Resp 18   Ht 5\' 7"  (1.702 m)   Wt 85.6 kg (188 lb 11.4 oz)   SpO2 96%   BMI 29.56 kg/m   HEMODYNAMICS:    VENTILATOR SETTINGS: FiO2 (%):  [28 %] 28 %  INTAKE / OUTPUT: I/O last 3 completed shifts: In: 2620 [NG/GT:2520; IV Piggyback:100] Out: 2250 [Urine:2250]  PHYSICAL EXAMINATION: General: Elderly male in bed on TC, opens eyes .Does not speak english Neuro: does not open eyes to command but does to pain and does withdraw right hand to pain.  Gag intact. decreased movement left arm HEENT: mm pink/moist Neck : Trach midline w/ clean /dry dressing  Cardiovascular: s1/s2 regular, a fib, trace edema Lungs: Breath sounds coarse bilaterally, thick tan, copious amounts secretions  Abdomen: Non tender, non distended, active bowel sounds  Musculoskeletal: no acute deformities.  Skin: Warm, dry, intact  LABS:  BMET  Recent Labs Lab 10/30/16 0529 10/31/16 0256 11/01/16 0129  NA 138 139 138  K 3.3* 3.6 3.6  CL 103 105 104  CO2 26 25 26   BUN 20 18 15   CREATININE 0.69 0.60* 0.61  GLUCOSE 166* 199* 168*    Electrolytes  Recent Labs Lab 10/27/16 0250 10/28/16 0326 10/30/16 0529 10/31/16 0256 11/01/16 0129  CALCIUM 8.0* 8.3* 8.0* 8.0* 8.5*  MG 2.0 1.9 2.0  --   --   PHOS 2.9 3.9 3.5  --   --     CBC  Recent Labs Lab 10/30/16 0529 10/31/16 0256 11/01/16 0129  WBC 6.7 8.1 10.6*   HGB 9.6* 9.4* 10.4*  HCT 29.7* 29.5* 32.0*  PLT 155 165 195    Coag's  Recent Labs Lab 10/28/16 1119  APTT 33  INR 1.13    Sepsis Markers No results for input(s): LATICACIDVEN, PROCALCITON, O2SATVEN in the last 168 hours.  ABG  Recent Labs Lab 10/27/16 0401 10/28/16 0425  PHART 7.443 7.490*  PCO2ART 32.0 33.7  PO2ART 111* 75.5*   Liver Enzymes No results for input(s): AST, ALT, ALKPHOS, BILITOT, ALBUMIN in the last 168 hours. Cardiac Enzymes No results for input(s): TROPONINI, PROBNP in the last 168 hours.  Glucose  Recent Labs Lab 10/31/16 1137 10/31/16 1608 10/31/16 1940 10/31/16 2344 11/01/16 0332 11/01/16 0755  GLUCAP 186* 171* 156* 178* 169* 165*   Imaging Dg Chest Port 1 View  Result Date: 11/01/2016 CLINICAL DATA:  Respiratory failure EXAM: PORTABLE CHEST 1 VIEW COMPARISON:  1 day prior FINDINGS: Tracheostomy appropriately positioned. Cardiomegaly accentuated by AP portable technique. Superior mediastinal soft tissue fullness is new and favored to be due to AP portable technique. No pleural effusion or pneumothorax. Shifting airspace opacities. Improved right infrahilar aeration with developing left perihilar and persistent left lower lobe airspace disease. IMPRESSION: Cardiomegaly with shifting airspace opacities, favoring alveolar edema. Multifocal infection could look similar. Electronically Signed  By: Jeronimo Greaves M.D.   On: 11/01/2016 07:13   STUDIES:  -CTA Head and Neck 11/10 > Occlusion of the right internal carotid artery just distal to the carotid bifurcation. Severely diminished opacification of the right middle cerebral artery.  -CT Head 11/10 >Mild patchy areas of increased density in the right cerebellum,right hippocampus, and posterior limb internal capsule the right  MRI 11/10 >> areas of acute infarct in the R MCA territory, no hemorrhagic conversion, likely hypertensive chronic micro hemorrhage in the right cerebellum  -ECHO 11/11 >>  ef 55%  CULTURES:  ANTIBIOTICS:  SIGNIFICANT EVENTS: 11/10  Admit, IR for  Cerebral angiogram s/p middle cerebral artery embolism. 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 Taken to IR for recanalization of right ICA.    LINES/TUBES: ET tube 11/10 >>11/16 Left Radial Aline 11/10 >>out R Femoral Sheath 11/10 >> 11/11 Trach 11/16>> PEG 11/16>> DISCUSSION: 82 year old male admitted 11/10 with R ICA stroke s/p mechanical thrombectomy with evolving large R MCA infarct and acute respiratory failure.  Mental status has not allowed extubation.  Trach and PEG 11/16. Currently weaning at 35% TC, tolerating well.    ASSESSMENT / PLAN:  PULMONARY A: Acute Respiratory Failure secondary to CVA  HCAP  P:   Trach 11/16, as mental status did not allow extubation. TC as tolerated, goal to 28% x 24 hours for SNF placement CXR 11/18  Thick tan secretions  Mobilize/PT/OT  Cont on IV abx /follow cx -may need to broaden with Vanc    CARDIOVASCULAR A:  H/O Afib, HTN HLD 11/13 echo ef 55% P:  - Labetalol As needed    hydralazine added 11/18  - Hold home Pradaxa r/t large R MCA infarct  - hold  Lisinopril /Niacin  - Cardiac monitoring  - ICU hemodynamic monitoring   RENAL A:   Hypokalemia -resolved  P:   - Replace electrolytes as needed  - Strick I & O - Daily BMP    GASTROINTESTINAL A:   dysphagia  P:   - PPI - PEG 11/16  - TF @ goal per PEG  HEMATOLOGIC A:   Thrombocytopenia - unknown baseline, on chronic anticoagulation  Anemia. Improved. P:  - Trend CBC - Hold home Pradaxa  - SCDs   INFECTIOUS A: Fever without Leukocytosis 11/17 ? Effusion Left base 11/16? PNA  UA with + Nitrates, Few bacteria P:   -  - Cont Rocphen  11/17 - Sputum Culture 11/17 - Condom Cath/ Foley d/c  ENDOCRINE  Hyperglycemia  P:   - SSI - Q4H Glucose checks    NEUROLOGIC A:   Right MCA/ACA s/p middle cerebral artery embolism  P:   - Per Neurology  - Repeat CT 11/12 noted - Frequent Neuro Exams  - PRN Fentanyl    FAMILY  - Updates: no family at beside 11/17 am.   - Inter-disciplinary family meet or Palliative Care meeting due by:  10/30/2016  Global : Awaiting SDU bed, to transfer to [facility] .Pulmonary to stay on for trach care/managment   Tammy Parrett NP-C  [facility]  [phone]   11/01/2016 11:08 AM  STAFF NOTE: Cindi Carbon, MD FACP have personally reviewed patient's available data, including medical history, events of note, physical examination and test results as part of my evaluation. I have discussed with resident/NP and other care providers such as pharmacist, RN and  RRT. In addition, I personally evaluated patient and elicited key findings of: *no changes in neurosatus, ronchi, pcxr has improved atx overall, not impressed that fevers from lungs, UA 16th also neg, send sputum if able, ceftriaxone started 17th , maintain for now, does not need vent support at this time, await sdu bed  Mcarthur Rossettianiel J. Tyson AliasFeinstein, MD, FACP Pgr: [phone] [facility] 11/01/2016 12:33 PM

## 2016-11-01 NOTE — Progress Notes (Signed)
STROKE TEAM PROGRESS NOTE   SUBJECTIVE (INTERVAL HISTORY) No changes. Tolerated trach collar now 2 days. Tolerating Peg feeds. No step down unit bed available yesterday  OBJECTIVE Temp:  [98.3 F (36.8 C)-100.1 F (37.8 C)] 99.8 F (37.7 C) (11/19 0800) Pulse Rate:  [65-92] 83 (11/19 1000) Cardiac Rhythm: Atrial fibrillation (11/19 0746) Resp:  [18-30] 18 (11/19 1000) BP: (128-182)/(65-102) 162/81 (11/19 1000) SpO2:  [95 %-100 %] 96 % (11/19 1000) FiO2 (%):  [28 %] 28 % (11/19 0430) Weight:  [188 lb 11.4 oz (85.6 kg)] 188 lb 11.4 oz (85.6 kg) (11/19 0355)  CBC:   Recent Labs Lab 10/31/16 0256 11/01/16 0129  WBC 8.1 10.6*  HGB 9.4* 10.4*  HCT 29.5* 32.0*  MCV 88.9 88.6  PLT 165 195    Basic Metabolic Panel:   Recent Labs Lab 10/28/16 0326 10/30/16 0529 10/31/16 0256 11/01/16 0129  NA 141 138 139 138  K 3.5 3.3* 3.6 3.6  CL 104 103 105 104  CO2 28 26 25 26   GLUCOSE 127* 166* 199* 168*  BUN 12 20 18 15   CREATININE 0.60* 0.69 0.60* 0.61  CALCIUM 8.3* 8.0* 8.0* 8.5*  MG 1.9 2.0  --   --   PHOS 3.9 3.5  --   --     Lipid Panel:     Component Value Date/Time   CHOL 104 10/24/2016 0719   TRIG 111 10/24/2016 0719   HDL 29 (L) 10/24/2016 0719   CHOLHDL 3.6 10/24/2016 0719   VLDL 22 10/24/2016 0719   LDLCALC 53 10/24/2016 0719   HgbA1c:  Lab Results  Component Value Date   HGBA1C 6.2 (H) 10/24/2016   Urine Drug Screen:     Component Value Date/Time   LABOPIA NONE DETECTED 10/23/2016 0628   COCAINSCRNUR NONE DETECTED 10/23/2016 0628   LABBENZ NONE DETECTED 10/23/2016 0628   AMPHETMU NONE DETECTED 10/23/2016 0628   THCU NONE DETECTED 10/23/2016 0628   LABBARB NONE DETECTED 10/23/2016 0628      IMAGING I have personally reviewed the radiological images below and agree with the radiology interpretations.  DG Chest Portable - 1 View 10/25/2016 1. Stable support apparatus. 2. Increased interstitial markings on the right versus the left, more focal in  the medial right base. This could represent asymmetric edema. Recommend attention on follow-up.  Ct Angio Head and Neck W Or Wo Contrast 10/23/2016 1. Occlusion of the right internal carotid artery just distal to the carotid bifurcation  2. Severely diminished opacification of the right middle cerebral artery, in keeping with acute right MCA infarct and the reported clinical left-sided symptoms.  3. No hemodynamically significant stenosis of the left carotid or vertebral arteries.   Ct Cerebral Perfusion W Contrast 10/23/2016 Motion during the perfusion scan compromises the obtained data. The calculated mismatch volume of 193 mL is likely unreliable and is not expected to provide accurate assessment of the ischemic penumbra.   Ct Head Code Stroke W/o Cm 10/23/2016 1. No acute intracranial hemorrhage.  2. ASPECTS is 10.  No CT evidence of acute cortical infarct.   Cerebral Angiogram S/P Rt common carotid ,lt common caroptid anr RT vert artery angiograms,follwed by complete revascularization of occluded RT ICA,RT MCA and RT ACA with x 4 passes with solitaire 4mm x 40 mm retrieval device ,x 1pass with the 064 ACE aspiration catheter and 7.2 mg of INTEGRELIN   with a TICI 2B reperfusion.  MRI Head Wo Contrast 10/23/2016 1. Areas of acute infarction in the right MCA  territory involving the insula, frontal operculum, anteromedial temporal lobe, and lateral temporal parietal junction. No evidence for hemorrhagic conversion. 2. Sequelae of small chronic hemorrhagic infarcts in the right cerebellum and central distribution chronic microhemorrhage likely hypertensive in etiology. 3. Advanced chronic microvascular ischemic changes and parenchymal volume loss.  Ct Head head  10/25/2016 IMPRESSION: Progressive low-density has developed in the right insular and frontal region and in the right temporal and parietal region consistent with completed infarction in those regions. Mild swelling but no  hemorrhage or shift.   EEG 10/26/16 1.  diffuse cerebral dysfunction that is non-specific in etiology and can be seen with hypoxic/ischemic injury, toxic/metabolic encephalopathies, neurodegenerative disorders, or medication effect.   2.  Structural or other physiologic abnormality in the right hemisphere, such as in setting of a stroke in that region.   TTE 11/13 Left ventricle: The cavity size was normal. There was mild focal   basal hypertrophy of the septum. Systolic function was normal.   The estimated ejection fraction was in the range of 55% to 60%.   Wall motion was normal; there were no regional wall motion   abnormalities.'  cxr 11/16 - left lung effusion.   PHYSICAL EXAM  Temp:  [98.3 F (36.8 C)-100.1 F (37.8 C)] 99.8 F (37.7 C) (11/19 0800) Pulse Rate:  [65-92] 83 (11/19 1000) Resp:  [18-30] 18 (11/19 1000) BP: (128-182)/(65-102) 162/81 (11/19 1000) SpO2:  [95 %-100 %] 96 % (11/19 1000) FiO2 (%):  [28 %] 28 % (11/19 0430) Weight:  [188 lb 11.4 oz (85.6 kg)] 188 lb 11.4 oz (85.6 kg) (11/19 0355)  General - Well nourished, well developed elderly asian male.s/p trachesotomy  On trac collar  Ophthalmologic - Fundi not visualized due to noncooperation.  Cardiovascular - irregularly irregular heart rate and rhythm.  Neuro -trached, follows some commands. Pupil unequal sizes, reactive but sluggish. Moves right side spontaneously. Withdraws left arm and left leg to pain. Sensation, coordination, and gait not tested.   ASSESSMENT/PLAN [name] is a 82 y.o. male with history of hypertension, hyperlipidemia, and atrial fibrillation on Pradaxa presenting with left hemiplegia and speech difficulties.Marland Kitchen He did not receive IV t-PA due to anticoagulation. Had mechanical thrombectomy.  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  Resultant  left  Hemiparesis.    MRI - acute infarction in the right MCA territory   CTA H&N - acute right MCA infarct. Occlusion of the right ICA.  Repeat CT 10/25/2016 - evolving right MCA infarct, no hemorrhage, no midline shift  2D Echo - unremarkable  LDL - 53  HgbA1c - 6.2 - prediabetes  VTE prophylaxis - subcutaneous heparin Diet NPO time specified  Pradaxa (dabigatran) twice a day prior to admission, now on ASA. Hold off anticoagulation due to moderately large right MCA infarct.  Ongoing aggressive stroke risk factor management  Therapy recommendations: CIR  Disposition:  pending  Has trach and peg. Cont to work with PT/OT.   Right ICA occlusion  Likely due to A. fib noncompliance with Pradaxa  S/p mechanical thrombectomy  BP goal <180.    Fever UA showing nitrites. ucx pending. Afebrile currently. No white count. cxr showing left effusion, could be due to extra volume.  F/up cultures.  Will start ceftx.  Hypervolemia - has effusion on cxr and some pitting edema along with low urine output. - s/p lasix. Follow strict I/o. Prn I/o cath.  -repeat lasix.  Chronic A. fib  On Pradaxa at home however missing doses one day PTA  Hold off Pradaxa due to moderately large right MCA infarct  Consider resume today?  Respiratory failure 2/2 to stroke + some hypervolemia now.  trached. Wean when possible.   CCM on board   Hypertension  Stable   BP goal < 180 due to recanalization of right ICA to avoid hyperperfusion syndrome  Avoid hypotension  Hyperlipidemia  Home meds:  Niacin - not resumed.  LDL 53, goal < 70  Nutrition  NPO  Gastric tube 10/23/2016  Feeding supplement 45 cc / hr - started 10/24/2016  PEG placed.   Other Stroke Risk Factors  Advanced age  Other Active Problems  Hypokalemia - replete as needed  S/p bilateral cataract surgery  Anemia - stable around 10s.  Thrombocytopenia -stable.  Hospital day # 9    I have personally examined this  patient, reviewed notes, independently viewed imaging studies, participated in medical decision making and plan of care.ROS completed by me personally and pertinent positives fully documented  I have made any additions or clarifications directly to the above note.   Continue weaning and   feeds. No family at bedside today.May transfer to step down bed when available. No family at bedside for discussion. D/W Dr Tyson AliasFeinstein. Greater than 50% time during this 35 minute visit was spent on coordination of care about his stroke.       Delia HeadyPramod Krisinda Giovanni, MD Medical Director [facility] Pager: [phone] 11/01/2016 11:16 AM

## 2016-11-02 ENCOUNTER — Inpatient Hospital Stay (HOSPITAL_COMMUNITY): Payer: Medicare (Managed Care)

## 2016-11-02 DIAGNOSIS — J189 Pneumonia, unspecified organism: Secondary | ICD-10-CM

## 2016-11-02 LAB — CBC
HCT: 30.7 % — ABNORMAL LOW (ref 39.0–52.0)
HEMOGLOBIN: 9.7 g/dL — AB (ref 13.0–17.0)
MCH: 27.7 pg (ref 26.0–34.0)
MCHC: 31.6 g/dL (ref 30.0–36.0)
MCV: 87.7 fL (ref 78.0–100.0)
PLATELETS: 215 10*3/uL (ref 150–400)
RBC: 3.5 MIL/uL — AB (ref 4.22–5.81)
RDW: 15.3 % (ref 11.5–15.5)
WBC: 10.3 10*3/uL (ref 4.0–10.5)

## 2016-11-02 LAB — BASIC METABOLIC PANEL
Anion gap: 9 (ref 5–15)
BUN: 17 mg/dL (ref 6–20)
CO2: 24 mmol/L (ref 22–32)
CREATININE: 0.57 mg/dL — AB (ref 0.61–1.24)
Calcium: 8.2 mg/dL — ABNORMAL LOW (ref 8.9–10.3)
Chloride: 104 mmol/L (ref 101–111)
Glucose, Bld: 183 mg/dL — ABNORMAL HIGH (ref 65–99)
POTASSIUM: 3.8 mmol/L (ref 3.5–5.1)
SODIUM: 137 mmol/L (ref 135–145)

## 2016-11-02 LAB — GLUCOSE, CAPILLARY
GLUCOSE-CAPILLARY: 177 mg/dL — AB (ref 65–99)
GLUCOSE-CAPILLARY: 161 mg/dL — AB (ref 65–99)
Glucose-Capillary: 167 mg/dL — ABNORMAL HIGH (ref 65–99)
Glucose-Capillary: 171 mg/dL — ABNORMAL HIGH (ref 65–99)
Glucose-Capillary: 182 mg/dL — ABNORMAL HIGH (ref 65–99)
Glucose-Capillary: 190 mg/dL — ABNORMAL HIGH (ref 65–99)

## 2016-11-02 MED ORDER — DABIGATRAN ETEXILATE MESYLATE 150 MG PO CAPS
150.0000 mg | ORAL_CAPSULE | Freq: Two times a day (BID) | ORAL | Status: DC
  Administered 2016-11-02: 150 mg via ORAL
  Filled 2016-11-02: qty 1

## 2016-11-02 NOTE — Progress Notes (Signed)
STROKE TEAM PROGRESS NOTE   SUBJECTIVE (INTERVAL HISTORY) No family at bedside. No neuro changes. Tolerated trach, off the vent. On TF via PEG. No more surgical procedure planned. Will resume pradaxa. Pending placement.  OBJECTIVE Temp:  [98.5 F (36.9 C)-100.8 F (38.2 C)] 100 F (37.8 C) (11/20 1922) Pulse Rate:  [85-99] 94 (11/20 1926) Cardiac Rhythm: Atrial fibrillation (11/20 1930) Resp:  [22-29] 24 (11/20 1926) BP: (131-167)/(70-90) 161/72 (11/20 1922) SpO2:  [94 %-99 %] 97 % (11/20 1926) FiO2 (%):  [28 %] 28 % (11/20 1922) Weight:  [197 lb 1.5 oz (89.4 kg)] 197 lb 1.5 oz (89.4 kg) (11/20 0500)  CBC:   Recent Labs Lab 11/01/16 0129 11/02/16 0310  WBC 10.6* 10.3  HGB 10.4* 9.7*  HCT 32.0* 30.7*  MCV 88.6 87.7  PLT 195 215    Basic Metabolic Panel:   Recent Labs Lab 10/28/16 0326 10/30/16 0529  11/01/16 0129 11/02/16 0310  NA 141 138  < > 138 137  K 3.5 3.3*  < > 3.6 3.8  CL 104 103  < > 104 104  CO2 28 26  < > 26 24  GLUCOSE 127* 166*  < > 168* 183*  BUN 12 20  < > 15 17  CREATININE 0.60* 0.69  < > 0.61 0.57*  CALCIUM 8.3* 8.0*  < > 8.5* 8.2*  MG 1.9 2.0  --   --   --   PHOS 3.9 3.5  --   --   --   < > = values in this interval not displayed.  Lipid Panel:     Component Value Date/Time   CHOL 104 10/24/2016 0719   TRIG 111 10/24/2016 0719   HDL 29 (L) 10/24/2016 0719   CHOLHDL 3.6 10/24/2016 0719   VLDL 22 10/24/2016 0719   LDLCALC 53 10/24/2016 0719   HgbA1c:  Lab Results  Component Value Date   HGBA1C 6.2 (H) 10/24/2016   Urine Drug Screen:     Component Value Date/Time   LABOPIA NONE DETECTED 10/23/2016 0628   COCAINSCRNUR NONE DETECTED 10/23/2016 0628   LABBENZ NONE DETECTED 10/23/2016 0628   AMPHETMU NONE DETECTED 10/23/2016 0628   THCU NONE DETECTED 10/23/2016 0628   LABBARB NONE DETECTED 10/23/2016 0628      IMAGING I have personally reviewed the radiological images below and agree with the radiology interpretations.  Ct  Angio Head and Neck W Or Wo Contrast 10/23/2016 1. Occlusion of the right internal carotid artery just distal to the carotid bifurcation  2. Severely diminished opacification of the right middle cerebral artery, in keeping with acute right MCA infarct and the reported clinical left-sided symptoms.  3. No hemodynamically significant stenosis of the left carotid or vertebral arteries.   Ct Cerebral Perfusion W Contrast 10/23/2016 Motion during the perfusion scan compromises the obtained data. The calculated mismatch volume of 193 mL is likely unreliable and is not expected to provide accurate assessment of the ischemic penumbra.   Ct Head Code Stroke W/o Cm 10/23/2016 1. No acute intracranial hemorrhage.  2. ASPECTS is 10.  No CT evidence of acute cortical infarct.   Cerebral Angiogram S/P Rt common carotid ,lt common caroptid anr RT vert artery angiograms,follwed by complete revascularization of occluded RT ICA,RT MCA and RT ACA with x 4 passes with solitaire 4mm x 40 mm retrieval device ,x 1pass with the 064 ACE aspiration catheter and 7.2 mg of INTEGRELIN   with a TICI 2B reperfusion.  MRI Head Wo Contrast 10/23/2016  1. Areas of acute infarction in the right MCA territory involving the insula, frontal operculum, anteromedial temporal lobe, and lateral temporal parietal junction. No evidence for hemorrhagic conversion. 2. Sequelae of small chronic hemorrhagic infarcts in the right cerebellum and central distribution chronic microhemorrhage likely hypertensive in etiology. 3. Advanced chronic microvascular ischemic changes and parenchymal volume loss.  Ct Head head  10/25/2016 IMPRESSION: Progressive low-density has developed in the right insular and frontal region and in the right temporal and parietal region consistent with completed infarction in those regions. Mild swelling but no hemorrhage or shift.   EEG 10/26/16 1.  diffuse cerebral dysfunction that is non-specific in etiology  and can be seen with hypoxic/ischemic injury, toxic/metabolic encephalopathies, neurodegenerative disorders, or medication effect.   2.  Structural or other physiologic abnormality in the right hemisphere, such as in setting of a stroke in that region.  TTE 10/26/16 Left ventricle: The cavity size was normal. There was mild focal   basal hypertrophy of the septum. Systolic function was normal.   The estimated ejection fraction was in the range of 55% to 60%.   Wall motion was normal; there were no regional wall motion   abnormalities.   PHYSICAL EXAM  Temp:  [98.5 F (36.9 C)-100.8 F (38.2 C)] 100 F (37.8 C) (11/20 1922) Pulse Rate:  [85-99] 94 (11/20 1926) Resp:  [22-29] 24 (11/20 1926) BP: (131-167)/(70-90) 161/72 (11/20 1922) SpO2:  [94 %-99 %] 97 % (11/20 1926) FiO2 (%):  [28 %] 28 % (11/20 1922) Weight:  [197 lb 1.5 oz (89.4 kg)] 197 lb 1.5 oz (89.4 kg) (11/20 0500)  General - Well nourished, well developed elderly asian male. s/p trachesotomy  On trach and PEG  Ophthalmologic - Fundi not visualized due to noncooperation.  Cardiovascular - irregularly irregular heart rate and rhythm.  Neuro - on trach and PEG, left eye open on voice, right eye redness at conjunctiva, swollen eyelid, not able to open right eye, not following comands. Pupil unequal sizes, left eye small with irregular shape and post surgical changes, left eye 4mm reactive but sluggish. Moves right UE spontaneously and strong, move right LE 3/5 on pain stimulatoin. Withdraws left leg to pain, but 0/5 LUE on pain. Sensation, coordination, and gait not tested.   ASSESSMENT/PLAN [name] is a 82 y.o. male with history of hypertension, hyperlipidemia, and atrial fibrillation on Pradaxa presenting with left hemiplegia and speech difficulties. He did not receive IV t-PA due to anticoagulation. Had mechanical thrombectomy.  Stroke:  Right MCA infarct due to right ICA occlusion s/p mechanical thrombectomy,  embolic secondary to atrial fibrillation noncompliance with Pradaxa.  Resultant  left  Hemiparesis.   MRI - acute infarction in the right MCA territory   CTA H&N - acute right MCA infarct. Occlusion of the right ICA.  Repeat CT 10/25/2016 - evolving right MCA infarct, no hemorrhage, no midline shift  2D Echo - EF 55-60%  LDL - 53  HgbA1c - 6.2  VTE prophylaxis - subcutaneous heparin Diet NPO time specified  Pradaxa (dabigatran) twice a day prior to admission, now on ASA. Recommend to restart pradaxa as no more procedure planned at this time.  Ongoing aggressive stroke risk factor management  Therapy recommendations: pending  Disposition:  pending  Right ICA occlusion  Likely due to A. fib noncompliance with Pradaxa  S/p mechanical thrombectomy  BP goal normotensive   Chronic A. fib  On Pradaxa at home however missing doses one day PTA  Hold off Pradaxa so  far due to large right MCA infarct and surgical interventions  resume pradaxa for stroke prevention  Respiratory failure 2/2 to stroke  s/p trach  Off vent  On trach collar, tolerating well  Hypertension  Stable  BP goal 130-150  avoid hyperperfusion syndrome Avoid hypotension  Hyperlipidemia  Home meds:  Niacin - not resumed.  LDL 53, goal < 70  dysphagia  NPO  Feeding supplement 45 cc / hr - started 10/24/2016  PEG placed.  Continue TF   Other Stroke Risk Factors  Advanced age  Other Active Problems  Hypokalemia - replete as needed  S/p bilateral cataract surgery  Anemia - stable around 10s  Thrombocytopenia -stable.  Hospital day # 10  Marvel Plan, MD PhD Stroke Neurology 11/02/2016 9:37 PM

## 2016-11-02 NOTE — Progress Notes (Signed)
Occupational Therapy Treatment Patient Details Name: Sean [name] MRN: [id number] DOB: Feb 16, 1926 Today's Date: 11/02/2016    History of present illness Sean [name] is a 82 y.o. male admitted with He has expressive aphasia and Not moving left side with right-sided gaze deviation. MRI: Areas of acute infarction in the right MCA territory involving the insula, frontal operculum, anteromedial temporal lobe, and lateral temporal parietal junction s/p revascularization.   OT comments  Pt making slow progress. Minimal participation. No family present today. Pt did wash his face once cloth brought to face. Initiated movement to mouth with toothbrush. Lethargic. Recommend rehab at SNF. Will continue to follow acutely.  Follow Up Recommendations  SNF;Supervision/Assistance - 24 hour    Equipment Recommendations  Other (comment) (TBA at SNF)    Recommendations for Other Services      Precautions / Restrictions Precautions Precautions: Fall Precaution Comments: trach, peg       Mobility Bed Mobility Overal bed mobility: +2 for physical assistance;Needs Assistance Bed Mobility: Supine to Sit;Sit to Supine     Supine to sit: Total assist;+2 for physical assistance Sit to supine: Total assist;+2 for physical assistance      Transfers                 General transfer comment: Not assessed. Needs sky/maximove to lift OOB    Balance Overall balance assessment: Needs assistance   Sitting balance-Leahy Scale: Poor Sitting balance - Comments: R bias                           ADL Overall ADL's : Needs assistance/impaired     Grooming: Moderate assistance Grooming Details (indicate cue type and reason): washed face when given cloth and therapist initiated activity by placing hand to face, the pt washed face and head. When given toothbrush, pt opened mouth and began movement to mouth but unable to copmlete motor pattern to put brush in mouth                             Functional mobility during ADLs: Total assistance;+2 for physical assistance General ADL Comments: attempted to have pt rub lotion on R leg. minimal response. Eyes closed 95% of session. When open, severe R lateral gaze. kept R eye closed throughout      Vision                 Additional Comments: Brief moment of R eye achieveing midline, otherwise, eyes closed and deviated to R when opened   Perception     Praxis      Cognition   Behavior During Therapy: Flat affect Overall Cognitive Status: Difficult to assess Area of Impairment: Following commands;Problem solving                     Extremity/Trunk Assessment               Exercises Other Exercises Other Exercises: LUE PROM.   Shoulder Instructions       General Comments      Pertinent Vitals/ Pain       Pain Assessment: Faces Faces Pain Scale: Hurts even more Pain Location: neck ROM Pain Descriptors / Indicators: Grimacing Pain Intervention(s): Limited activity within patient's tolerance;Repositioned  Home Living  Prior Functioning/Environment              Frequency  Min 2X/week        Progress Toward Goals  OT Goals(current goals can now be found in the care plan section)  Progress towards OT goals: Progressing toward goals  Acute Rehab OT Goals Patient Stated Goal: unable OT Goal Formulation: Patient unable to participate in goal setting Potential to Achieve Goals: Fair ADL Goals Pt Will Perform Grooming: with mod assist;sitting;bed level Additional ADL Goal #1: Pt will follow 2 out of 10 1-step commands Additional ADL Goal #2: Pt will be able to maintain EOB balance with Mod A at intervals during a 10 minute time period Additional ADL Goal #3: Pt will be able to track left of midline 1 out of 5 trials  Plan Discharge plan needs to be updated;Frequency needs to be updated    Co-evaluation     PT/OT/SLP Co-Evaluation/Treatment: Yes Reason for Co-Treatment: Complexity of the patient's impairments (multi-system involvement);For patient/therapist safety   OT goals addressed during session: ADL's and self-care;Strengthening/ROM      End of Session Equipment Utilized During Treatment: Oxygen   Activity Tolerance Patient limited by lethargy   Patient Left in bed;with call bell/phone within reach;with restraints reapplied;with bed alarm set   Nurse Communication Mobility status;Need for lift equipment        Time: 1140 [phone](1140)-1209 OT Time Calculation (min): 29 min  Charges: OT General Charges $OT Visit: 1 Procedure OT Treatments $Self Care/Home Management : 8-22 mins  Sean [name],HILLARY 11/02/2016, 12:19 PM   [facility] Tiron Suski, OTR/L  [phone] 11/02/2016

## 2016-11-02 NOTE — Care Management Note (Signed)
Case Management Note  Patient Details  Name: Sean [name] MRN: [id number] Date of Birth: March 05, 1926  Subjective/Objective:  Transfer from [place], patient with R MCA secondary to R ca occulusion, had a thrombectomy, was in afib.  Patient speaks mandarin, her two sons speaks [place], patient has trach and peg.  CSW following for SNF placement.                   Action/Plan:   Expected Discharge Date:                  Expected Discharge Plan:  Skilled Nursing Facility  In-House Referral:  Clinical Social Work  Discharge planning Services  CM Consult  Post Acute Care Choice:    Choice offered to:     DME Arranged:    DME Agency:     HH Arranged:    HH Agency:     Status of Service:  Completed, signed off  If discussed at [employer] Meetings, dates discussed:    Additional Comments:  Leone Havenaylor, Khilee Hendricksen Clinton, RN 11/02/2016, 4:21 PM

## 2016-11-02 NOTE — Progress Notes (Signed)
Rehab admissions - Noted OT now recommending SNF placement.  Patient is currently too functionally impaired to be able to tolerate and benefit from inpatient rehab program.  Likely will need SNF unless he makes a lot of progress very soon.  Call me for questions.  #[phone]

## 2016-11-02 NOTE — Clinical Social Work Note (Signed)
Clinical Social Work Assessment  Patient Details  Name: Sean [name] MRN: [id number] Date of Birth: 1926/07/22  Date of referral:  11/02/16               Reason for consult:  Facility Placement                Permission sought to share information with:  Facility Medical sales representativeContact Representative, Family Supports Permission granted to share information::  No  Name::      Darral Dash([name] Wroe (Son) [phone])  Agency::  Skilled Nursing Facilities  Relationship::     Contact Information:     Housing/Transportation Living arrangements for the past 2 months:  Single Family Home Source of Information:  Adult Children Patient Interpreter Needed:  Other (Comment Required) Patient and wife speak Congohinese-- Patient's son and primary contact speaks [place]  Criminal Activity/Legal Involvement Pertinent to Current Situation/Hospitalization:  No - Comment as needed Significant Relationships:  Adult Children, Spouse, Other Family Members Lives with:  Adult Children, Spouse Do you feel safe going back to the place where you live?  No Need for family participation in patient care:  Yes (Comment)  Care giving concerns:  Patient's son reports that prior to hospital admission, Patient was dependent on wife for most ADL's. Patient's wife unable to provide the increased care that Patient needs.    Social Worker assessment / plan:  Patientis a non-English speaking 82 y.o.malepresenting after having a stroke. Patient is presently unable to follow commands and is trach/PEG. CSW engaged with Patient's family Viviann Spare(Steven (son) Patient's wife) at Patient's bedside. CSW introduced self, role of CSW, and began discussion of discharge planning. Patient and family report that Patient and his wife [place] recently [place]. While living in [place], Patient and wife were living with Viviann SpareSteven, one of their sons, their son's wife, and Patient's son's small children. Patient's son reports that he  and his wife were unable to care for his parents so they moved him in with Patient's other son, [name], in [place], [place]. Patient's son reports that his brother, [name], is able to provide more stable care for his parents. Patient's son reports that Patientas dependent on wife for most ADL's and wasn't mobile. Patient's wife unable to provide the increased care that Patient needs.  Patient's son reports agreement to SNF facility placement at discharge. Patient's son requesting that Patient be located near Patient's son in [place] so that Patient's wife can visit daily. Patient's family denies any current concerns at this time. CSW to facilitate SNF placement. Patient's family requests to be kept updated. CSW has completed FL-2 and obtained PASRR. Awaiting bed offers.   Employment status:  Retired Health and safety inspectornsurance information:  Other (Comment Required) (Generic Medicare Advantage) PT Recommendations:  Skilled Nursing Facility Information / Referral to community resources:  Skilled Nursing Facility  Patient/Family's Response to care:  Patient and family appreciative of care received at this time.   Patient/Family's Understanding of and Emotional Response to Diagnosis, Current Treatment, and Prognosis:  Patient's son able to verbalize strong understanding of diagnosis, treatment, and prognosis. Patient's family agreeable to OT/PT recommendation of skilled nursing facility placement at discharge.   Emotional Assessment Appearance:  Appears stated age Attitude/Demeanor/Rapport:  Unable to Assess Affect (typically observed):  Unable to Assess Orientation:   (Unable to Assess) Alcohol / Substance use:  Not Applicable Psych involvement (Current and /or in the community):  No (Comment)  Discharge Needs  Concerns to be addressed:  Discharge Planning Concerns, Care  Coordination Readmission within the last 30 days:  No Current discharge risk:  None Barriers to Discharge:  No Barriers Identified   Rockwell Germanyshley N Gardner,  LCSW 11/02/2016, 2:28 PM

## 2016-11-02 NOTE — NC FL2 (Signed)
[place] MEDICAID FL2 LEVEL OF CARE SCREENING TOOL     IDENTIFICATION  Patient Name: Sean [name] Birthdate: 01-03-1926 Sex: male Admission Date (Current Location): 10/23/2016  [facility] and [place] Number:  Herbalist and Address:  [facility] [place]. [facility], [place] [address] [place], [place] [address]      Provider Number: [id number]  Attending Physician Name and Address:  Cherene Altes, MD  Relative Name and Phone Number:  Bellamy Judson ([phone])    Current Level of Care: Hospital Recommended Level of Care: [place] Prior Approval Number:    Date Approved/Denied:   PASRR Number: [id number]  Discharge Plan: SNF    Current Diagnoses: Patient Active Problem List   Diagnosis Date Noted  . Acute respiratory failure with hypoxia ([place])   . Cerebrovascular accident (CVA) due to embolism of right carotid artery ([place])   . Encounter for intubation   . Chronic a-fib ([place])   . Dysphagia   . Acute blood loss anemia   . Thrombocytopenia ([place])   . Benign essential HTN   . Bradycardia   . Pain   . Ventilator dependence ([place])   . Cerebral embolism with cerebral infarction 10/23/2016  . Cerebrovascular accident (CVA) due to thrombosis of precerebral artery ([place]) 10/23/2016    Orientation RESPIRATION BLADDER Height & Weight      (Unable to assess. )  Tracheostomy (trach shiley 43m cuffed; 6L/min 28% FiO2) Incontinent, External catheter Weight: 197 lb 1.5 oz (89.4 kg) Height:  5' 7"  (170.2 cm)  BEHAVIORAL SYMPTOMS/MOOD NEUROLOGICAL BOWEL NUTRITION STATUS      Incontinent Feeding tube (PEG; VITAL AF 1.2 CAL 1,000 mL; 760mhr continuous)  AMBULATORY STATUS COMMUNICATION OF NEEDS Skin   Total Care Does not communicate Surgical wounds (closed right groin incision )                       Personal Care Assistance Level of Assistance  Bathing, Feeding, Dressing, Total care Bathing Assistance: Maximum assistance Feeding assistance:  Maximum assistance Dressing Assistance: Maximum assistance Total Care Assistance: Maximum assistance   Functional Limitations Info  Sight, Hearing, Speech Sight Info: Impaired Hearing Info: Adequate Speech Info: Impaired    SPECIAL CARE FACTORS FREQUENCY  PT (By licensed PT), OT (By licensed OT)     PT Frequency: min 3x/week OT Frequency: min 3x/week             Contractures Contractures Info: Not present    Additional Factors Info  Code Status, Allergies, Insulin Sliding Scale, Suctioning Needs Code Status Info: FULL Allergies Info: No Known Allergies   Insulin Sliding Scale Info: Novolog 2-6 units every 4 hrs   Suctioning Needs: tracheal catheter; moderate secretions   Current Medications (11/02/2016):  This is the current hospital active medication list Current Facility-Administered Medications  Medication Dose Route Frequency Provider Last Rate Last Dose  . acetaminophen (TYLENOL) solution 650 mg  650 mg Oral Q6H PRN WeRush FarmerMD   650 mg at 11/01/16 1144  . albuterol (PROVENTIL) (2.5 MG/3ML) 0.083% nebulizer solution 2.5 mg  2.5 mg Nebulization Q4H PRN DaRaylene MiyamotoMD   2.5 mg at 11/01/16 1942  . aspirin tablet 325 mg  325 mg Oral Daily JiRosalin HawkingMD   325 mg at 11/02/16 094680. bisacodyl (DULCOLAX) suppository 10 mg  10 mg Rectal Daily PRN TiDarrel ReachMD   10 mg at 10/30/16 0508  . cefTRIAXone (ROCEPHIN) 1 g in dextrose  5 % 50 mL IVPB  1 g Intravenous Q24H Tasrif Ahmed, MD   1 g at 11/02/16 1317  . chlorhexidine gluconate (MEDLINE KIT) (PERIDEX) 0.12 % solution 15 mL  15 mL Mouth Rinse BID Rosalin Hawking, MD   15 mL at 11/02/16 0830  . feeding supplement (VITAL AF 1.2 CAL) liquid 1,000 mL  1,000 mL Per Tube Continuous Rogue Bussing, RD 70 mL/hr at 11/01/16 2100 1,000 mL at 11/01/16 2100  . fentaNYL (SUBLIMAZE) injection 100 mcg  100 mcg Intravenous Q2H PRN Omar Person, NP   100 mcg at 10/31/16 0418  . heparin injection 5,000 Units   5,000 Units Subcutaneous Q8H Rosalin Hawking, MD   5,000 Units at 11/02/16 1317  . hydrALAZINE (APRESOLINE) tablet 50 mg  50 mg Oral Q8H Rosalin Hawking, MD   50 mg at 11/02/16 1317  . insulin aspart (novoLOG) injection 2-6 Units  2-6 Units Subcutaneous Q4H Omar Person, NP   4 Units at 11/02/16 [phone]  . labetalol (NORMODYNE,TRANDATE) injection 10 mg  10 mg Intravenous Q2H PRN Javier Glazier, MD   10 mg at 10/30/16 2305  . MEDLINE mouth rinse  15 mL Mouth Rinse 10 times per day Rosalin Hawking, MD   15 mL at 11/02/16 1217  . pantoprazole (PROTONIX) EC tablet 40 mg  40 mg Oral Daily Rosalin Hawking, MD   40 mg at 11/02/16 6773     Discharge Medications: Please see discharge summary for a list of discharge medications.  Relevant Imaging Results:  Relevant Lab Results:   Additional Information SS # [id number]  Judeth Horn, LCSW

## 2016-11-02 NOTE — Progress Notes (Signed)
[place] TEAM 1 - [place]  VOP:[id number] DOB: 1926-01-19 DOA: 10/23/2016 PCP: No primary care provider on file.    Brief Narrative:  82 y/o M admitted 11/10 with c/o AMS, no movement on left side, unable to speak.  Found to have R ICA occlusion / MCA CVA.  To Neuro IR for thrombectomy, returned to ICU on vent.   Significant Events: 11/10 admit w/ acute CVA - to IR for recanalization of R ICA - intubated 11/16 tracheostomy and PEG placed  11/20 TRH assumed cre   Subjective: The patient is not able to communicate with me.  His eyes are closed.  He does not make any purposeful movement.  There is no family present.  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  Felt to be embolic in setting of afib w/ Pradaxa noncompliance - anticoag currently on hold given large CVA territory - OT/PT recommending CIR   Acute Respiratory Failure secondary to CVA Trach 11/16 - ongoing tracheostomy care per PCCM - on TC at time of exam today   Chronic Afib Noncompliant w/ Pradaxa - rate controlled at this time - resume anticoag at discretion of Neuro   HTN Reasonably well controlled at the present time - follow trend with ultimate goal to slowly decrease average reading over the next week  HLD LDL 53 - f/u as outpt  Hypokalemia Corrected  Dysphagia  PEG 11/16 - continue tube feeds   Thrombocytopenia unknown baseline, on chronic anticoagulation - platelet count climbing  Normocytic Anemia No gross evidence of blood loss - hemoglobin essentially stable  HCAP - ? Effusion Left base  Antibiotic therapy initiated by PCCM 11/17 - plan to stop after 5 days of treatment  Hyperglycemia  A1c 6.2 - consistent with prediabetic state- follow CBG  DVT prophylaxis: subcutaneous heparin Code Status: FULL CODE Family Communication: no family present at time of exam  Disposition Plan:  CIR  Consultants:  PCCM Neurology    Antimicrobials:  Ceftriaxone 11/17 >  Objective: Blood pressure (!) 152/81, pulse 99, temperature (!) 100.8 F (38.2 C), temperature source Axillary, resp. rate (!) 29, height 5' 7"  (1.702 m), weight 89.4 kg (197 lb 1.5 oz), SpO2 94 %.  Intake/Output Summary (Last 24 hours) at 11/02/16 1017 Last data filed at 11/02/16 0801  Gross per 24 hour  Intake           991.17 ml  Output             1035 ml  Net           -43.83 ml   Filed Weights   11/01/16 0355 11/01/16 2050 11/02/16 0500  Weight: 85.6 kg (188 lb 11.4 oz) 91.3 kg (201 lb 4.5 oz) 89.4 kg (197 lb 1.5 oz)    Examination: General: No acute respiratory distress Lungs: Coarse upper airway sounds transmitted throughout with no focal crackles or wheezing  Cardiovascular: irregularly irregular with rate controlled with no appreciable murmur  Abdomen: Nontender, nondistended, soft, bowel sounds positive, no rebound, no ascites, no appreciable mass Extremities: No significant cyanosis, clubbing, or edema bilateral lower extremities  CBC:  Recent Labs Lab 10/29/16 0943 10/30/16 0529 10/31/16 0256 11/01/16 0129 11/02/16 0310  WBC 9.1 6.7 8.1 10.6* 10.3  HGB 9.3* 9.6* 9.4* 10.4* 9.7*  HCT 28.2* 29.7* 29.5* 32.0* 30.7*  MCV 88.4 89.2 88.9 88.6 87.7  PLT 120* 155 165 195 638   Basic Metabolic Panel:  Recent  Labs Lab 10/27/16 0250 10/28/16 0326 10/30/16 0529 10/31/16 0256 11/01/16 0129 11/02/16 0310  NA 143 141 138 139 138 137  K 3.9 3.5 3.3* 3.6 3.6 3.8  CL 116* 104 103 105 104 104  CO2 22 28 26 25 26 24   GLUCOSE 180* 127* 166* 199* 168* 183*  BUN 15 12 20 18 15 17   CREATININE 0.62 0.60* 0.69 0.60* 0.61 0.57*  CALCIUM 8.0* 8.3* 8.0* 8.0* 8.5* 8.2*  MG 2.0 1.9 2.0  --   --   --   PHOS 2.9 3.9 3.5  --   --   --    GFR: Estimated Creatinine Clearance: 68.1 mL/min (by C-G formula based on SCr of 0.57 mg/dL (L)).  Liver Function Tests: No results for input(s): AST, ALT, ALKPHOS, BILITOT, PROT, ALBUMIN in the  last 168 hours. No results for input(s): LIPASE, AMYLASE in the last 168 hours. No results for input(s): AMMONIA in the last 168 hours.  Coagulation Profile:  Recent Labs Lab 10/28/16 1119  INR 1.13   HbA1C: Hgb A1c MFr Bld  Date/Time Value Ref Range Status  10/24/2016 05:00 AM 6.2 (H) 4.8 - 5.6 % Final    Comment:    (NOTE)         Pre-diabetes: 5.7 - 6.4         Diabetes: >6.4         Glycemic control for adults with diabetes: <7.0     CBG:  Recent Labs Lab 11/01/16 1540 11/01/16 2014 11/01/16 2259 11/02/16 0254 11/02/16 0756  GLUCAP 184* 168* 186* 177* 161*    Recent Results (from the past 240 hour(s))  MRSA PCR Screening     Status: None   Collection Time: 10/23/16  2:33 PM  Result Value Ref Range Status   MRSA by PCR NEGATIVE NEGATIVE Final    Comment:        The GeneXpert MRSA Assay (FDA approved for NASAL specimens only), is one component of a comprehensive MRSA colonization surveillance program. It is not intended to diagnose MRSA infection nor to guide or monitor treatment for MRSA infections.   Culture, blood (routine x 2)     Status: None (Preliminary result)   Collection Time: 10/29/16 12:20 PM  Result Value Ref Range Status   Specimen Description BLOOD RIGHT HAND  Final   Special Requests BOTTLES DRAWN AEROBIC ONLY 5CC  Final   Culture NO GROWTH 3 DAYS  Final   Report Status PENDING  Incomplete  Culture, blood (routine x 2)     Status: None (Preliminary result)   Collection Time: 10/29/16 12:30 PM  Result Value Ref Range Status   Specimen Description BLOOD LEFT HAND  Final   Special Requests BOTTLES DRAWN AEROBIC ONLY 5CC  Final   Culture NO GROWTH 3 DAYS  Final   Report Status PENDING  Incomplete  Culture, Urine     Status: None   Collection Time: 10/29/16  1:30 PM  Result Value Ref Range Status   Specimen Description URINE, CLEAN CATCH  Final   Special Requests NONE  Final   Culture NO GROWTH  Final   Report Status 10/30/2016 FINAL   Final  Culture, respiratory (NON-Expectorated)     Status: None   Collection Time: 10/30/16  4:09 PM  Result Value Ref Range Status   Specimen Description TRACHEAL ASPIRATE  Final   Special Requests NONE  Final   Gram Stain   Final    MODERATE WBC PRESENT, PREDOMINANTLY PMN MODERATE GRAM  POSITIVE RODS FEW GRAM POSITIVE COCCI IN PAIRS    Culture Consistent with normal respiratory flora.  Final   Report Status 11/01/2016 FINAL  Final  Culture, respiratory (NON-Expectorated)     Status: None (Preliminary result)   Collection Time: 11/01/16  1:52 PM  Result Value Ref Range Status   Specimen Description TRACHEAL ASPIRATE  Final   Special Requests NONE  Final   Gram Stain   Final    MODERATE WBC PRESENT,BOTH PMN AND MONONUCLEAR RARE GRAM POSITIVE COCCI IN PAIRS RARE GRAM NEGATIVE RODS    Culture PENDING  Incomplete   Report Status PENDING  Incomplete     Scheduled Meds: . aspirin  325 mg Oral Daily  . cefTRIAXone (ROCEPHIN)  IV  1 g Intravenous Q24H  . chlorhexidine gluconate (MEDLINE KIT)  15 mL Mouth Rinse BID  . heparin subcutaneous  5,000 Units Subcutaneous Q8H  . hydrALAZINE  50 mg Oral Q8H  . insulin aspart  2-6 Units Subcutaneous Q4H  . mouth rinse  15 mL Mouth Rinse 10 times per day  . pantoprazole  40 mg Oral Daily   Continuous Infusions: . feeding supplement (VITAL AF 1.2 CAL) 1,000 mL (11/01/16 2100)     LOS: 10 days   Cherene Altes, MD [facility] Office  [phone] Pager - Text Page per Amion as per below:  On-Call/Text Page:      [email or web address]      password [email or web address]  If 7PM-7AM, please contact night-coverage [email or web address] Password [email or web address] 11/02/2016, 10:17 AM

## 2016-11-02 NOTE — Progress Notes (Signed)
Physical Therapy Treatment Patient Details Name: Sean [name] MRN: [id number] DOB: [birth year] Today's Date: 11/02/2016    History of Present Illness Sean [name] is a 82 y.o. male admitted with He has expressive aphasia and Not moving left side with right-sided gaze deviation. MRI: Areas of acute infarction in the right MCA territory involving the insula, frontal operculum, anteromedial temporal lobe, and lateral temporal parietal junction s/p revascularization.    PT Comments    Pt with minimal participation throughout session.  Today pt did A with maintaining balance in sitting, but does continue to need extensive A for all aspects of mobility.  Pt with strong R sided gaze and R cervical rotation.  Feel at this time pt would be most appropriate for SNF level of care and therapies at D/C.  Will continue to follow.    Follow Up Recommendations  SNF     Equipment Recommendations  None recommended by PT    Recommendations for Other Services       Precautions / Restrictions Precautions Precautions: Fall Precaution Comments: trach, peg Restrictions Weight Bearing Restrictions: No    Mobility  Bed Mobility Overal bed mobility: Needs Assistance;+2 for physical assistance Bed Mobility: Supine to Sit;Sit to Supine     Supine to sit: Total assist;+2 for physical assistance Sit to supine: Total assist;+2 for physical assistance   General bed mobility comments: pt does not A with bed mobility.    Transfers                 General transfer comment: Not assessed. Needs sky/maximove to lift OOB  Ambulation/Gait                 Stairs            Wheelchair Mobility    Modified Rankin (Stroke Patients Only)       Balance Overall balance assessment: Needs assistance Sitting-balance support: No upper extremity supported;Feet supported Sitting balance-Leahy Scale: Poor Sitting balance - Comments: R sided lean, but does exhibit righting reaction when allowed to  lean too far to R and posteriorly.                              Cognition Arousal/Alertness: Lethargic Behavior During Therapy: Flat affect Overall Cognitive Status: Difficult to assess Area of Impairment: Following commands;Problem solving                    Exercises Other Exercises Other Exercises: LUE PROM.    General Comments        Pertinent Vitals/Pain Pain Assessment: Faces Faces Pain Scale: Hurts even more Pain Location: Neck ROM Pain Descriptors / Indicators: Grimacing Pain Intervention(s): Limited activity within patient's tolerance;Monitored during session;Repositioned    Home Living                      Prior Function            PT Goals (current goals can now be found in the care plan section) Acute Rehab PT Goals Patient Stated Goal: unable PT Goal Formulation: Patient unable to participate in goal setting Time For Goal Achievement: 11/09/16 Potential to Achieve Goals: Fair Progress towards PT goals: Progressing toward goals    Frequency    Min 3X/week      PT Plan Discharge plan needs to be updated    Co-evaluation PT/OT/SLP Co-Evaluation/Treatment: Yes Reason for Co-Treatment: Complexity of the patient's impairments (multi-system involvement);For  patient/therapist safety PT goals addressed during session: Mobility/safety with mobility;Balance OT goals addressed during session: ADL's and self-care;Strengthening/ROM     End of Session Equipment Utilized During Treatment:  (Trach collar) Activity Tolerance: Patient limited by lethargy Patient left: in bed;with call bell/phone within reach;with bed alarm set;with nursing/sitter in room     Time: 1142-1209 PT Time Calculation (min) (ACUTE ONLY): 27 min  Charges:  $Therapeutic Activity: 8-22 mins                    G CodesSunny [name]:      Sean [name] F Sean [name], [place]PT  [phone] 11/02/2016, 2:15 PM

## 2016-11-03 ENCOUNTER — Encounter (HOSPITAL_COMMUNITY): Payer: Self-pay | Admitting: General Practice

## 2016-11-03 DIAGNOSIS — I63131 Cerebral infarction due to embolism of right carotid artery: Secondary | ICD-10-CM

## 2016-11-03 DIAGNOSIS — I10 Essential (primary) hypertension: Secondary | ICD-10-CM

## 2016-11-03 DIAGNOSIS — R1313 Dysphagia, pharyngeal phase: Secondary | ICD-10-CM

## 2016-11-03 DIAGNOSIS — J189 Pneumonia, unspecified organism: Secondary | ICD-10-CM

## 2016-11-03 LAB — GLUCOSE, CAPILLARY
GLUCOSE-CAPILLARY: 176 mg/dL — AB (ref 65–99)
GLUCOSE-CAPILLARY: 170 mg/dL — AB (ref 65–99)
Glucose-Capillary: 159 mg/dL — ABNORMAL HIGH (ref 65–99)
Glucose-Capillary: 175 mg/dL — ABNORMAL HIGH (ref 65–99)
Glucose-Capillary: 180 mg/dL — ABNORMAL HIGH (ref 65–99)
Glucose-Capillary: 141 mg/dL — ABNORMAL HIGH (ref 65–99)

## 2016-11-03 LAB — CULTURE, RESPIRATORY W GRAM STAIN: Culture: NORMAL

## 2016-11-03 LAB — CULTURE, BLOOD (ROUTINE X 2)
CULTURE: NO GROWTH
Culture: NO GROWTH

## 2016-11-03 MED ORDER — APIXABAN 5 MG PO TABS
5.0000 mg | ORAL_TABLET | Freq: Two times a day (BID) | ORAL | Status: DC
  Administered 2016-11-03 – 2016-11-05 (×6): 5 mg via ORAL
  Filled 2016-11-03 (×6): qty 1

## 2016-11-03 MED ORDER — BACITRACIN-POLYMYXIN B 500-10000 UNIT/GM OP OINT
TOPICAL_OINTMENT | Freq: Every day | OPHTHALMIC | Status: DC
  Administered 2016-11-03 – 2016-11-06 (×4): via OPHTHALMIC
  Administered 2016-11-07: 1 via OPHTHALMIC
  Administered 2016-11-08 – 2016-11-10 (×3): via OPHTHALMIC
  Administered 2016-11-11: 1 via OPHTHALMIC
  Administered 2016-11-12 – 2016-11-13 (×2): via OPHTHALMIC
  Administered 2016-11-14: 1 via OPHTHALMIC
  Administered 2016-11-15 – 2016-11-19 (×5): via OPHTHALMIC
  Filled 2016-11-03 (×4): qty 3.5

## 2016-11-03 MED ORDER — ALBUTEROL SULFATE (2.5 MG/3ML) 0.083% IN NEBU
2.5000 mg | INHALATION_SOLUTION | Freq: Four times a day (QID) | RESPIRATORY_TRACT | Status: DC
  Administered 2016-11-04 – 2016-11-06 (×10): 2.5 mg via RESPIRATORY_TRACT
  Filled 2016-11-03 (×10): qty 3

## 2016-11-03 MED ORDER — CHLORHEXIDINE GLUCONATE 0.12 % MT SOLN
15.0000 mL | Freq: Two times a day (BID) | OROMUCOSAL | Status: DC
  Administered 2016-11-03 – 2016-11-20 (×34): 15 mL via OROMUCOSAL
  Filled 2016-11-03 (×24): qty 15

## 2016-11-03 MED ORDER — CIPROFLOXACIN HCL 0.3 % OP SOLN: 2.0000 [drp] | Freq: Four times a day (QID) | OPHTHALMIC | Status: DC

## 2016-11-03 MED ORDER — CIPROFLOXACIN HCL 0.3 % OP SOLN
2.0000 [drp] | Freq: Three times a day (TID) | OPHTHALMIC | Status: DC
  Administered 2016-11-03 – 2016-11-20 (×49): 2 [drp] via OPHTHALMIC
  Filled 2016-11-03 (×3): qty 2.5

## 2016-11-03 MED ORDER — FUROSEMIDE 10 MG/ML IJ SOLN
40.0000 mg | Freq: Two times a day (BID) | INTRAMUSCULAR | Status: AC
  Administered 2016-11-03 – 2016-11-05 (×4): 40 mg via INTRAVENOUS
  Filled 2016-11-03 (×4): qty 4

## 2016-11-03 MED ORDER — ACETYLCYSTEINE 20 % IN SOLN
4.0000 mL | Freq: Four times a day (QID) | RESPIRATORY_TRACT | Status: DC
Start: 2016-11-03 — End: 2016-11-06
  Administered 2016-11-03 – 2016-11-06 (×10): 4 mL via RESPIRATORY_TRACT
  Filled 2016-11-03 (×10): qty 4

## 2016-11-03 NOTE — Progress Notes (Signed)
PULMONARY / CRITICAL CARE MEDICINE   Name: Sean [name] MRN: [id number] DOB: 1926-03-27    ADMISSION DATE:  10/23/2016 CONSULTATION DATE:  10/23/16  REFERRING MD: Dr. Ranae PalmsYelverton   CHIEF COMPLAINT: Acute onset of left hemiplegia and aphasia  BRIEF SUMMARY: 82 y/o M admitted 11/10 with c/o AMS, no movement on left side, unable to speak.  Found to have R ICA occlusion / MCA CVA.  To Neuro IR for thrombectomy, returned to ICU on vent.    SUBJECTIVE:   Doing well on trach collar with adequate sats-28%   VITAL SIGNS: BP 119/60 (BP Location: Left Arm)   Pulse 83   Temp 98.8 F (37.1 C) (Axillary)   Resp 18   Ht 5\' 7"  (1.702 m)   Wt 196 lb 6.9 oz (89.1 kg)   SpO2 100%   BMI 30.77 kg/m   HEMODYNAMICS:    VENTILATOR SETTINGS: FiO2 (%):  [28 %] 28 %  INTAKE / OUTPUT: I/O last 3 completed shifts: In: 2269.7 [Other:30; NG/GT:2139.7; IV Piggyback:100] Out: 2010 [Urine:2010]  PHYSICAL EXAMINATION: General: Elderly male in bed on TC, opens eyes .Does not speak english Neuro: does not open eyes to command but does to pain and does withdraw right hand to pain.  Gag intact. decreased movement left arm HEENT: mm pink/moist Neck : Trach midline , copious tan secretions  Cardiovascular: s1/s2 regular, a fib, trace edema Lungs: Breath sounds coarse, rhonchi  bilaterally, copious thick tan secretions Abdomen: Non tender, non distended, active bowel sounds  Musculoskeletal: no acute deformities.  Skin: Warm, dry, intact  LABS:  BMET  Recent Labs Lab 10/31/16 0256 11/01/16 0129 11/02/16 0310  NA 139 138 137  K 3.6 3.6 3.8  CL 105 104 104  CO2 25 26 24   BUN 18 15 17   CREATININE 0.60* 0.61 0.57*  GLUCOSE 199* 168* 183*    Electrolytes  Recent Labs Lab 10/28/16 0326 10/30/16 0529 10/31/16 0256 11/01/16 0129 11/02/16 0310  CALCIUM 8.3* 8.0* 8.0* 8.5* 8.2*  MG 1.9 2.0  --   --   --   PHOS 3.9 3.5  --   --   --     CBC  Recent Labs Lab 10/31/16 0256 11/01/16 0129  11/02/16 0310  WBC 8.1 10.6* 10.3  HGB 9.4* 10.4* 9.7*  HCT 29.5* 32.0* 30.7*  PLT 165 195 215    Coag's  Recent Labs Lab 10/28/16 1119  APTT 33  INR 1.13    Sepsis Markers No results for input(s): LATICACIDVEN, PROCALCITON, O2SATVEN in the last 168 hours.  ABG  Recent Labs Lab 10/28/16 0425  PHART 7.490*  PCO2ART 33.7  PO2ART 75.5*   Liver Enzymes No results for input(s): AST, ALT, ALKPHOS, BILITOT, ALBUMIN in the last 168 hours. Cardiac Enzymes No results for input(s): TROPONINI, PROBNP in the last 168 hours.  Glucose  Recent Labs Lab 11/02/16 1603 11/02/16 1921 11/02/16 2346 11/03/16 0348 11/03/16 0759 11/03/16 1238  GLUCAP 171* 190* 182* 159* 175* 180*   Imaging No results found. STUDIES:  -CTA Head and Neck 11/10 > Occlusion of the right internal carotid artery just distal to the carotid bifurcation. Severely diminished opacification of the right middle cerebral artery.  -CT Head 11/10 >Mild patchy areas of increased density in the right cerebellum,right hippocampus, and posterior limb internal capsule the right  MRI 11/10 >> areas of acute infarct in the R MCA territory, no hemorrhagic conversion, likely hypertensive chronic micro hemorrhage in the right cerebellum  -ECHO 11/11 >> ef 55%  CULTURES: Sputum 11/01/16  ANTIBIOTICS: Rocephin 11/17>> Normal Flora  SIGNIFICANT EVENTS: 11/10  Admit, IR for  Cerebral angiogram s/p middle cerebral artery embolism. 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 Taken to IR for recanalization of right ICA. Minimal neuro improvement, with transfer to SDU 11/19, where CCM is now managing trach care and management with [facility] managing medical care.   LINES/TUBES: ET tube 11/10 >>11/16 Left Radial Aline 11/10 >>out R Femoral Sheath 11/10 >> 11/11 Trach 11/16>> PEG 11/16>> DISCUSSION: 82 year old male  admitted 11/10 with R ICA stroke s/p mechanical thrombectomy with evolving large R MCA infarct and acute respiratory failure.  Mental status has not allowed extubation.  Trach and PEG 11/16. Currently weaning at 28%TC, tolerating well.    ASSESSMENT / PLAN:  PULMONARY A: Acute Respiratory Failure secondary to CVA  HCAP  P:   Trach 11/16, as mental status did not allow extubation. TC as tolerated, goal to 28% x 24 hours for SNF placement CXR prn Thick tan secretions  Mobilize/PT/OT  Cont on IV abx /follow cx -may need to broaden with Vanc   INFECTIOUS A: Fever without Leukocytosis 11/17 ? Effusion Left base 11/16? PNA  UA with + Nitrates, Few bacteria P:   - Per [facility] - Cont Rocphen  11/17 - Sputum Culture 11/19>> normal flora - Condom Cath/ Foley d/c  NEUROLOGIC A:   Right MCA/ACA s/p middle cerebral artery embolism  P:   - Per Neurology  - Repeat CT 11/12 noted - Frequent Neuro Exams  - PRN Fentanyl    FAMILY  - Updates:  wife  at beside 11/03/2016 and updated  - Inter-disciplinary family meet or Palliative Care meeting due by:  10/30/2016  Global : Care managed by [facility] since 11/20. PCCM continued consult for trach care/ management. Awaiting SNF placement when ready. Has been on 28% TC x 24 hours, but waiting for effective anticoag through PEG.Case Management involved  Bevelyn NgoSarah F. Groce, AGACNP-BC [facility] Pager [phone]   11/03/2016 12:46 PM   STAFF NOTE: Cindi CarbonI, Leam Madero, MD FACP have personally reviewed patient's available data, including medical history, events of note, physical examination and test results as part of my evaluation. I have discussed with resident/NP and other care providers such as pharmacist, RN and RRT. In addition, I personally evaluated patient and elicited key findings of:  Ronchi, secretions elevated but thin, mucomyst ordered, limit to 2 doses, he currently can still mobilize secretions, would add  lasix,  Maintain trach collar, NO downsize with current secretion status, pallaition??  Mcarthur RossettiDaniel J. Tyson AliasFeinstein, MD, FACP Pgr: [phone]  Pulmonary & Critical Care 11/03/2016 2:31 PM

## 2016-11-03 NOTE — Discharge Instructions (Signed)

## 2016-11-03 NOTE — Progress Notes (Addendum)
STROKE TEAM PROGRESS NOTE   SUBJECTIVE (INTERVAL HISTORY) No family at bedside. No neuro changes. Still has copious secretions from tach. Resumed pradaxa. Right eyelid swollen with secretions, put on antibiotic eye drops.   OBJECTIVE Temp:  [98.6 F (37 C)-100 F (37.8 C)] 99.4 F (37.4 C) (11/21 0801) Pulse Rate:  [79-99] 79 (11/21 0801) Cardiac Rhythm: Atrial fibrillation (11/21 0710) Resp:  [21-27] 23 (11/21 0801) BP: (131-161)/(64-90) 132/64 (11/21 0801) SpO2:  [95 %-99 %] 95 % (11/21 0801) FiO2 (%):  [28 %] 28 % (11/21 0801) Weight:  [196 lb 6.9 oz (89.1 kg)] 196 lb 6.9 oz (89.1 kg) (11/21 0500)  CBC:   Recent Labs Lab 11/01/16 0129 11/02/16 0310  WBC 10.6* 10.3  HGB 10.4* 9.7*  HCT 32.0* 30.7*  MCV 88.6 87.7  PLT 195 215    Basic Metabolic Panel:   Recent Labs Lab 10/28/16 0326 10/30/16 0529  11/01/16 0129 11/02/16 0310  NA 141 138  < > 138 137  K 3.5 3.3*  < > 3.6 3.8  CL 104 103  < > 104 104  CO2 28 26  < > 26 24  GLUCOSE 127* 166*  < > 168* 183*  BUN 12 20  < > 15 17  CREATININE 0.60* 0.69  < > 0.61 0.57*  CALCIUM 8.3* 8.0*  < > 8.5* 8.2*  MG 1.9 2.0  --   --   --   PHOS 3.9 3.5  --   --   --   < > = values in this interval not displayed.  Lipid Panel:     Component Value Date/Time   CHOL 104 10/24/2016 0719   TRIG 111 10/24/2016 0719   HDL 29 (L) 10/24/2016 0719   CHOLHDL 3.6 10/24/2016 0719   VLDL 22 10/24/2016 0719   LDLCALC 53 10/24/2016 0719   HgbA1c:  Lab Results  Component Value Date   HGBA1C 6.2 (H) 10/24/2016   Urine Drug Screen:     Component Value Date/Time   LABOPIA NONE DETECTED 10/23/2016 0628   COCAINSCRNUR NONE DETECTED 10/23/2016 0628   LABBENZ NONE DETECTED 10/23/2016 0628   AMPHETMU NONE DETECTED 10/23/2016 0628   THCU NONE DETECTED 10/23/2016 0628   LABBARB NONE DETECTED 10/23/2016 0628      IMAGING I have personally reviewed the radiological images below and agree with the radiology interpretations.  Ct  Angio Head and Neck W Or Wo Contrast 10/23/2016 1. Occlusion of the right internal carotid artery just distal to the carotid bifurcation  2. Severely diminished opacification of the right middle cerebral artery, in keeping with acute right MCA infarct and the reported clinical left-sided symptoms.  3. No hemodynamically significant stenosis of the left carotid or vertebral arteries.   Ct Cerebral Perfusion W Contrast 10/23/2016 Motion during the perfusion scan compromises the obtained data. The calculated mismatch volume of 193 mL is likely unreliable and is not expected to provide accurate assessment of the ischemic penumbra.   Ct Head Code Stroke W/o Cm 10/23/2016 1. No acute intracranial hemorrhage.  2. ASPECTS is 10.  No CT evidence of acute cortical infarct.   Cerebral Angiogram S/P Rt common carotid ,lt common caroptid anr RT vert artery angiograms,follwed by complete revascularization of occluded RT ICA,RT MCA and RT ACA with x 4 passes with solitaire 4mm x 40 mm retrieval device ,x 1pass with the 064 ACE aspiration catheter and 7.2 mg of INTEGRELIN   with a TICI 2B reperfusion.  MRI Head Wo Contrast 10/23/2016  1. Areas of acute infarction in the right MCA territory involving the insula, frontal operculum, anteromedial temporal lobe, and lateral temporal parietal junction. No evidence for hemorrhagic conversion. 2. Sequelae of small chronic hemorrhagic infarcts in the right cerebellum and central distribution chronic microhemorrhage likely hypertensive in etiology. 3. Advanced chronic microvascular ischemic changes and parenchymal volume loss.  Ct Head head  10/25/2016 IMPRESSION: Progressive low-density has developed in the right insular and frontal region and in the right temporal and parietal region consistent with completed infarction in those regions. Mild swelling but no hemorrhage or shift.   EEG 10/26/16 1.  diffuse cerebral dysfunction that is non-specific in etiology  and can be seen with hypoxic/ischemic injury, toxic/metabolic encephalopathies, neurodegenerative disorders, or medication effect.   2.  Structural or other physiologic abnormality in the right hemisphere, such as in setting of a stroke in that region.  TTE 10/26/16 Left ventricle: The cavity size was normal. There was mild focal   basal hypertrophy of the septum. Systolic function was normal.   The estimated ejection fraction was in the range of 55% to 60%.   Wall motion was normal; there were no regional wall motion   abnormalities.   PHYSICAL EXAM  Temp:  [98.6 F (37 C)-100 F (37.8 C)] 99.4 F (37.4 C) (11/21 0801) Pulse Rate:  [79-99] 79 (11/21 0801) Resp:  [21-27] 23 (11/21 0801) BP: (131-161)/(64-90) 132/64 (11/21 0801) SpO2:  [95 %-99 %] 95 % (11/21 0801) FiO2 (%):  [28 %] 28 % (11/21 0801) Weight:  [196 lb 6.9 oz (89.1 kg)] 196 lb 6.9 oz (89.1 kg) (11/21 0500)  General - Well nourished, well developed elderly asian male. s/p trachesotomy  On trach and PEG  Ophthalmologic - Fundi not visualized due to noncooperation.  Cardiovascular - irregularly irregular heart rate and rhythm.  Neuro - on trach and PEG, left eye open on voice, right eye redness with swollen eyelid and yellow secretions, not able to open right eye, not following comands. Pupil unequal sizes, left eye small with irregular shape and post surgical changes, left eye 4mm reactive but sluggish. Moves right UE spontaneously and strong, move right LE 3/5 on pain stimulatoin. Withdraws left leg to pain, but 0/5 LUE on pain. Sensation, coordination, and gait not tested.   ASSESSMENT/PLAN [name] is a 82 y.o. male with history of hypertension, hyperlipidemia, and atrial fibrillation on Pradaxa presenting with left hemiplegia and speech difficulties. He did not receive IV t-PA due to anticoagulation. Had mechanical thrombectomy.  Stroke:  Right MCA infarct due to right ICA occlusion s/p mechanical  thrombectomy, embolic secondary to atrial fibrillation noncompliance with Pradaxa.  Resultant  left  Hemiparesis.   MRI - acute infarction in the right MCA territory   CTA H&N - acute right MCA infarct. Occlusion of the right ICA.  Repeat CT 10/25/2016 - evolving right MCA infarct, no hemorrhage, no midline shift  2D Echo - EF 55-60%  LDL - 53  HgbA1c - 6.2  VTE prophylaxis - subcutaneous heparin Diet NPO time specified  Pradaxa (dabigatran) twice a day prior to admission, would like to resume pradaxa, however, it is capsule not suitable for PEG, will change to eliquis 5mg  bid.  Ongoing aggressive stroke risk factor management  Therapy recommendations: pending  Disposition:  pending  Right ICA occlusion  Likely due to A. fib noncompliance with Pradaxa  S/p mechanical thrombectomy  BP goal normotensive   Chronic A. fib  On Pradaxa at home however missing doses one day  PTA  Hold off Pradaxa so far due to large right MCA infarct and surgical interventions  Not able to resume pradaxa due to capsule form not suitable for PEG, change to eliquis 5mg  bid for stroke prevention  Respiratory failure 2/2 to stroke  s/p trach  Off vent  On trach collar, tolerating well  Hypertension  Stable  BP goal 130-150  avoid hyperperfusion syndrome Avoid hypotension  Hyperlipidemia  Home meds:  Niacin - not resumed.  LDL 53, goal < 70  dysphagia  NPO  Feeding supplement   PEG placed.  Continue TF   Other Stroke Risk Factors  Advanced age  Other Active Problems  Hypokalemia - replete as needed  S/p bilateral cataract surgery  Anemia - stable around 10s  Thrombocytopenia -stable  Right eye conjunctivitis - antibiotic eye drops ordered  Hospital day # 11  Neurology will sign off. Please call with questions. Pt will follow up with Dr. Roda Shutters at [facility] in about 6 weeks. Thanks for the consult.   Marvel Plan, MD PhD Stroke Neurology 11/03/2016 10:33  AM

## 2016-11-03 NOTE — Care Management Important Message (Signed)
Important Message  Patient Details  Name: Sean [name] MRN: [id number] Date of Birth: [birth year]   Medicare Important Message Given:  Yes    Babacar Haycraft Abena 11/03/2016, 10:09 AM

## 2016-11-03 NOTE — Progress Notes (Signed)
PROGRESS NOTE    Sean [name]  DEY:[id number] DOB: [birth year] DOA: 10/23/2016 PCP: No primary care provider on file.   Brief Narrative:   82 y.o.  MANDARIN SPEAKING AM PMHx HTN, Atrial Fibrillation on Pradaxa (noncompliance with medication),  Presenting with left hemiplegia and speech difficulties. He did not receive IV t-PA due to anticoagulation. Had mechanical thrombectomy   Subjective: 11/21  A/O 0, withdraws to painful stimuli. Tmax last 24 hours 38.2C   Assessment & Plan:   Active Problems:   Cerebral embolism with cerebral infarction   Cerebrovascular accident (CVA) due to thrombosis of precerebral artery (HCC)   Acute respiratory failure with hypoxia (HCC)   Cerebrovascular accident (CVA) due to embolism of right carotid artery ([place])   Encounter for intubation   Chronic a-fib (HCC)   Dysphagia   Acute blood loss anemia   Thrombocytopenia (HCC)   Benign essential HTN   Bradycardia   Pain   Ventilator dependence ([place])   HCAP (healthcare-associated pneumonia)   Right MCA CVA due to R ICA collusion s/p mechanical thrombectomy  -Felt to be embolic in setting of afib w/ Pradaxa noncompliance - anticoag currently on hold given large CVA territory  - Patient for SNF when cleared by pulmonology. Neurology has artery cleared for SNF  -Scheduled follow up with Dr. Rosalin Hawking at [facility] in about 6 weeks  Acute Respiratory Failure secondary to CVA -Trach 11/16  - ongoing tracheostomy care per PCCM - on TC at time of exam today   VAP/HCAP/Pulmonary Edema -Antibiotic therapy initiated by [facility] 11/17 - plan to stop after 5 days of treatment -PCXR from 11/20 shows worsening pulmonary edema/pneumonia. -Tracheal aspirate normal respiratory flora continue current antibiotic therapy per [facility]. -Mucomyst QID + Tracheal suctioning  Chronic Afib -Noncompliant w/ Pradaxa - rate controlled at this time  -Per Neurology Start Eliquis 5 mg BID  HTN -Reasonably well controlled at  the present time - follow trend with ultimate goal to slowly decrease average reading over the next week  HLD -LDL 53  -Per neurology niacin not resumed  Hypokalemia Corrected  Dysphagia  -PEG 11/16  - continue tube feeds  Thrombocytopenia -unknown baseline, on chronic anticoagulation  -Baseline  Normocytic Anemia -No gross evidence of blood loss  Recent Labs Lab 10/29/16 0943 10/30/16 0529 10/31/16 0256 11/01/16 0129 11/02/16 0310  HGB 9.3* 9.6* 9.4* 10.4* 9.7*  -stable  Prediabetes  -A1c 6.2 - consistent with prediabetic state -Continue ICU hyperglycemia protocol    DVT prophylaxis: Eliquis Code Status: Full Family Communication: Wife present Disposition Plan: Vent SNF   Consultants:  PCCM Neurology    Procedures/Significant Events:  11/10 admit w/ acute CVA - to IR for recanalization of R ICA - intubated 11/16 tracheostomy and PEG placed  11/20 TRH assumed cre    VENTILATOR SETTINGS:    Cultures   Antimicrobials: Ceftriaxone 11/17 >> Ciprofloxacin eyedrops 11/21>>   Devices    LINES / TUBES:      Continuous Infusions: . feeding supplement (VITAL AF 1.2 CAL) 1,000 mL (11/02/16 2205)     Objective: Vitals:   11/03/16 0500 11/03/16 0801 11/03/16 1148 11/03/16 1239  BP:  132/64  119/60  Pulse:  79 88 83  Resp:  (!) 23 (!) 22 18  Temp:  99.4 F (37.4 C)  98.8 F (37.1 C)  TempSrc:  Axillary  Axillary  SpO2:  95% 99% 100%  Weight: 89.1 kg (196 lb 6.9 oz)     Height:  Intake/Output Summary (Last 24 hours) at 11/03/16 1328 Last data filed at 11/03/16 0820  Gross per 24 hour  Intake            934.5 ml  Output             1650 ml  Net           -715.5 ml   Filed Weights   11/01/16 2050 11/02/16 0500 11/03/16 0500  Weight: 91.3 kg (201 lb 4.5 oz) 89.4 kg (197 lb 1.5 oz) 89.1 kg (196 lb 6.9 oz)    Examination:  General:  A/O 0,No acute respiratory distress Eyes: negative scleral hemorrhage, positive  anisocoria, negative icterus ENT: Negative Runny nose, negative gingival bleeding, Neck:  Negative scars, masses, torticollis, lymphadenopathy, JVD Lungs: diffuse rhonchi, sick purulent sputum from trach tube  Cardiovascular: Irregular irregular rhythm and rate, without murmur gallop or rub normal S1 and S2 Abdomen: negative abdominal pain, nondistended, positive soft, bowel sounds, no rebound, no ascites, no appreciable mass Extremities: No significant cyanosis, clubbing, or edema bilateral lower extremities Skin: Negative rashes, lesions, ulcers Psychiatric:  Unable to evaluate  Central nervous system:  Unable to evaluate other than patient withdraws to painful stimuli, positive anisocoria .     Data Reviewed: Care during the described time interval was provided by me .  I have reviewed this patient's available data, including medical history, events of note, physical examination, and all test results as part of my evaluation. I have personally reviewed and interpreted all radiology studies.  CBC:  Recent Labs Lab 10/29/16 0943 10/30/16 0529 10/31/16 0256 11/01/16 0129 11/02/16 0310  WBC 9.1 6.7 8.1 10.6* 10.3  HGB 9.3* 9.6* 9.4* 10.4* 9.7*  HCT 28.2* 29.7* 29.5* 32.0* 30.7*  MCV 88.4 89.2 88.9 88.6 87.7  PLT 120* 155 165 195 810   Basic Metabolic Panel:  Recent Labs Lab 10/28/16 0326 10/30/16 0529 10/31/16 0256 11/01/16 0129 11/02/16 0310  NA 141 138 139 138 137  K 3.5 3.3* 3.6 3.6 3.8  CL 104 103 105 104 104  CO2 28 26 25 26 24   GLUCOSE 127* 166* 199* 168* 183*  BUN 12 20 18 15 17   CREATININE 0.60* 0.69 0.60* 0.61 0.57*  CALCIUM 8.3* 8.0* 8.0* 8.5* 8.2*  MG 1.9 2.0  --   --   --   PHOS 3.9 3.5  --   --   --    GFR: Estimated Creatinine Clearance: 68 mL/min (by C-G formula based on SCr of 0.57 mg/dL (L)). Liver Function Tests: No results for input(s): AST, ALT, ALKPHOS, BILITOT, PROT, ALBUMIN in the last 168 hours. No results for input(s): LIPASE, AMYLASE in  the last 168 hours. No results for input(s): AMMONIA in the last 168 hours. Coagulation Profile:  Recent Labs Lab 10/28/16 1119  INR 1.13   Cardiac Enzymes: No results for input(s): CKTOTAL, CKMB, CKMBINDEX, TROPONINI in the last 168 hours. BNP (last 3 results) No results for input(s): PROBNP in the last 8760 hours. HbA1C: No results for input(s): HGBA1C in the last 72 hours. CBG:  Recent Labs Lab 11/02/16 1921 11/02/16 2346 11/03/16 0348 11/03/16 0759 11/03/16 1238  GLUCAP 190* 182* 159* 175* 180*   Lipid Profile: No results for input(s): CHOL, HDL, LDLCALC, TRIG, CHOLHDL, LDLDIRECT in the last 72 hours. Thyroid Function Tests: No results for input(s): TSH, T4TOTAL, FREET4, T3FREE, THYROIDAB in the last 72 hours. Anemia Panel: No results for input(s): VITAMINB12, FOLATE, FERRITIN, TIBC, IRON, RETICCTPCT in the last 72 hours.  Urine analysis:    Component Value Date/Time   COLORURINE AMBER (A) 10/29/2016 1330   APPEARANCEUR TURBID (A) 10/29/2016 1330   LABSPEC 1.024 10/29/2016 1330   PHURINE 8.5 (H) 10/29/2016 1330   GLUCOSEU NEGATIVE 10/29/2016 1330   HGBUR NEGATIVE 10/29/2016 1330   BILIRUBINUR MODERATE (A) 10/29/2016 1330   KETONESUR NEGATIVE 10/29/2016 1330   PROTEINUR 100 (A) 10/29/2016 1330   NITRITE POSITIVE (A) 10/29/2016 1330   LEUKOCYTESUR SMALL (A) 10/29/2016 1330   Sepsis Labs: @LABRCNTIP (procalcitonin:4,lacticidven:4)  ) Recent Results (from the past 240 hour(s))  Culture, blood (routine x 2)     Status: None   Collection Time: 10/29/16 12:20 PM  Result Value Ref Range Status   Specimen Description BLOOD RIGHT HAND  Final   Special Requests BOTTLES DRAWN AEROBIC ONLY 5CC  Final   Culture NO GROWTH 5 DAYS  Final   Report Status 11/03/2016 FINAL  Final  Culture, blood (routine x 2)     Status: None   Collection Time: 10/29/16 12:30 PM  Result Value Ref Range Status   Specimen Description BLOOD LEFT HAND  Final   Special Requests BOTTLES DRAWN  AEROBIC ONLY 5CC  Final   Culture NO GROWTH 5 DAYS  Final   Report Status 11/03/2016 FINAL  Final  Culture, Urine     Status: None   Collection Time: 10/29/16  1:30 PM  Result Value Ref Range Status   Specimen Description URINE, CLEAN CATCH  Final   Special Requests NONE  Final   Culture NO GROWTH  Final   Report Status 10/30/2016 FINAL  Final  Culture, respiratory (NON-Expectorated)     Status: None   Collection Time: 10/30/16  4:09 PM  Result Value Ref Range Status   Specimen Description TRACHEAL ASPIRATE  Final   Special Requests NONE  Final   Gram Stain   Final    MODERATE WBC PRESENT, PREDOMINANTLY PMN MODERATE GRAM POSITIVE RODS FEW GRAM POSITIVE COCCI IN PAIRS    Culture Consistent with normal respiratory flora.  Final   Report Status 11/01/2016 FINAL  Final  Culture, respiratory (NON-Expectorated)     Status: None   Collection Time: 11/01/16  1:52 PM  Result Value Ref Range Status   Specimen Description TRACHEAL ASPIRATE  Final   Special Requests NONE  Final   Gram Stain   Final    MODERATE WBC PRESENT,BOTH PMN AND MONONUCLEAR RARE GRAM POSITIVE COCCI IN PAIRS RARE GRAM NEGATIVE RODS    Culture Consistent with normal respiratory flora.  Final   Report Status 11/03/2016 FINAL  Final         Radiology Studies: Dg Chest Port 1 View  Result Date: 11/02/2016 CLINICAL DATA:  Pneumonia. EXAM: PORTABLE CHEST 1 VIEW FINDINGS: Tracheostomy tube noted good anatomic position. Cardiomegaly. Bilateral pulmonary infiltrates noted suggesting a component of congestive heart failure. Superimposed pneumonia cannot be excluded. Small pleural effusions cannot be excluded. IMPRESSION: 1. Tracheostomy tube in stable position. 2. Cardiomegaly with diffuse bilateral pulmonary interstitial prominence, increased from prior exam. Findings suggest congestive heart failure. Superimposed pneumonia cannot be excluded . Small bilateral pleural effusions cannot be excluded . Electronically Signed    By: Marcello Moores  Register   On: 11/02/2016 08:03        Scheduled Meds: . apixaban  5 mg Oral BID  . bacitracin-polymyxin b   Right Eye QHS  . cefTRIAXone (ROCEPHIN)  IV  1 g Intravenous Q24H  . chlorhexidine gluconate (MEDLINE KIT)  15 mL Mouth Rinse BID  .  ciprofloxacin  2 drop Right Eye TID AC  . hydrALAZINE  50 mg Oral Q8H  . insulin aspart  2-6 Units Subcutaneous Q4H  . mouth rinse  15 mL Mouth Rinse 10 times per day  . pantoprazole  40 mg Oral Daily   Continuous Infusions: . feeding supplement (VITAL AF 1.2 CAL) 1,000 mL (11/02/16 2205)     LOS: 11 days    Time spent: 40 minutes    Gale Hulse, Geraldo Docker, MD [facility] Pager [phone]   If 7PM-7AM, please contact night-coverage [email or web address] Password [email or web address] 11/03/2016, 1:28 PM

## 2016-11-04 ENCOUNTER — Inpatient Hospital Stay (HOSPITAL_COMMUNITY): Payer: Medicare (Managed Care)

## 2016-11-04 LAB — CBC WITH DIFFERENTIAL/PLATELET
BASOS PCT: 0 %
Basophils Absolute: 0 10*3/uL (ref 0.0–0.1)
EOS ABS: 0.3 10*3/uL (ref 0.0–0.7)
EOS PCT: 3 %
HEMATOCRIT: 30.7 % — AB (ref 39.0–52.0)
Hemoglobin: 9.9 g/dL — ABNORMAL LOW (ref 13.0–17.0)
LYMPHS ABS: 1.2 10*3/uL (ref 0.7–4.0)
Lymphocytes Relative: 12 %
MCH: 28.3 pg (ref 26.0–34.0)
MCHC: 32.2 g/dL (ref 30.0–36.0)
MCV: 87.7 fL (ref 78.0–100.0)
MONO ABS: 1 10*3/uL (ref 0.1–1.0)
Monocytes Relative: 10 %
NEUTROS ABS: 7.1 10*3/uL (ref 1.7–7.7)
Neutrophils Relative %: 75 %
Platelets: 292 10*3/uL (ref 150–400)
RBC: 3.5 MIL/uL — ABNORMAL LOW (ref 4.22–5.81)
RDW: 15.9 % — AB (ref 11.5–15.5)
WBC: 9.6 10*3/uL (ref 4.0–10.5)

## 2016-11-04 LAB — GLUCOSE, CAPILLARY
GLUCOSE-CAPILLARY: 169 mg/dL — AB (ref 65–99)
GLUCOSE-CAPILLARY: 171 mg/dL — AB (ref 65–99)
GLUCOSE-CAPILLARY: 172 mg/dL — AB (ref 65–99)
GLUCOSE-CAPILLARY: 184 mg/dL — AB (ref 65–99)
Glucose-Capillary: 185 mg/dL — ABNORMAL HIGH (ref 65–99)

## 2016-11-04 LAB — MAGNESIUM: Magnesium: 2.1 mg/dL (ref 1.7–2.4)

## 2016-11-04 LAB — BASIC METABOLIC PANEL
Anion gap: 11 (ref 5–15)
BUN: 19 mg/dL (ref 6–20)
CALCIUM: 8.2 mg/dL — AB (ref 8.9–10.3)
CO2: 25 mmol/L (ref 22–32)
CREATININE: 0.64 mg/dL (ref 0.61–1.24)
Chloride: 101 mmol/L (ref 101–111)
GFR calc Af Amer: >60 mL/min (ref >60)
GLUCOSE: 165 mg/dL — AB (ref 65–99)
Potassium: 3.5 mmol/L (ref 3.5–5.1)
Sodium: 137 mmol/L (ref 135–145)

## 2016-11-04 LAB — PHOSPHORUS: Phosphorus: 4.2 mg/dL (ref 2.5–4.6)

## 2016-11-04 MED ORDER — INSULIN ASPART 100 UNIT/ML ~~LOC~~ SOLN
2.0000 [IU] | SUBCUTANEOUS | Status: DC
  Administered 2016-11-04 – 2016-11-05 (×6): 4 [IU] via SUBCUTANEOUS
  Administered 2016-11-05: 2 [IU] via SUBCUTANEOUS
  Administered 2016-11-05: 4 [IU] via SUBCUTANEOUS
  Administered 2016-11-05: 2 [IU] via SUBCUTANEOUS
  Administered 2016-11-05 – 2016-11-06 (×5): 4 [IU] via SUBCUTANEOUS
  Administered 2016-11-06 (×2): 2 [IU] via SUBCUTANEOUS
  Administered 2016-11-06: 4 [IU] via SUBCUTANEOUS
  Administered 2016-11-06 – 2016-11-07 (×6): 2 [IU] via SUBCUTANEOUS
  Administered 2016-11-08: 4 [IU] via SUBCUTANEOUS
  Administered 2016-11-08: 6 [IU] via SUBCUTANEOUS
  Administered 2016-11-08: 4 [IU] via SUBCUTANEOUS
  Administered 2016-11-08: 2 [IU] via SUBCUTANEOUS
  Administered 2016-11-08 – 2016-11-09 (×4): 4 [IU] via SUBCUTANEOUS
  Administered 2016-11-09: 6 [IU] via SUBCUTANEOUS
  Administered 2016-11-09: 2 [IU] via SUBCUTANEOUS
  Administered 2016-11-09: 4 [IU] via SUBCUTANEOUS
  Administered 2016-11-09: 2 [IU] via SUBCUTANEOUS
  Administered 2016-11-10: 4 [IU] via SUBCUTANEOUS
  Administered 2016-11-10: 6 [IU] via SUBCUTANEOUS
  Administered 2016-11-10: 2 [IU] via SUBCUTANEOUS
  Administered 2016-11-10: 4 [IU] via SUBCUTANEOUS
  Administered 2016-11-10: 6 [IU] via SUBCUTANEOUS
  Administered 2016-11-11 (×3): 4 [IU] via SUBCUTANEOUS
  Administered 2016-11-11: 2 [IU] via SUBCUTANEOUS

## 2016-11-04 MED ORDER — JEVITY 1.2 CAL PO LIQD
1000.0000 mL | ORAL | Status: DC
  Administered 2016-11-04 – 2016-11-09 (×5): 1000 mL
  Administered 2016-11-10: 55 mL/h
  Administered 2016-11-11 – 2016-11-20 (×9): 1000 mL
  Filled 2016-11-04 (×27): qty 1000

## 2016-11-04 MED ORDER — PANTOPRAZOLE SODIUM 40 MG PO PACK
40.0000 mg | PACK | Freq: Every day | ORAL | Status: DC
  Administered 2016-11-04 – 2016-11-20 (×17): 40 mg
  Filled 2016-11-04 (×19): qty 20

## 2016-11-04 MED ORDER — SODIUM CHLORIDE 0.9% FLUSH
10.0000 mL | INTRAVENOUS | Status: DC | PRN
  Administered 2016-11-08: 10 mL
  Administered 2016-11-13: 20 mL
  Filled 2016-11-04 (×2): qty 40

## 2016-11-04 MED ORDER — PRO-STAT SUGAR FREE PO LIQD
30.0000 mL | Freq: Three times a day (TID) | ORAL | Status: DC
  Administered 2016-11-04 – 2016-11-20 (×48): 30 mL
  Filled 2016-11-04 (×48): qty 30

## 2016-11-04 NOTE — Progress Notes (Signed)
Occupational Therapy Treatment Patient Details Name: Sean [name] Deluna MRN: [id number] DOB: [birth year] Today's Date: 11/04/2016    History of present illness Sean [name] Wauters is a 82 y.o. male admitted with He has expressive aphasia and Not moving left side with right-sided gaze deviation. MRI: Areas of acute infarction in the right MCA territory involving the insula, frontal operculum, anteromedial temporal lobe, and lateral temporal parietal junction s/p revascularization.   OT comments  Pt demonstrates improved arousal, improved activity tolerance and improved sitting balance - able to sit EOB with max A intially, progressing to min/min guard assist.  He continues with rt gaze preference and Lt neglect.    Follow Up Recommendations  SNF;Supervision/Assistance - 24 hour    Equipment Recommendations  Other (comment)    Recommendations for Other Services      Precautions / Restrictions Precautions Precautions: Fall Precaution Comments: trach, peg Restrictions Weight Bearing Restrictions: No       Mobility Bed Mobility Overal bed mobility: Needs Assistance;+2 for physical assistance Bed Mobility: Supine to Sit;Sit to Supine     Supine to sit: Total assist;+2 for physical assistance Sit to supine: Total assist;+2 for physical assistance   General bed mobility comments: pt does not A with bed mobility.    Transfers Overall transfer level: Needs assistance   Transfers: Sit to/from Stand Sit to Stand: Total assist;From elevated surface;+2 physical assistance;+2 safety/equipment         General transfer comment: Assist of 2 to clear bottom from bed withtherapist stabilizing BLEs due to weakness and cues for hip extension/upright. No initiation of LEs noted.     Balance Overall balance assessment: Needs assistance Sitting-balance support: Feet supported Sitting balance-Leahy Scale: Poor Sitting balance - Comments: Initially MAx A for static sitting balance progressing to Min guard  assist. Focused on faciliation of thoracic and lumbar extensors, scapular retraction and cervical positioning while sitting EOB with Max manual cues. ABle to initiate with cues and maintain for a few seconds but fatigues. Postural control: Posterior lean                         ADL Overall ADL's : Needs assistance/impaired Eating/Feeding: NPO   Grooming: Wash/dry hands;Moderate assistance;Sitting                               Functional mobility during ADLs: Maximal assistance;+2 for physical assistance        Vision                 Additional Comments: Pt with Rt gaze preference and Rt eye ptosis    Perception     Praxis      Cognition   Behavior During Therapy: Flat affect Overall Cognitive Status: Difficult to assess          Following Commands: Follows one step commands inconsistently     Problem Solving: Slow processing;Decreased initiation;Requires verbal cues;Requires tactile cues      Extremity/Trunk Assessment               Exercises Other Exercises Other Exercises: Pt able to minimally grasp with Lt UE    Shoulder Instructions       General Comments      Pertinent Vitals/ Pain       Pain Assessment: Faces Faces Pain Scale: Hurts little more Pain Location: with head/neck rotation to the Lt  Pain Descriptors / Indicators: Grimacing Pain Intervention(s): Monitored  during session  Home Living                                          Prior Functioning/Environment              Frequency  Min 2X/week        Progress Toward Goals  OT Goals(current goals can now be found in the care plan section)  Progress towards OT goals: Progressing toward goals  ADL Goals Pt Will Perform Grooming: with mod assist;sitting;bed level Additional ADL Goal #1: Pt will follow 2 out of 10 1-step commands Additional ADL Goal #2: Pt will be able to maintain EOB balance with Mod A at intervals during a 10  minute time period Additional ADL Goal #3: Pt will be able to track left of midline 1 out of 5 trials  Plan Discharge plan remains appropriate    Co-evaluation    PT/OT/SLP Co-Evaluation/Treatment: Yes Reason for Co-Treatment: Complexity of the patient's impairments (multi-system involvement);For patient/therapist safety PT goals addressed during session: Mobility/safety with mobility OT goals addressed during session: Strengthening/ROM      End of Session Equipment Utilized During Treatment: Oxygen   Activity Tolerance Patient tolerated treatment well   Patient Left in bed;with call bell/phone within reach   Nurse Communication Mobility status        Time: 1140-1205 OT Time Calculation (min): 25 min  Charges: OT General Charges $OT Visit: 1 Procedure OT Treatments $Neuromuscular Re-education: 8-22 mins  Thi Klich M 11/04/2016, 3:55 PM

## 2016-11-04 NOTE — Clinical Social Work Note (Signed)
CSW following for discharge needs. Patient currently has one bed offer. Patient has a cuffed trach. Will need to be changed to an uncuffed trach prior to discharge to SNF.  Charlynn CourtSarah Agnieszka Newhouse, CSW [phone]

## 2016-11-04 NOTE — Progress Notes (Signed)
Sean [name] - Stepdown/ICU TEAM  Sean [name]  ZOX:[id number] DOB: 03/27/1926 DOA: 10/23/2016 PCP: No primary care provider on file.    Brief Narrative:  82 y/o M admitted 11/10 with c/o AMS, no movement on left side, unable to speak.  Found to have R ICA occlusion / MCA CVA.  To Neuro IR for thrombectomy, returned to ICU on vent.   Significant Events: 11/10 admit w/ acute CVA - to IR for recanalization of R ICA - intubated 11/16 tracheostomy and PEG placed  11/20 TRH assumed cre   Subjective: The patient is not able to communicate with me.  There is no family present.  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  Felt to be embolic in setting of afib w/ Pradaxa noncompliance - anticoag now resumed - OT/PT now suggesting SNF   Acute Respiratory Failure secondary to CVA Trach 11/16 - ongoing tracheostomy care per PCCM - on TC at time of exam today - having signif difficulty w/ trach secretions and some bleeding today   Chronic Afib Noncompliant w/ Pradaxa - rate controlled at this time - resumed anticoag    HTN Reasonably well controlled at the present time   HLD LDL 53 - f/u as outpt  Hypokalemia Corrected  Dysphagia  PEG 11/16 - continue tube feeds   Thrombocytopenia unknown baseline, on chronic anticoagulation - platelet count has normalized   Normocytic Anemia No gross evidence of blood loss - hemoglobin stable  Recent Labs Lab 10/30/16 0529 10/31/16 0256 11/01/16 0129 11/02/16 0310 11/04/16 0550  HGB 9.6* 9.4* 10.4* 9.7* 9.9*    HCAP - ? Effusion Left base  Antibiotic therapy initiated by PCCM 11/17 - stop abx today and follow - CXR w/o signif change today   Hyperglycemia  A1c 6.2 - consistent with prediabetic state- stable for now   DVT prophylaxis: eliquis Code Status: FULL CODE Family Communication: no family present at time of exam  Disposition Plan:  SNF once trach care more stable    Consultants:  PCCM Neurology   Antimicrobials:  Ceftriaxone 11/17 > 11/22  Objective: Blood pressure 113/67, pulse 83, temperature 98.6 F (37 C), temperature source Oral, resp. rate 20, height 5\' 7"  ([name].702 m), weight 89.4 kg (197 lb [name].5 oz), SpO2 100 %.  Intake/Output Summary (Last 24 hours) at 11/04/16 1326 Last data filed at 11/04/16 1100  Gross per 24 hour  Intake             1870 ml  Output             3900 ml  Net            -2030 ml   Filed Weights   11/02/16 0500 11/03/16 0500 11/04/16 0424  Weight: 89.4 kg (197 lb [name].5 oz) 89.[name] kg (196 lb 6.9 oz) 89.4 kg (197 lb [name].5 oz)    Examination: General: No acute respiratory distress Lungs: Coarse upper airway sounds transmitted throughout - tan secretions at trach   Cardiovascular: irregularly irregular with rate controlled  Abdomen: Nondistended, soft, bowel sounds positive Extremities: No significant edema bilateral lower extremities  CBC:  Recent Labs Lab 10/30/16 0529 10/31/16 0256 11/01/16 0129 11/02/16 0310 11/04/16 0550  WBC 6.7 8.[name] 10.6* 10.3 9.6  NEUTROABS  --   --   --   --  7.[name]  HGB 9.6* 9.4* 10.4* 9.7* 9.9*  HCT 29.7* 29.5* 32.0* 30.7* 30.7*  MCV 89.2 88.9 88.6 87.7 87.7  PLT 155 165 195 215 292   Basic Metabolic Panel:  Recent Labs Lab 10/30/16 0529 10/31/16 0256 11/01/16 0129 11/02/16 0310 11/04/16 0550  NA 138 139 138 137 137  K 3.3* 3.6 3.6 3.8 3.5  CL 103 105 104 104 101  CO2 26 25 26 24 25   GLUCOSE 166* 199* 168* 183* 165*  BUN 20 18 15 17 19   CREATININE 0.69 0.60* 0.61 0.57* 0.64  CALCIUM 8.0* 8.0* 8.5* 8.2* 8.2*  MG 2.0  --   --   --  2.[name]  PHOS 3.5  --   --   --  4.2   GFR: Estimated Creatinine Clearance: 68.[name] mL/min (by C-G formula based on SCr of 0.64 mg/dL).  Liver Function Tests: No results for input(s): AST, ALT, ALKPHOS, BILITOT, PROT, ALBUMIN in the last 168 hours. No results for input(s): LIPASE, AMYLASE in the last 168 hours. No results for input(s): AMMONIA in  the last 168 hours.  HbA1C: Hgb A1c MFr Bld  Date/Time Value Ref Range Status  10/24/2016 05:00 AM 6.2 (H) 4.8 - 5.6 % Final    Comment:    (NOTE)         Pre-diabetes: 5.7 - 6.4         Diabetes: >6.4         Glycemic control for adults with diabetes: <7.0     CBG:  Recent Labs Lab 11/03/16 1922 11/03/16 2256 11/04/16 0328 11/04/16 0736 11/04/16 1210  GLUCAP 141* 170* 185* 169* 171*    Recent Results (from the past 240 hour(s))  Culture, blood (routine x 2)     Status: None   Collection Time: 10/29/16 12:20 PM  Result Value Ref Range Status   Specimen Description BLOOD RIGHT HAND  Final   Special Requests BOTTLES DRAWN AEROBIC ONLY 5CC  Final   Culture NO GROWTH 5 DAYS  Final   Report Status 11/03/2016 FINAL  Final  Culture, blood (routine x 2)     Status: None   Collection Time: 10/29/16 12:30 PM  Result Value Ref Range Status   Specimen Description BLOOD LEFT HAND  Final   Special Requests BOTTLES DRAWN AEROBIC ONLY 5CC  Final   Culture NO GROWTH 5 DAYS  Final   Report Status 11/03/2016 FINAL  Final  Culture, Urine     Status: None   Collection Time: 10/29/16  [name]:30 PM  Result Value Ref Range Status   Specimen Description URINE, CLEAN CATCH  Final   Special Requests NONE  Final   Culture NO GROWTH  Final   Report Status 10/30/2016 FINAL  Final  Culture, respiratory (NON-Expectorated)     Status: None   Collection Time: 10/30/16  4:09 PM  Result Value Ref Range Status   Specimen Description TRACHEAL ASPIRATE  Final   Special Requests NONE  Final   Gram Stain   Final    MODERATE WBC PRESENT, PREDOMINANTLY PMN MODERATE GRAM POSITIVE RODS FEW GRAM POSITIVE COCCI IN PAIRS    Culture Consistent with normal respiratory flora.  Final   Report Status 11/01/2016 FINAL  Final  Culture, respiratory (NON-Expectorated)     Status: None   Collection Time: 11/01/16  [name]:52 PM  Result Value Ref Range Status   Specimen Description TRACHEAL ASPIRATE  Final   Special  Requests NONE  Final   Gram Stain   Final    MODERATE WBC PRESENT,BOTH PMN AND MONONUCLEAR RARE GRAM POSITIVE COCCI IN PAIRS RARE GRAM NEGATIVE RODS    Culture  Consistent with normal respiratory flora.  Final   Report Status 11/03/2016 FINAL  Final     Scheduled Meds: . acetylcysteine  4 mL Nebulization Q6H  . albuterol  2.5 mg Nebulization Q6H  . apixaban  5 mg Oral BID  . bacitracin-polymyxin b   Right Eye QHS  . cefTRIAXone (ROCEPHIN)  IV  [name] g Intravenous Q24H  . chlorhexidine  15 mL Mouth Rinse BID  . ciprofloxacin  2 drop Right Eye TID AC  . feeding supplement (PRO-STAT SUGAR FREE 64)  30 mL Per Tube TID  . furosemide  40 mg Intravenous Q12H  . hydrALAZINE  50 mg Oral Q8H  . insulin aspart  2-6 Units Subcutaneous Q4H  . mouth rinse  15 mL Mouth Rinse 10 times per day  . pantoprazole sodium  40 mg Per Tube Daily   Continuous Infusions: . feeding supplement (JEVITY [name].2 CAL)       LOS: 12 days   Lonia BloodJeffrey T. Marifer Hurd, MD [facility] Office  [phone] Pager - Text Page per Amion as per below:  On-Call/Text Page:      [email or web address]      password [email or web address]  If 7PM-7AM, please contact night-coverage [email or web address] Password [email or web address] 11/04/2016, [name]:26 PM

## 2016-11-04 NOTE — Progress Notes (Addendum)
Physical Therapy Treatment Patient Details Name: Sean AbedKong [name] MRN: [id number] DOB: [birth year] Today's Date: 11/04/2016    History of Present Illness Sean [name] is a 82 y.o. male admitted with He has expressive aphasia and Not moving left side with right-sided gaze deviation. MRI: Areas of acute infarction in the right MCA territory involving the insula, frontal operculum, anteromedial temporal lobe, and lateral temporal parietal junction s/p revascularization.    PT Comments    Patient alert today. Pt with strong RUE and continues to demonstrate left neglect. Focused on postural reeducation by providing facilitation of thoracic and lumbar extensors, scapular retraction and cervical AROM. After exercises, pt able to sit EOB with Min guard assist and initiate trunk musculature with Mod-Max cues but not able to maintain. Attempted to stand however pt unable. Will continue to follow.   Follow Up Recommendations  SNF     Equipment Recommendations  None recommended by PT    Recommendations for Other Services       Precautions / Restrictions Precautions Precautions: Fall Precaution Comments: trach, peg Restrictions Weight Bearing Restrictions: No    Mobility  Bed Mobility Overal bed mobility: Needs Assistance;+2 for physical assistance Bed Mobility: Supine to Sit;Sit to Supine     Supine to sit: Total assist;+2 for physical assistance Sit to supine: Total assist;+2 for physical assistance   General bed mobility comments: pt does not A with bed mobility.    Transfers Overall transfer level: Needs assistance   Transfers: (P) Sit to/from Stand Sit to Stand: Total assist;From elevated surface;+2 physical assistance;+2 safety/equipment         General transfer comment: Assist of 2 to clear bottom from bed withtherapist stabilizing BLEs due to weakness and cues for hip extension/upright. No initiation of LEs noted.   Ambulation/Gait                 Stairs             Wheelchair Mobility    Modified Rankin (Stroke Patients Only) Modified Rankin (Stroke Patients Only) Pre-Morbid Rankin Score: Moderately severe disability Modified Rankin: Severe disability     Balance Overall balance assessment: Needs assistance Sitting-balance support: Feet supported;Single extremity supported Sitting balance-Leahy Scale: Poor Sitting balance - Comments: Initially MAx A for static sitting balance progressing to Min guard assist. Focused on faciliation of thoracic and lumbar extensors, scapular retraction and cervical positioning while sitting EOB with Max manual cues. ABle to initiate with cues and maintain for a few seconds but fatigues.                            Cognition Arousal/Alertness: Awake/alert Behavior During Therapy: Flat affect Overall Cognitive Status: Difficult to assess                      Exercises      General Comments        Pertinent Vitals/Pain Pain Assessment: Faces Faces Pain Scale: Hurts little more Pain Location: grimaces with cervical AROM Pain Descriptors / Indicators: Grimacing Pain Intervention(s): Monitored during session;Repositioned    Home Living                      Prior Function            PT Goals (current goals can now be found in the care plan section) Progress towards PT goals: Progressing toward goals    Frequency    Min 3X/week  PT Plan Current plan remains appropriate    Co-evaluation PT/OT/SLP Co-Evaluation/Treatment: Yes Reason for Co-Treatment: Complexity of the patient's impairments (multi-system involvement);For patient/therapist safety PT goals addressed during session: Mobility/safety with mobility;Balance;Strengthening/ROM       End of Session Equipment Utilized During Treatment: Other (comment) (trach collar) Activity Tolerance: Patient tolerated treatment well Patient left: in bed;with call bell/phone within reach;with bed alarm set      Time: 1139-1205 PT Time Calculation (min) (ACUTE ONLY): 26 min  Charges:  $Therapeutic Activity: 8-22 mins                    G Codes:      Savannaha Stonerock A Jennalee Greaves 11/04/2016, 1:08 PM  Mylo RedShauna Luian Schumpert, PT, DPT [phone]

## 2016-11-04 NOTE — Progress Notes (Signed)
Nutrition Follow-up  DOCUMENTATION CODES:   Not applicable  INTERVENTION:    Change TF to a standard formula: Jevity 1.2 at 55 ml/h with Prostat 30 ml TID to provide 1884 kcal, 118 gm protein, 1069 ml free water daily.  NUTRITION DIAGNOSIS:   Inadequate oral intake related to inability to eat as evidenced by NPO status.  Ongoing  GOAL:   Patient will meet greater than or equal to 90% of their needs  Met  MONITOR:   Labs, I & O's, TF tolerance  ASSESSMENT:   Pt admitted with acute onset of aphasia and left hemiplegia with occluded RT ICA, RT MCA, and RT ACA with complete revasuclaraztion.  Patient remains on trach collar. PEG in place: Vital AF 1.2 infusing at 70 ml/h to provide 2016 kcal (100% estimated energy needs), 126 grams protein (101% estimated protein needs), and 1361 mL free water daily. Labs and medications reviewed.   Diet Order:  Diet NPO time specified  Skin:  Reviewed, no issues  Last BM:  11/18  Height:   Ht Readings from Last 1 Encounters:  10/25/16 _0  (1.702 m)    Weight:   Wt Readings from Last 1 Encounters:  11/04/16 197 lb 1.5 oz (89.4 kg)    Ideal Body Weight:  80.9 kg  BMI:  Body mass index is 30.87 kg/m.  Estimated Nutritional Needs:   Kcal:  1800-2000  Protein:  105-125 gm  Fluid:  2 L  EDUCATION NEEDS:   No education needs identified at this time  Molli Barrows, [place], [place], [place] Pager [phone] After Hours Pager [phone]

## 2016-11-05 LAB — CBC WITH DIFFERENTIAL/PLATELET
BASOS PCT: 1 %
Basophils Absolute: 0.1 10*3/uL (ref 0.0–0.1)
EOS PCT: 3 %
Eosinophils Absolute: 0.3 10*3/uL (ref 0.0–0.7)
HEMATOCRIT: 31.1 % — AB (ref 39.0–52.0)
HEMOGLOBIN: 10.1 g/dL — AB (ref 13.0–17.0)
LYMPHS ABS: 1.3 10*3/uL (ref 0.7–4.0)
LYMPHS PCT: 15 %
MCH: 28.5 pg (ref 26.0–34.0)
MCHC: 32.5 g/dL (ref 30.0–36.0)
MCV: 87.9 fL (ref 78.0–100.0)
MONOS PCT: 9 %
Monocytes Absolute: 0.8 10*3/uL (ref 0.1–1.0)
NEUTROS ABS: 6.4 10*3/uL (ref 1.7–7.7)
Neutrophils Relative %: 72 %
Platelets: 306 10*3/uL (ref 150–400)
RBC: 3.54 MIL/uL — ABNORMAL LOW (ref 4.22–5.81)
RDW: 15.8 % — AB (ref 11.5–15.5)
WBC: 8.9 10*3/uL (ref 4.0–10.5)

## 2016-11-05 LAB — GLUCOSE, CAPILLARY
GLUCOSE-CAPILLARY: 163 mg/dL — AB (ref 65–99)
GLUCOSE-CAPILLARY: 192 mg/dL — AB (ref 65–99)
GLUCOSE-CAPILLARY: 147 mg/dL — AB (ref 65–99)
GLUCOSE-CAPILLARY: 149 mg/dL — AB (ref 65–99)
Glucose-Capillary: 161 mg/dL — ABNORMAL HIGH (ref 65–99)
Glucose-Capillary: 173 mg/dL — ABNORMAL HIGH (ref 65–99)
Glucose-Capillary: 184 mg/dL — ABNORMAL HIGH (ref 65–99)

## 2016-11-05 LAB — BASIC METABOLIC PANEL
ANION GAP: 9 (ref 5–15)
BUN: 23 mg/dL — AB (ref 6–20)
CHLORIDE: 102 mmol/L (ref 101–111)
CO2: 28 mmol/L (ref 22–32)
Calcium: 8.3 mg/dL — ABNORMAL LOW (ref 8.9–10.3)
Creatinine, Ser: 0.72 mg/dL (ref 0.61–1.24)
GFR calc Af Amer: >60 mL/min (ref >60)
GFR calc non Af Amer: >60 mL/min (ref >60)
GLUCOSE: 166 mg/dL — AB (ref 65–99)
POTASSIUM: 3.6 mmol/L (ref 3.5–5.1)
Sodium: 139 mmol/L (ref 135–145)

## 2016-11-05 MED ORDER — HYDRALAZINE HCL 50 MG PO TABS
50.0000 mg | ORAL_TABLET | Freq: Three times a day (TID) | ORAL | Status: DC
  Administered 2016-11-05 – 2016-11-20 (×43): 50 mg
  Filled 2016-11-05 (×42): qty 1

## 2016-11-05 MED ORDER — ACETAMINOPHEN 160 MG/5ML PO SOLN
650.0000 mg | Freq: Four times a day (QID) | ORAL | Status: DC | PRN
Start: 2016-11-05 — End: 2016-11-05

## 2016-11-05 MED ORDER — FENTANYL CITRATE (PF) 100 MCG/2ML IJ SOLN
25.0000 ug | INTRAMUSCULAR | Status: DC | PRN
  Administered 2016-11-06: 50 ug via INTRAVENOUS
  Filled 2016-11-05: qty 2

## 2016-11-05 MED ORDER — FREE WATER
100.0000 mL | Freq: Three times a day (TID) | Status: DC
  Administered 2016-11-05 – 2016-11-20 (×45): 100 mL

## 2016-11-05 MED ORDER — ACETAMINOPHEN 160 MG/5ML PO SOLN
650.0000 mg | Freq: Four times a day (QID) | ORAL | Status: DC | PRN
  Administered 2016-11-12: 650 mg
  Filled 2016-11-05: qty 20.3

## 2016-11-05 NOTE — Progress Notes (Signed)
[place] TEAM 1 - Stepdown/ICU TEAM  Crestone Hahne  ZOX:[id number] DOB: 03-02-1926 DOA: 10/23/2016 PCP: No primary care provider on file.    Brief Narrative:  82 y/o M admitted 11/10 with c/o AMS, no movement on left side, unable to speak.  Found to have R ICA occlusion / MCA CVA.  To Neuro IR for thrombectomy, returned to ICU on vent.   Significant Events: 11/10 admit w/ acute CVA - to IR for recanalization of R ICA - intubated 11/16 tracheostomy and PEG placed  11/20 TRH assumed cre   Subjective: The patient is not able to communicate with me.  There is no family present.  He appears to be resting comfortably without evidence of respiratory distress.  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  Felt to be embolic in setting of afib w/ Pradaxa noncompliance - anticoag now resumed - OT/PT suggesting SNF   Acute Respiratory Failure secondary to CVA Trach 11/16 - ongoing tracheostomy care per PCCM - on TC - will need to have trach changed to cuff less prior to transition to SNF  Chronic Afib Noncompliant w/ Pradaxa - rate controlled at this time - resumed anticoag via PEG    HTN Reasonably well controlled at the present time   HLD LDL 53 - f/u as outpt  Hypokalemia Corrected  Dysphagia  PEG 11/16 - continue tube feeds   Thrombocytopenia unknown baseline, on chronic anticoagulation - platelet count has normalized   Normocytic Anemia No gross evidence of blood loss - hemoglobin stable even on anticoag   Recent Labs Lab 10/31/16 0256 11/01/16 0129 11/02/16 0310 11/04/16 0550 11/05/16 0537  HGB 9.4* 10.4* 9.7* 9.9* 10.1*    HCAP - ? Effusion Left base  Antibiotic therapy initiated by PCCM 11/17 - CXR w/o signif change 11/22 - has completed an abx course   Hyperglycemia  A1c 6.2 - consistent with prediabetic state- stable for now   DVT prophylaxis: eliquis Code Status: FULL CODE Family Communication: no family  present at time of exam  Disposition Plan:  SNF pending - needs change to cuffless trach prior to d/c   Consultants:  PCCM Neurology   Antimicrobials:  Ceftriaxone 11/17 > 11/22  Objective: Blood pressure (!) 137/45, pulse 81, temperature 98.9 F (37.2 C), temperature source Axillary, resp. rate (!) 25, height 5\' 7"  (1.702 m), weight 83.3 kg (183 lb 10.3 oz), SpO2 99 %.  Intake/Output Summary (Last 24 hours) at 11/05/16 1319 Last data filed at 11/05/16 1211  Gross per 24 hour  Intake          1201.92 ml  Output             2500 ml  Net         -1298.08 ml   Filed Weights   11/03/16 0500 11/04/16 0424 11/05/16 0341  Weight: 89.1 kg (196 lb 6.9 oz) 89.4 kg (197 lb 1.5 oz) 83.3 kg (183 lb 10.3 oz)    Examination: General: No acute respiratory distress evident  Lungs: Coarse upper airway sounds transmitted throughout all fields  Cardiovascular: irregularly irregular  Abdomen: Nondistended, soft, bowel sounds positive Extremities: No edema bilateral lower extremities  CBC:  Recent Labs Lab 10/31/16 0256 11/01/16 0129 11/02/16 0310 11/04/16 0550 11/05/16 0537  WBC 8.1 10.6* 10.3 9.6 8.9  NEUTROABS  --   --   --  7.1 6.4  HGB 9.4* 10.4* 9.7* 9.9* 10.1*  HCT 29.5* 32.0* 30.7* 30.7* 31.1*  MCV 88.9 88.6 87.7 87.7 87.9  PLT 165 195 215 292 306   Basic Metabolic Panel:  Recent Labs Lab 10/30/16 0529 10/31/16 0256 11/01/16 0129 11/02/16 0310 11/04/16 0550 11/05/16 0537  NA 138 139 138 137 137 139  K 3.3* 3.6 3.6 3.8 3.5 3.6  CL 103 105 104 104 101 102  CO2 26 25 26 24 25 28   GLUCOSE 166* 199* 168* 183* 165* 166*  BUN 20 18 15 17 19  23*  CREATININE 0.69 0.60* 0.61 0.57* 0.64 0.72  CALCIUM 8.0* 8.0* 8.5* 8.2* 8.2* 8.3*  MG 2.0  --   --   --  2.1  --   PHOS 3.5  --   --   --  4.2  --    GFR: Estimated Creatinine Clearance: 65.9 mL/min (by C-G formula based on SCr of 0.72 mg/dL).  Liver Function Tests: No results for input(s): AST, ALT, ALKPHOS, BILITOT,  PROT, ALBUMIN in the last 168 hours. No results for input(s): LIPASE, AMYLASE in the last 168 hours. No results for input(s): AMMONIA in the last 168 hours.  HbA1C: Hgb A1c MFr Bld  Date/Time Value Ref Range Status  10/24/2016 05:00 AM 6.2 (H) 4.8 - 5.6 % Final    Comment:    (NOTE)         Pre-diabetes: 5.7 - 6.4         Diabetes: >6.4         Glycemic control for adults with diabetes: <7.0     CBG:  Recent Labs Lab 11/04/16 2032 11/05/16 0011 11/05/16 0342 11/05/16 0835 11/05/16 1215  GLUCAP 184* 173* 163* 161* 192*    Recent Results (from the past 240 hour(s))  Culture, blood (routine x 2)     Status: None   Collection Time: 10/29/16 12:20 PM  Result Value Ref Range Status   Specimen Description BLOOD RIGHT HAND  Final   Special Requests BOTTLES DRAWN AEROBIC ONLY 5CC  Final   Culture NO GROWTH 5 DAYS  Final   Report Status 11/03/2016 FINAL  Final  Culture, blood (routine x 2)     Status: None   Collection Time: 10/29/16 12:30 PM  Result Value Ref Range Status   Specimen Description BLOOD LEFT HAND  Final   Special Requests BOTTLES DRAWN AEROBIC ONLY 5CC  Final   Culture NO GROWTH 5 DAYS  Final   Report Status 11/03/2016 FINAL  Final  Culture, Urine     Status: None   Collection Time: 10/29/16  1:30 PM  Result Value Ref Range Status   Specimen Description URINE, CLEAN CATCH  Final   Special Requests NONE  Final   Culture NO GROWTH  Final   Report Status 10/30/2016 FINAL  Final  Culture, respiratory (NON-Expectorated)     Status: None   Collection Time: 10/30/16  4:09 PM  Result Value Ref Range Status   Specimen Description TRACHEAL ASPIRATE  Final   Special Requests NONE  Final   Gram Stain   Final    MODERATE WBC PRESENT, PREDOMINANTLY PMN MODERATE GRAM POSITIVE RODS FEW GRAM POSITIVE COCCI IN PAIRS    Culture Consistent with normal respiratory flora.  Final   Report Status 11/01/2016 FINAL  Final  Culture, respiratory (NON-Expectorated)     Status:  None   Collection Time: 11/01/16  1:52 PM  Result Value Ref Range Status   Specimen Description TRACHEAL ASPIRATE  Final   Special Requests NONE  Final   Gram Stain   Final  MODERATE WBC PRESENT,BOTH PMN AND MONONUCLEAR RARE GRAM POSITIVE COCCI IN PAIRS RARE GRAM NEGATIVE RODS    Culture Consistent with normal respiratory flora.  Final   Report Status 11/03/2016 FINAL  Final     Scheduled Meds: . acetylcysteine  4 mL Nebulization Q6H  . albuterol  2.5 mg Nebulization Q6H  . apixaban  5 mg Oral BID  . bacitracin-polymyxin b   Right Eye QHS  . chlorhexidine  15 mL Mouth Rinse BID  . ciprofloxacin  2 drop Right Eye TID AC  . feeding supplement (PRO-STAT SUGAR FREE 64)  30 mL Per Tube TID  . hydrALAZINE  50 mg Oral Q8H  . insulin aspart  2-6 Units Subcutaneous Q4H  . mouth rinse  15 mL Mouth Rinse 10 times per day  . pantoprazole sodium  40 mg Per Tube Daily   Continuous Infusions: . feeding supplement (JEVITY 1.2 CAL) 1,000 mL (11/05/16 1209)     LOS: 13 days   Lonia BloodJeffrey T. Marqui Formby, MD [facility] Office  [phone] Pager - Text Page per Amion as per below:  On-Call/Text Page:      [email or web address]      password [email or web address]  If 7PM-7AM, please contact night-coverage [email or web address] Password [email or web address] 11/05/2016, 1:19 PM

## 2016-11-06 ENCOUNTER — Inpatient Hospital Stay (HOSPITAL_COMMUNITY): Payer: Medicare (Managed Care)

## 2016-11-06 DIAGNOSIS — Z430 Encounter for attention to tracheostomy: Secondary | ICD-10-CM

## 2016-11-06 LAB — HEMOGLOBIN AND HEMATOCRIT, BLOOD
HCT: 30.1 % — ABNORMAL LOW (ref 39.0–52.0)
Hemoglobin: 9.9 g/dL — ABNORMAL LOW (ref 13.0–17.0)

## 2016-11-06 LAB — BASIC METABOLIC PANEL
ANION GAP: 9 (ref 5–15)
BUN: 28 mg/dL — ABNORMAL HIGH (ref 6–20)
CHLORIDE: 101 mmol/L (ref 101–111)
CO2: 27 mmol/L (ref 22–32)
CREATININE: 0.82 mg/dL (ref 0.61–1.24)
Calcium: 8.1 mg/dL — ABNORMAL LOW (ref 8.9–10.3)
GFR calc non Af Amer: >60 mL/min (ref >60)
Glucose, Bld: 182 mg/dL — ABNORMAL HIGH (ref 65–99)
POTASSIUM: 4.3 mmol/L (ref 3.5–5.1)
SODIUM: 137 mmol/L (ref 135–145)

## 2016-11-06 LAB — BLOOD GAS, ARTERIAL
ACID-BASE EXCESS: 3.8 mmol/L — AB (ref 0.0–2.0)
Acid-Base Excess: 1.6 mmol/L (ref 0.0–2.0)
Bicarbonate: 25.2 mmol/L (ref 20.0–28.0)
Bicarbonate: 27 mmol/L (ref 20.0–28.0)
DRAWN BY: 232811
DRAWN BY: 441371
FIO2: 70
FIO2: 60
O2 SAT: 97.2 %
O2 Saturation: 91.9 %
PATIENT TEMPERATURE: 98.8
PCO2 ART: 39.8 mmHg (ref 32.0–48.0)
PCO2 ART: 35 mmHg (ref 32.0–48.0)
PEEP/CPAP: 5 cmH2O
PH ART: 7.426 (ref 7.350–7.450)
PH ART: 7.5 — AB (ref 7.350–7.450)
Patient temperature: 101.9
RATE: 16 resp/min
VT: 610 mL
pO2, Arterial: 74.3 mmHg — ABNORMAL LOW (ref 83.0–108.0)
pO2, Arterial: 91.9 mmHg (ref 83.0–108.0)

## 2016-11-06 LAB — CBC
HEMATOCRIT: 34.8 % — AB (ref 39.0–52.0)
HEMATOCRIT: 31.2 % — AB (ref 39.0–52.0)
HEMOGLOBIN: 11.1 g/dL — AB (ref 13.0–17.0)
HEMOGLOBIN: 9.9 g/dL — AB (ref 13.0–17.0)
MCH: 28.6 pg (ref 26.0–34.0)
MCH: 28.2 pg (ref 26.0–34.0)
MCHC: 31.9 g/dL (ref 30.0–36.0)
MCHC: 31.7 g/dL (ref 30.0–36.0)
MCV: 89.7 fL (ref 78.0–100.0)
MCV: 88.9 fL (ref 78.0–100.0)
Platelets: 396 10*3/uL (ref 150–400)
Platelets: 353 10*3/uL (ref 150–400)
RBC: 3.88 MIL/uL — ABNORMAL LOW (ref 4.22–5.81)
RBC: 3.51 MIL/uL — AB (ref 4.22–5.81)
RDW: 16 % — AB (ref 11.5–15.5)
RDW: 15.7 % — ABNORMAL HIGH (ref 11.5–15.5)
WBC: 14.7 10*3/uL — ABNORMAL HIGH (ref 4.0–10.5)
WBC: 13.2 10*3/uL — AB (ref 4.0–10.5)

## 2016-11-06 LAB — GLUCOSE, CAPILLARY
Glucose-Capillary: 164 mg/dL — ABNORMAL HIGH (ref 65–99)
Glucose-Capillary: 178 mg/dL — ABNORMAL HIGH (ref 65–99)
Glucose-Capillary: 181 mg/dL — ABNORMAL HIGH (ref 65–99)
Glucose-Capillary: 189 mg/dL — ABNORMAL HIGH (ref 65–99)

## 2016-11-06 LAB — APTT: APTT: 40 s — AB (ref 24–36)

## 2016-11-06 LAB — PROTIME-INR
INR: 1.39
Prothrombin Time: 17.2 seconds — ABNORMAL HIGH (ref 11.4–15.2)

## 2016-11-06 LAB — PREPARE RBC (CROSSMATCH)

## 2016-11-06 LAB — ABO/RH: ABO/RH(D): B POS

## 2016-11-06 LAB — PHOSPHORUS: PHOSPHORUS: 4.7 mg/dL — AB (ref 2.5–4.6)

## 2016-11-06 LAB — MAGNESIUM: MAGNESIUM: 2.6 mg/dL — AB (ref 1.7–2.4)

## 2016-11-06 MED ORDER — FENTANYL CITRATE (PF) 100 MCG/2ML IJ SOLN
50.0000 ug | INTRAMUSCULAR | Status: AC | PRN
Start: 2016-11-06 — End: 2016-11-07
  Administered 2016-11-07 (×3): 50 ug via INTRAVENOUS
  Filled 2016-11-06 (×4): qty 2

## 2016-11-06 MED ORDER — FENTANYL CITRATE (PF) 100 MCG/2ML IJ SOLN
50.0000 ug | INTRAMUSCULAR | Status: DC | PRN
  Administered 2016-11-07 – 2016-11-16 (×15): 50 ug via INTRAVENOUS
  Filled 2016-11-06 (×14): qty 2

## 2016-11-06 MED ORDER — PROTHROMBIN COMPLEX CONC HUMAN 500 UNITS IV KIT
4000.0000 [IU] | PACK | Status: DC
  Filled 2016-11-06: qty 160

## 2016-11-06 MED ORDER — SODIUM CHLORIDE 0.9 % IV SOLN
Freq: Once | INTRAVENOUS | Status: AC
  Administered 2016-11-08: 20:00:00 via INTRAVENOUS

## 2016-11-06 MED ORDER — "THROMBI-PAD 3""X3"" EX PADS"
1.0000 | MEDICATED_PAD | Freq: Once | CUTANEOUS | Status: AC
  Administered 2016-11-06: 1 via TOPICAL
  Filled 2016-11-06: qty 1

## 2016-11-06 NOTE — Progress Notes (Signed)
PT Cancellation Note  Patient Details Name: Sean AbedKong Xiang MRN: [id number] DOB: 1926/04/10   Cancelled Treatment:    Reason Eval/Treat Not Completed: Medical issues which prohibited therapy. Pt's nurse requesting PT hold for today secondary to bleeding noted around his trach. Pt's nurse requesting 24 hour period of little to no mobility at this time. PT will continue to f/u with pt as appropriate.   Alessandra BevelsJennifer M Shafiq Larch 11/06/2016, 9:42 AM Deborah ChalkJennifer Truett Mcfarlan, PT, DPT [phone]

## 2016-11-06 NOTE — Progress Notes (Signed)
Okay to hold blood transfusion HGB 9.9

## 2016-11-06 NOTE — Progress Notes (Signed)
Transferred patient to CCU in bed respiratory at bedside.  Trip was uneventful with no change in respiratory, cardiac or bleeding status.  Report given prior to move and updated at bedside.  Kcentra bag passed on to receiving nurse.  Dayshift nurse also present for arrival to floor and updated.

## 2016-11-06 NOTE — Progress Notes (Signed)
Rehab admissions - I am signing off for inpatient rehab at this time.  Patient is unable to fully participate with therapies and his wife cannot manage at home after a potentially short inpatient rehab stay.  Patient will need SNF to allow sufficient recovery time.  [phone]

## 2016-11-06 NOTE — Progress Notes (Addendum)
PCCM Interval Note  Kcentra ordered several hours ago, wasn't given as bleeding had appeared to slow down.  Trach bleeding appears to have subsided for now.  Currently on 1.00 FiO2 w SpO2 99%  Vitals:   11/06/16 0305 11/06/16 0311 11/06/16 0319 11/06/16 0800  BP:   (!) 122/58 105/65  Pulse: 89  95 82  Resp: 20  (!) 22 16  Temp:   99.2 F (37.3 C) 98.8 F (37.1 C)  TempSrc:   Axillary Axillary  SpO2: 96% 96% 96% 100%  Weight:   82.7 kg (182 lb 5.1 oz)   Height:       Gen: ill appearing man, trach in place, unresponsive  ENT: No apparent oral lesions  Neck: trach site with old blood clot present, no active blood seen  Lungs: On MV, coarse BS. No evidence hemoptysis  Cardiovascular: RRR, heart sounds normal, no murmur or gallops, trace peripheral edema  Abdomen: soft and NT, no HSM,  BS normal  Musculoskeletal: No deformities, no cyanosis or clubbing  Neuro: no response to voice, withdraws R UE to pain  Skin: Warm, no lesions or rashes   Recent Labs Lab 11/05/16 0537 11/06/16 0519 11/06/16 0831  HGB 10.1* 11.1* 9.9*  HCT 31.1* 34.8* 31.2*  WBC 8.9 14.7* 13.2*  PLT 306 396 353    Recent Labs Lab 10/31/16 0256 11/01/16 0129 11/02/16 0310 11/04/16 0550 11/05/16 0537  NA 139 138 137 137 139  K 3.6 3.6 3.8 3.5 3.6  CL 105 104 104 101 102  CO2 25 26 24 25 28   GLUCOSE 199* 168* 183* 165* 166*  BUN 18 15 17 19  23*  CREATININE 0.60* 0.61 0.57* 0.64 0.72  CALCIUM 8.0* 8.5* 8.2* 8.2* 8.3*  MG  --   --   --  2.1  --   PHOS  --   --   --  4.2  --    No results for input(s): INR in the last 168 hours.  Recent Labs Lab 11/06/16 0535  PHART 7.426  PCO2ART 39.8  PO2ART 74.3*  HCO3 25.2  O2SAT 91.9    Plans:  Anticoagulation held Wean FiO2 quickly as he can tolerate Goal back to ATC soon DO NOT manipulate trach for today 11/24, MINIMIZE suctioning Will defer Kcentra for now as bleeding stabilized before it was given.  If he remains vent dependent then  PCCM will assume his care.   Independent CC time 30 minutes   Levy Pupaobert Sonny Anthes, MD, PhD 11/06/2016, 9:31 AM [facility] [phone] or if no answer [phone]

## 2016-11-06 NOTE — Progress Notes (Signed)
PCCM INTERVAL PROGRESS NOTE  82 year old male admitted for stroke and required tracheostomy which was done 11/16. Yesterday evening he was having some oozing from around the trach site and thrombipads were applied without much improvement. 530 AM 11/24 he developed severe respiratory distress and hemoptysis coughing up several large clots/plugs from trach. He continued to bleed and so far has had 500cc bloody drainage from around, inside trach cannula. CXR reveals no pulmonary focus of bleeding.  Plan: Inflate cuff  Place on mandatory ventilation mode.  Check CBC and coags, transfuse as indicated. Will apply more thrombi-pads Will consider ENT consult if does not improve.   Joneen RoachPaul Hoffman, AGACNP-BC [facility] Pager [phone] or [phone]  11/06/2016 5:57 AM

## 2016-11-06 NOTE — Progress Notes (Signed)
Responded to this room following code pager alert, on arrival, patient's pulses were present, RN was bagging at bedside. Per RN patient was "barely agonal" and she began to bag patient. Bright, frank blood was copious and continuously suctioned via patient's trach, and multiple blood clots were suctioned. Clots also found around outside of inner cannula. ABG was collected and patient placed on ventilator due to increased work of breathing, and audible wheezing heard following respiratory arrest. RTs at bedside to await transfer to unit.

## 2016-11-06 NOTE — Progress Notes (Signed)
Per Dr. Delton CoombesByrum, do not change the inner cannula or do tracheal suctioning unless nessesary due to bleeding from the trach site. Trach site appears to be clotted at this time.

## 2016-11-06 NOTE — Progress Notes (Signed)
  [place] TEAM 1 - Stepdown/ICU TEAM  Pt encountered signif issues w/ bleeding from his trach yesterday evening, prompting PCCM to place him back on the vent and transfer him to the ICU.    All active issues today were addressed by PCCM.  Will keep on my service for now, but if remains on vent overnight will need to transition attending [facility].    [facility].   Lonia BloodJeffrey T. Chrysa Rampy, MD [facility] Office  [phone] Pager - Text Page per Amion as per below:  On-Call/Text Page:      Loretha [email or web address]      password [email or web address]  If 7PM-7AM, please contact night-coverage [email or web address] Password [email or web address] 11/06/2016, 6:19 PM

## 2016-11-06 NOTE — Progress Notes (Signed)
Nutrition Follow-up  DOCUMENTATION CODES:   Not applicable  INTERVENTION:   -Recommend resume Jevity 1.2 at 55 ml/h with Prostat 30 ml TID to provide 1884 kcal, 118 gm protein, 1069 ml free water daily.  NUTRITION DIAGNOSIS:   Inadequate oral intake related to inability to eat as evidenced by NPO status.  Ongoing  GOAL:   Patient will meet greater than or equal to 90% of their needs  Progressinf  MONITOR:   Labs, I & O's, TF tolerance  REASON FOR ASSESSMENT:   Ventilator Enteral/tube feeding initiation and management  ASSESSMENT:   Pt admitted with acute onset of aphasia and left hemiplegia with occluded RT ICA, RT MCA, and RT ACA with complete revasuclaraztion.  Patient on vent support via trach.  MV: 9.8 L/min Temp (24hrs), Avg:99.8 F (37.7 C), Min:98.8 F (37.1 C), Max:101 F (38.3 C)  Pt transferred to ICU due to rapid response as a result of bleeding from trach site (desaturating with blood clots) and respiratory distress. Per PCCM notes, plan to wean from vent with goal back to ATC.   Needs re-estimated due to change in status. Pt was transitioned to standard formula on 11/04/16, however, TF currently held at time of visit. Previous TF orders of Jevity 1.2 at 55 ml/h with Prostat 30 ml TID to provide 1884 kcal, 118 gm protein, 1069 ml free water daily will meet 100% of estimated kcal and protein needs.   Labs reviewed: CBGS: 189.   Diet Order:  Diet NPO time specified  Skin:  Reviewed, no issues  Last BM:  10/31/16  Height:   Ht Readings from Last 1 Encounters:  10/25/16 5\' 7"  (1.702 m)    Weight:   Wt Readings from Last 1 Encounters:  11/06/16 182 lb 5.1 oz (82.7 kg)    Ideal Body Weight:  80.9 kg  BMI:  Body mass index is 28.56 kg/m.  Estimated Nutritional Needs:   Kcal:  1891.2  Protein:  105-125 gm  Fluid:  2 L  EDUCATION NEEDS:   No education needs identified at this time  Nyara Capell A. Mayford KnifeWilliams, RD, LDN, CDE Pager:  [phone] After hours Pager: [phone]

## 2016-11-06 NOTE — Progress Notes (Addendum)
   [facility] PCCM  Patient started having blood clots per trach yesterday morning. Throughout the day, increase in the blood clots noted. Since midnight, patient has been having large blood clots around the trach. At the end of night shift, patient has filled half the canister with blood and blood clots and they have also repeatedly changed the dressing. Initially blood seemed to be controlled with Thrombi Pads but the last hour or so, he has more bleeding around the tracheostomy.  He was noted to be desaturating with blood clots. He was switched back on the ventilator. ABG has been reviewed.  Patient seen, awake, arousable. Neurologic exam is at baseline. Blood pressure stable. Heart rate in the 90s. Respiratory rate 20s. O2 sat 100% on 100% FiO2. Janina Mayorach has dressing around it which is soaked with blood. Breath sounds were clear. Good S1 and S2. Has left-sided weakness and right-sided facial weakness.  Assessment : Tracheostomy bleed in a pt taking Xarelto.  Acute Hypoxemic respiratory failure secondary to above. CVA  Plan : 1. Transfer to ICU. 2. Keep on the ventilator. Keep O2 saturation more than 90%. 3. Stat labs ordered. Stop xarelto.  4. Will prophylactically transfuse 1 unit packed red blood cells. Will have 2 units packed red blood cells ready. 5. Will give PCC/Kcentra. I discussed with pharmacy. They will order and will dose accordingly. 6. We'll need to reassess the next couple of hours if the bleeding subsides. If not, will need surgery/ENT/vascular evaln.  7. I updated patient's son regarding patient's condition. I mentioned that he is critically ill because of the bleeding. 8. CXR with no infiltrates seen. Will hold off on abx.   Critical care time spent on this patient today : 30 minutes.   Pollie MeyerJ. Angelo A de Dios, MD 11/06/2016, 6:54 AM [place] [place] [facility] Pager [phone] After 3 pm or if no answer, call [phone]

## 2016-11-06 NOTE — Significant Event (Signed)
Rapid Response Event Note This came out as a code blue Overview: Time Called: 0514 Arrival Time: 0516 Event Type: Respiratory  Initial Focused Assessment: On arrival RT at bedside suctioning pt's trach. RN reports she received a phone call from central monitoring for sats 50%. RT suctioned 2 large blood clots/plugs from trach. Pt had been oozing blood around trach through out the night. Pt had 500 cc bloody secretions from trach. Pt sats 100% on 70%.   Interventions: RT placed pt on vent. PCCM to bedside, new orders placed. Pt transferred to 4N19      Event Summary: Name of Physician Notified: Dr. Darrick Pennaeterding  at [phone]    at    Outcome: Transferred (Comment) ([id number])     Gerarda FractionSHULAR, Melina Mosteller Paige

## 2016-11-06 NOTE — Progress Notes (Signed)
Called to room at 0515 after being notified of low oxygen saturation levels and cardiac tachycardia.  Found patient with blood splatter over bedspread, clotting around trach, no respirations, and oxygen saturations in the 50's.  Suctioned and bagged.  Large 3/4 inch clot retrieved via suctioning by respiratory therapy.  Respirations resumed with stridor noted.  Dr Deterding in critical care notified.  She will send a MD to look at patient and address transfer.  Respiratory remains at bedside.  Saturation levels 100% on 70%.

## 2016-11-06 NOTE — Progress Notes (Signed)
Called to pt bedside by RN to examine trach site.  Pt was suctioned earlier in shift for moderate to copious amount of pink-tinged/bloody secretions, however the amount of frank blood oozing from in and around trach site has increased (30+ cc).  Site red underneath flange, sutures removed per RT protocol.  Patient's trach site was cleaned and new dressing applied.  Airway stable at this time, vitals wnl.  Pt remains in 28% atc, HR94, RR21, Spo2 96%.  Vitals have been wnl throughout shift.  RN has notified NP/CCM of this event.

## 2016-11-06 NOTE — Progress Notes (Signed)
2 attempts made to contact son [name] with numbers listed on chart.  No answer, no voicemail.  Will attempt again later.  Critical care at bedside, waiting on available ICU bed.

## 2016-11-06 NOTE — Progress Notes (Signed)
Active oozing of blood from stoma.  20-50 cc bright red sputum coughed up.  Oxygen sats 97%.  Suctioned small amount of remaining secretions from trach.  Annalissa from respiratory therapy called to examine site.  She removed the sutures, suctioned and cleaned the site. New dressings applied.  Airway stable at the moment.  NP on call notified of event and respiratory has requested that a MD come examine airway.

## 2016-11-07 DIAGNOSIS — D689 Coagulation defect, unspecified: Secondary | ICD-10-CM

## 2016-11-07 LAB — GLUCOSE, CAPILLARY
GLUCOSE-CAPILLARY: 126 mg/dL — AB (ref 65–99)
GLUCOSE-CAPILLARY: 125 mg/dL — AB (ref 65–99)
GLUCOSE-CAPILLARY: 145 mg/dL — AB (ref 65–99)
Glucose-Capillary: 158 mg/dL — ABNORMAL HIGH (ref 65–99)
Glucose-Capillary: 139 mg/dL — ABNORMAL HIGH (ref 65–99)
Glucose-Capillary: 138 mg/dL — ABNORMAL HIGH (ref 65–99)
Glucose-Capillary: 134 mg/dL — ABNORMAL HIGH (ref 65–99)
Glucose-Capillary: 128 mg/dL — ABNORMAL HIGH (ref 65–99)

## 2016-11-07 LAB — HEMOGLOBIN AND HEMATOCRIT, BLOOD
HEMATOCRIT: 34.2 % — AB (ref 39.0–52.0)
HEMOGLOBIN: 11.2 g/dL — AB (ref 13.0–17.0)

## 2016-11-07 NOTE — Plan of Care (Signed)
Problem: Education: Goal: Knowledge of patient specific risk factors addressed and post discharge goals established will improve Outcome: Progressing Discussed risk factors with pt's wife and son.  May need reinforcement at dc.

## 2016-11-07 NOTE — Progress Notes (Signed)
Name: Sean [name] MRN: [id number] DOB: October 23, 1926    ADMISSION DATE:  10/23/2016 CONSULTATION DATE:  10/23/2016  REFERRING MD :  Ranae PalmsYelverton, M.D.  CHIEF COMPLAINT:  Acute onset of left hemiplegia and aphasia  BRIEF PATIENT DESCRIPTION: 82 y.o. admitted for stroke and required tracheostomy which was done 11/16.   SIGNIFICANT EVENTS:  11/10 - Admit 11/16 - S/P Tracheostomy by Ninetta LightsJY 11/24 - Oozing around tracheostomy on 11/24 on systemic anticoagulation  LINES/TUBES: ET tube 11/10 - 11/16 Left Radial Aline 11/10 - out R Femoral Sheath 11/10 - 11/11 Trach 11/16>> PEG 11/16>>  SUBJECTIVE:  Patient with bleeding around tracheostomy yesterday. Thrombi pads were applied. Anticoagulation was stopped and patient was ordered Kcentra but was not given as bleeding tapered. Nurse confirms minimal to no bleeding from around trach. Remained on ventilator all night.   REVIEW OF SYSTEMS:  Unable to obtain with patient on ventilator.  VITAL SIGNS: Temp:  [97.7 F (36.5 C)-100.7 F (38.2 C)] 97.7 F (36.5 C) (11/25 0844) Pulse Rate:  [75-94] 85 (11/25 0851) Resp:  [13-24] 13 (11/25 0851) BP: (91-128)/(48-76) 118/65 (11/25 0851) SpO2:  [91 %-100 %] 100 % (11/25 0851) FiO2 (%):  [40 %-60 %] 40 % (11/25 0851) Weight:  [167 lb 12.3 oz (76.1 kg)] 167 lb 12.3 oz (76.1 kg) (11/25 0418)  PHYSICAL EXAMINATION: General:  Eyes closed. No distress. Wife at bedside. Neuro:  Spontaneously moving right arm & leg. Doesn't open eyes to voice.  HEENT:  Tracheostomy in place. Old blood around tracheostomy. No scleral icterus. Cardiovascular:  Regular rate. No edema. Pulmonary:  Remains on ventilator. Symmetric chest rise. Minimal bloody secretions in tubing. Abdomen:  Soft. Nondistended. Normal bowel sounds. Integument:  Warm & dry. No rash on exposed skin.    Recent Labs Lab 11/04/16 0550 11/05/16 0537 11/06/16 0831  NA 137 139 137  K 3.5 3.6 4.3  CL 101 102 101  CO2 25 28 27   BUN 19 23* 28*    CREATININE 0.64 0.72 0.82  GLUCOSE 165* 166* 182*    Recent Labs Lab 11/05/16 0537 11/06/16 0519 11/06/16 0831 11/06/16 1224 11/07/16 0738  HGB 10.1* 11.1* 9.9* 9.9* 11.2*  HCT 31.1* 34.8* 31.2* 30.1* 34.2*  WBC 8.9 14.7* 13.2*  --   --   PLT 306 396 353  --   --    Dg Chest Port 1 View  Result Date: 11/06/2016 CLINICAL DATA:  Bleeding around the tracheostomy site. History of hypertension. EXAM: PORTABLE CHEST 1 VIEW COMPARISON:  11/04/2016 FINDINGS: Mild cardiac enlargement. No pulmonary vascular congestion or edema. No blunting of costophrenic angles. No pneumothorax. Calcification of the aorta. Mediastinal contours appear intact. Tracheostomy with tip measuring 8 cm above the carina. IMPRESSION: Tracheostomy appears in place. Mild cardiac enlargement. No evidence of active pulmonary disease. Electronically Signed   By: Burman NievesWilliam  Stevens M.D.   On: 11/06/2016 05:44    ASSESSMENT / PLAN:  82 y.o. male s/p tracheostomy with bleeding around tracheostomy on systemic anticoagulation. Vitals remain stable & patient's oxygen requirement is only FiO2 0.4. Hopefully will transition back to tracheostomy collar. Continue close monitoring for further bleeding from tracheostomy site and worsening respiratory status.   1. Bleeding:  Secondary to anticoagulation. Improved. 2. Right MCA CVA:  Due to right ICA occlusion. S/P mechanical thrombectomy. Holding anticoagulation with bleeding. 3. Acute Hypoxic Respiratory Failure:  Secondary to CVA and now acute bleeding. S/P Tracheostomy. Attempt to transition back to [employer] Collar today and wean FiO2 for Sat >90%.  4.  Anemia:  S/P 1u PRBC on 11/24. Trending cell counts daily w/ CBC. Transfuse for active bleeding or Hgb <7.0. 5. Leukocytosis:  Mild. Likely reactive.  Trending cell counts daily w/ CBC. 6. Coagulopathy:  Secondary to Xarelto. Holding anticoagulation given bleeding. 7. Chronic Atrial Fibrillation: Heart rate controlled. Continuing telemetry  monitoring. 8. Hyperlipidemia:  LDL 53. Not currently on treatment. Reassess as an outpatient. 9. Hypertension:  BP controlled. Vitals per unit protocol. Continuing Hydralazine 50mg  VT q8hr. 10. Dysphagia:  Secondary to CVA. S/P PEG. 11. Thrombocytopenia:  Resolved. 12. Hypokalemia:  Resolved. 13. Diet:  Continuing Tube Feedings.  14. Prophylaxis:  SCDs & Protonix VT daily. 15. S/P Tracheostomy:  Holding on deep suctioning.   I have spent a total of 31 minutes of critical care time today caring for the patient and reviewing the patient's electronic medical record.   Donna ChristenJennings E. Jamison NeighborNestor, M.D. [facility] Pager:  [phone] After 3pm or if no response, call [phone] 11/07/2016, 10:14 AM

## 2016-11-08 DIAGNOSIS — L03818 Cellulitis of other sites: Secondary | ICD-10-CM

## 2016-11-08 LAB — MAGNESIUM: Magnesium: 2.3 mg/dL (ref 1.7–2.4)

## 2016-11-08 LAB — RENAL FUNCTION PANEL
ANION GAP: 10 (ref 5–15)
Albumin: 2 g/dL — ABNORMAL LOW (ref 3.5–5.0)
BUN: 30 mg/dL — AB (ref 6–20)
CALCIUM: 7.5 mg/dL — AB (ref 8.9–10.3)
CO2: 23 mmol/L (ref 22–32)
Chloride: 106 mmol/L (ref 101–111)
Creatinine, Ser: 0.58 mg/dL — ABNORMAL LOW (ref 0.61–1.24)
GFR calc Af Amer: >60 mL/min (ref >60)
GFR calc non Af Amer: >60 mL/min (ref >60)
GLUCOSE: 167 mg/dL — AB (ref 65–99)
Phosphorus: 3.7 mg/dL (ref 2.5–4.6)
Potassium: 3.5 mmol/L (ref 3.5–5.1)
SODIUM: 139 mmol/L (ref 135–145)

## 2016-11-08 LAB — CBC WITH DIFFERENTIAL/PLATELET
Basophils Absolute: 0 10*3/uL (ref 0.0–0.1)
Basophils Relative: 0 %
EOS ABS: 0.2 10*3/uL (ref 0.0–0.7)
EOS PCT: 2 %
HCT: 28.7 % — ABNORMAL LOW (ref 39.0–52.0)
HEMOGLOBIN: 9.3 g/dL — AB (ref 13.0–17.0)
LYMPHS ABS: 0.8 10*3/uL (ref 0.7–4.0)
Lymphocytes Relative: 9 %
MCH: 28.6 pg (ref 26.0–34.0)
MCHC: 32.4 g/dL (ref 30.0–36.0)
MCV: 88.3 fL (ref 78.0–100.0)
MONO ABS: 0.6 10*3/uL (ref 0.1–1.0)
MONOS PCT: 7 %
Neutro Abs: 7.5 10*3/uL (ref 1.7–7.7)
Neutrophils Relative %: 82 %
PLATELETS: 344 10*3/uL (ref 150–400)
RBC: 3.25 MIL/uL — ABNORMAL LOW (ref 4.22–5.81)
RDW: 15.3 % (ref 11.5–15.5)
WBC: 9.1 10*3/uL (ref 4.0–10.5)

## 2016-11-08 LAB — GLUCOSE, CAPILLARY
GLUCOSE-CAPILLARY: 187 mg/dL — AB (ref 65–99)
GLUCOSE-CAPILLARY: 197 mg/dL — AB (ref 65–99)
GLUCOSE-CAPILLARY: 219 mg/dL — AB (ref 65–99)
Glucose-Capillary: 139 mg/dL — ABNORMAL HIGH (ref 65–99)
Glucose-Capillary: 154 mg/dL — ABNORMAL HIGH (ref 65–99)
Glucose-Capillary: 186 mg/dL — ABNORMAL HIGH (ref 65–99)

## 2016-11-08 MED ORDER — SENNOSIDES 8.8 MG/5ML PO SYRP
5.0000 mL | ORAL_SOLUTION | Freq: Two times a day (BID) | ORAL | Status: DC
  Administered 2016-11-08 – 2016-11-20 (×22): 5 mL
  Filled 2016-11-08 (×26): qty 5

## 2016-11-08 MED ORDER — VANCOMYCIN HCL 10 G IV SOLR
1250.0000 mg | INTRAVENOUS | Status: DC
  Administered 2016-11-08 – 2016-11-10 (×3): 1250 mg via INTRAVENOUS
  Filled 2016-11-08 (×4): qty 1250

## 2016-11-08 NOTE — Progress Notes (Signed)
Name: Sean [name] MRN: [id number] DOB: 1926/01/22    ADMISSION DATE:  10/23/2016 CONSULTATION DATE:  10/23/2016  REFERRING MD :  Ranae PalmsYelverton, M.D.  CHIEF COMPLAINT:  Acute onset of left hemiplegia and aphasia  BRIEF PATIENT DESCRIPTION: 82 y.o. admitted for stroke and required tracheostomy which was done 11/16.   SIGNIFICANT EVENTS:  11/10 - Admit 11/16 - S/P Tracheostomy by Ninetta LightsJY 11/24 - Oozing around tracheostomy on 11/24 on systemic anticoagulation  LINES/TUBES: ET tube 11/10 - 11/16 Left Radial Aline 11/10 - out R Femoral Sheath 11/10 - 11/11 Trach 11/16>> PEG 11/16>>  SUBJECTIVE:  Nurse reports more discharge from around tracheostomy and intermittent agitation attempting to pull tracheostomy out.   REVIEW OF SYSTEMS:  Unable to obtain with patient with altered mentation & on ventilator.  VITAL SIGNS: Temp:  [97.6 F (36.4 C)-99.1 F (37.3 C)] 98.6 F (37 C) (11/26 0911) Pulse Rate:  [65-90] 82 (11/26 0806) Resp:  [12-22] 16 (11/26 0806) BP: (91-136)/(54-92) 105/61 (11/26 0806) SpO2:  [91 %-100 %] 98 % (11/26 0806) FiO2 (%):  [40 %] 40 % (11/26 0806) Weight:  [164 lb 0.4 oz (74.4 kg)] 164 lb 0.4 oz (74.4 kg) (11/26 0400)  PHYSICAL EXAMINATION: General:  Eyes closed. No distress. No family at bedside. Neuro:  Spontaneously moving extremities. Opens left eye to voice.  HEENT:  Tracheostomy in place. Purulent drainage around tracheostomy. Cardiovascular:  Regular rate. No edema. Pulmonary:  Remains on ventilator. Symmetric chest rise.  Abdomen:  Soft. Nondistended. Normal bowel sounds. Integument:  Warm & dry. No rash on exposed skin.    Recent Labs Lab 11/05/16 0537 11/06/16 0831 11/08/16 0420  NA 139 137 139  K 3.6 4.3 3.5  CL 102 101 106  CO2 28 27 23   BUN 23* 28* 30*  CREATININE 0.72 0.82 0.58*  GLUCOSE 166* 182* 167*    Recent Labs Lab 11/06/16 0519 11/06/16 0831 11/06/16 1224 11/07/16 0738 11/08/16 0400  HGB 11.1* 9.9* 9.9* 11.2* 9.3*  HCT  34.8* 31.2* 30.1* 34.2* 28.7*  WBC 14.7* 13.2*  --   --  9.1  PLT 396 353  --   --  344   No results found.  ASSESSMENT / PLAN:  82 y.o. male s/p tracheostomy with bleeding around tracheostomy on systemic anticoagulation. Increased purulent drainage around tracheostomy site concerning. Starting empiric antibiotics. Continuing to monitor closely. Hopefully will be able to transition to tracheostomy collar again soon. No family at bedside this morning.   1. Bleeding:  Secondary to anticoagulation. Resolved. 2. Right MCA CVA:  Due to right ICA occlusion. S/P mechanical thrombectomy. Holding anticoagulation with bleeding. 3. Acute Hypoxic Respiratory Failure:  Secondary to CVA and now acute bleeding. S/P Tracheostomy. Attempt to transition back to [employer]ach Collar today and wean FiO2 for Sat >90%.  4. Cellulitis:  At tracheostomy insertion site. Starting empiric vancomycin. Wound care consulted. 5. Acute Encephalopathy/Delirium:  Checking EKG to evaluate QTc. Considering Precedex versus Haldol IV for delirium.  6. Anemia:  S/P 1u PRBC on 11/24. Hgb stable. Trending cell counts daily w/ CBC. Transfuse for active bleeding or Hgb <7.0. 7. Leukocytosis:  Resolved. Likely reactive.  Trending cell counts daily w/ CBC. 8. Coagulopathy:  Secondary to Xarelto. Holding anticoagulation given bleeding. 9. Chronic Atrial Fibrillation: Heart rate controlled. Continuing telemetry monitoring. 10. Prolonged QTc:  Checking EKG today. 11. Constipation:  Starting Senna via tube bid. 12. Hyperlipidemia:  LDL 53. Not currently on treatment. Reassess as an outpatient. 13. Hypertension:  BP controlled. Vitals per  unit protocol. Continuing Hydralazine 50mg  VT q8hr. 14. Dysphagia:  Secondary to CVA. S/P PEG. 15. Thrombocytopenia:  Resolved. 16. Hypokalemia:  Resolved. 17. Diet:  Continuing Tube Feedings.  18. Prophylaxis:  SCDs & Protonix VT daily. 19. S/P Tracheostomy:  Holding on deep suctioning.   I have spent a total  of 33 minutes of critical care time today caring for the patient and reviewing the patient's electronic medical record.   Donna ChristenJennings E. Jamison NeighborNestor, M.D. [facility] Pager:  [phone] After 3pm or if no response, call [phone] 11/08/2016, 10:07 AM

## 2016-11-08 NOTE — Progress Notes (Signed)
Patient pulling at trach and coming off of the vent. Patient agitated and restless. ELink MD called and notified. Order to initiate soft right wrist restraint.

## 2016-11-08 NOTE — Progress Notes (Signed)
Pharmacy Antibiotic Note  Sean [name] is a 82 y.o. male admitted on 10/23/2016 with concern for cellulitis and increased purulent drainage around tracheostomy site.  Pharmacy has been consulted for vancomycin dosing. Renal function is stable, estimated CrCl ~60 ml/min. Goal vancomycin trough is 10 - 15 mcg/mL.  Plan: - Vancomycin 1250 mg IV q24h - Monitor C&S, CBC and clinical progression - Monitor renal function and vancomycin trough at steady state  Height: 5\' 7"  (170.2 cm) Weight: 164 lb 0.4 oz (74.4 kg) IBW/kg (Calculated) : 66.1  Temp (24hrs), Avg:98.4 F (36.9 C), Min:97.6 F (36.4 C), Max:99.1 F (37.3 C)   Recent Labs Lab 11/02/16 0310 11/04/16 0550 11/05/16 0537 11/06/16 0519 11/06/16 0831 11/08/16 0400 11/08/16 0420  WBC 10.3 9.6 8.9 14.7* 13.2* 9.1  --   CREATININE 0.57* 0.64 0.72  --  0.82  --  0.58*    Estimated Creatinine Clearance: 59.7 mL/min (by C-G formula based on SCr of 0.58 mg/dL (L)).    No Known Allergies  Antimicrobials this admission: Ceftriaxone 11/18>>11/22 Vancomycin 11/26 >>  Microbiology Results: 11/10 mrsa pcr: neg 11/16 UCx: Neg 11/16 BCx: neg 11/7 resp cx: normal resp flora 11/19 resp cx: normal resp flora  Thank you for allowing pharmacy to be a part of this patient's care.  Casilda Carlsaylor Analyssa Downs, PharmD. PGY-2 Infectious Diseases Pharmacy Resident Pager: [phone] 11/08/2016 10:44 AM

## 2016-11-09 LAB — RENAL FUNCTION PANEL
ALBUMIN: 2.2 g/dL — AB (ref 3.5–5.0)
Anion gap: 10 (ref 5–15)
BUN: 25 mg/dL — AB (ref 6–20)
CHLORIDE: 106 mmol/L (ref 101–111)
CO2: 23 mmol/L (ref 22–32)
CREATININE: 0.56 mg/dL — AB (ref 0.61–1.24)
Calcium: 8 mg/dL — ABNORMAL LOW (ref 8.9–10.3)
Glucose, Bld: 176 mg/dL — ABNORMAL HIGH (ref 65–99)
PHOSPHORUS: 4.2 mg/dL (ref 2.5–4.6)
Potassium: 3.5 mmol/L (ref 3.5–5.1)
Sodium: 139 mmol/L (ref 135–145)

## 2016-11-09 LAB — CBC WITH DIFFERENTIAL/PLATELET
BASOS PCT: 1 %
Basophils Absolute: 0.1 10*3/uL (ref 0.0–0.1)
EOS ABS: 0.3 10*3/uL (ref 0.0–0.7)
EOS PCT: 4 %
HCT: 29.5 % — ABNORMAL LOW (ref 39.0–52.0)
HEMOGLOBIN: 9.7 g/dL — AB (ref 13.0–17.0)
LYMPHS PCT: 13 %
Lymphs Abs: 1.1 10*3/uL (ref 0.7–4.0)
MCH: 29.3 pg (ref 26.0–34.0)
MCHC: 32.9 g/dL (ref 30.0–36.0)
MCV: 89.1 fL (ref 78.0–100.0)
MONO ABS: 0.7 10*3/uL (ref 0.1–1.0)
Monocytes Relative: 8 %
NEUTROS PCT: 74 %
Neutro Abs: 6.1 10*3/uL (ref 1.7–7.7)
PLATELETS: 357 10*3/uL (ref 150–400)
RBC: 3.31 MIL/uL — AB (ref 4.22–5.81)
RDW: 15.5 % (ref 11.5–15.5)
WBC: 8.3 10*3/uL (ref 4.0–10.5)

## 2016-11-09 LAB — GLUCOSE, CAPILLARY
GLUCOSE-CAPILLARY: 141 mg/dL — AB (ref 65–99)
GLUCOSE-CAPILLARY: 176 mg/dL — AB (ref 65–99)
GLUCOSE-CAPILLARY: 178 mg/dL — AB (ref 65–99)
GLUCOSE-CAPILLARY: 218 mg/dL — AB (ref 65–99)
Glucose-Capillary: 190 mg/dL — ABNORMAL HIGH (ref 65–99)

## 2016-11-09 LAB — MAGNESIUM: MAGNESIUM: 2.4 mg/dL (ref 1.7–2.4)

## 2016-11-09 MED ORDER — HALOPERIDOL LACTATE 5 MG/ML IJ SOLN
1.0000 mg | INTRAMUSCULAR | Status: DC | PRN
  Administered 2016-11-18 – 2016-11-19 (×2): 2 mg via INTRAVENOUS
  Filled 2016-11-09 (×2): qty 1

## 2016-11-09 MED ORDER — SODIUM CHLORIDE 0.9 % IV SOLN
INTRAVENOUS | Status: DC
  Administered 2016-11-11 – 2016-11-18 (×4): via INTRAVENOUS

## 2016-11-09 NOTE — Progress Notes (Signed)
Name: Sean [name] MRN: [id number] DOB: 01-19-1926    ADMISSION DATE:  10/23/2016 CONSULTATION DATE:  10/23/2016  REFERRING MD :  Ranae PalmsYelverton, M.D.  CHIEF COMPLAINT:  Acute onset of left hemiplegia and aphasia  BRIEF PATIENT DESCRIPTION: 82 y.o. admitted for stroke and required tracheostomy which was done 11/16.   SIGNIFICANT EVENTS:  11/10 - Admit 11/16 - S/P Tracheostomy by Ninetta LightsJY 11/24 - Oozing around tracheostomy on 11/24 on systemic anticoagulation  LINES/TUBES: ET tube 11/10 - 11/16 Left Radial Aline 11/10 - out R Femoral Sheath 11/10 - 11/11 Trach 11/16>> PEG 11/16>>  SUBJECTIVE:  RN reports intermittent agitation attempting to pull tracheostomy out.  No more bleeding afebrile    VITAL SIGNS: Temp:  [97.3 F (36.3 C)-98.7 F (37.1 C)] 98.1 F (36.7 C) (11/27 0856) Pulse Rate:  [57-87] 80 (11/27 0800) Resp:  [10-21] 21 (11/27 0800) BP: (100-129)/(53-71) 118/66 (11/27 0800) SpO2:  [93 %-100 %] 100 % (11/27 0836) FiO2 (%):  [40 %] 40 % (11/27 0836) Weight:  [179 lb 3.7 oz (81.3 kg)] 179 lb 3.7 oz (81.3 kg) (11/27 0400)  PHYSICAL EXAMINATION: General:  Eyes closed. No distress. No family at bedside. Neuro:  Spontaneously moving extremities. Opens left eye to voice.  HEENT:  Tracheostomy in place. Purulent drainage around tracheostomy. Cardiovascular:  Regular rate. No edema. Pulmonary:  Remains on ventilator. Symmetric chest rise.  Abdomen:  Soft. Nondistended. Normal bowel sounds. Integument:  Warm & dry. No rash on exposed skin.    Recent Labs Lab 11/06/16 0831 11/08/16 0420 11/09/16 0403  NA 137 139 139  K 4.3 3.5 3.5  CL 101 106 106  CO2 27 23 23   BUN 28* 30* 25*  CREATININE 0.82 0.58* 0.56*  GLUCOSE 182* 167* 176*    Recent Labs Lab 11/06/16 0831  11/07/16 0738 11/08/16 0400 11/09/16 0403  HGB 9.9*  < > 11.2* 9.3* 9.7*  HCT 31.2*  < > 34.2* 28.7* 29.5*  WBC 13.2*  --   --  9.1 8.3  PLT 353  --   --  344 357  < > = values in this interval  not displayed. No results found.  ASSESSMENT / PLAN:  82 y.o. male s/p tracheostomy with bleeding around tracheostomy on systemic anticoagulation. Increased purulent drainage around tracheostomy site concerning.    1. Bleeding:  Secondary to anticoagulation. Resolved. 2. Right MCA CVA:  Due to right ICA occlusion. S/P mechanical thrombectomy. Holding anticoagulation with bleeding. 3. Acute Hypoxic Respiratory Failure:  Secondary to CVA and now acute bleeding. S/P Tracheostomy. Attempt to transition back to [employer] Collar  4. Cellulitis:  At tracheostomy insertion site. ct empiric vancomycin. Wound care consulted. 5. Acute Encephalopathy/Delirium:  fent prn pain, Haldol IV prn for delirium.  6. Anemia:  S/P 1u PRBC on 11/24. Hgb stable. Trending cell counts daily w/ CBC. Transfuse for active bleeding or Hgb <7.0. 7. Leukocytosis:  Resolved. Likely reactive.  8. Coagulopathy:  Secondary to Xarelto. Holding anticoagulation given bleeding. 9. Chronic Atrial Fibrillation: Heart rate controlled. Continuing telemetry monitoring. 10. Prolonged QTc:  482 on 11/26 11. Constipation:  Starting Senna via tube bid. 12. Hyperlipidemia:  LDL 53. Not currently on treatment. Reassess as an outpatient. 13. Hypertension:  BP controlled. Vitals per unit protocol. Continuing Hydralazine 50mg  VT q8hr. 14. Dysphagia:  Secondary to CVA. S/P PEG. 15. Thrombocytopenia:  Resolved. 16. Hypokalemia:  Resolved. 17. Diet:  Continuing Tube Feedings.  18. Prophylaxis:  SCDs & Protonix VT daily.  Transfer to SDU  Xcel Energyakesh Alva  MD. FCCP. [facility] Pager [phone] If no response call [phone]    11/09/2016, 9:14 AM

## 2016-11-09 NOTE — Progress Notes (Signed)
Unable to place order for restraints due to urgent patient care needs. Computer will not allow order placement past 1 hour previous. Notified charge who directed RN to place note concerning order.

## 2016-11-09 NOTE — Consult Note (Addendum)
WOC Nurse wound consult note Reason for Consult: trach site Wound type:partial thickness skin loss around stoma Measurement: unable to measure Wound DGU:YQIHbed:pink, moist Drainage (amount, consistency, odor) copious trach secretions around tube Feel that skin is breaking down from the moisture, will add super absorbant trach split gauze for a few days to see if this will improve skin status Periwound: intact Dressing procedure/placement/frequency: Drawtech split gauze to absorb excess drainage. WOC to provide to patient's room for use.  Will reassess in a few days to see if skin improved.   Will place patient on low air loss mattress for high risk of skin breakdown and it is noted that patient has area on the sacrum that I will discuss further with the bedside nurse.   Discussed POC with bedside nurse.  Re consult if needed, will not follow at this time. Thanks  Sean [name] M.[employer] MSN, RN,CWOCN, CNS [phone])  Addendum:  Patient with sacral dressing in place. Noted 11/07/16 pressure injury documented.  WOC assessment today Wound type: Deep tissue pressure injury Pressure Ulcer POA: No Measurement: 7cm x 5cm x 0 Wound bed: dark purple, intact skin with some blistering Drainage (amount, consistency, odor) none Periwound: intact  Dressing procedure/placement/frequency: Continue foam dressing, staff to assess under dressing each shift for acute changes.   Sean [name] CWOCN

## 2016-11-09 NOTE — Progress Notes (Signed)
Physical Therapy Treatment Patient Details Name: Sean AbedKong Becker MRN: [id number] DOB: [birth year] Today's Date: 11/09/2016    History of Present Illness Sean [name] is a 82 y.o. male admitted with He has expressive aphasia and Not moving left side with right-sided gaze deviation. MRI: Areas of acute infarction in the right MCA territory involving the insula, frontal operculum, anteromedial temporal lobe, and lateral temporal parietal junction s/p revascularization.    PT Comments    Patient seen in conjunction with OT therapist for activity progression. Tolerated EOb with total assist, VSS on ATC. Patient with minimal active movement noted in LLE and demonstrates behaviors consistent with pushers activity. Poor proprioceptive awareness at this time. Attempted functional tasks with hand over hand assist, patient with some ability to carry out but continues to required max to total assist at this time. SNF remains appropriate.  Follow Up Recommendations  SNF     Equipment Recommendations  None recommended by PT    Recommendations for Other Services OT consult;Rehab consult     Precautions / Restrictions Precautions Precautions: Fall Precaution Comments: trach, peg, condom cath Restrictions Weight Bearing Restrictions: No    Mobility  Bed Mobility Overal bed mobility: Needs Assistance;+2 for physical assistance Bed Mobility: Rolling;Supine to Sit;Sit to Supine     Supine to sit: Total assist;+2 for physical assistance Sit to supine: Total assist;+2 for physical assistance   General bed mobility comments: pt does not A with bed mobility.    Transfers Overall transfer level: Needs assistance   Transfers: Sit to/from Stand Sit to Stand: Total assist;+2 physical assistance;+2 safety/equipment         General transfer comment: Pt unable to clear bottom off of bed  Ambulation/Gait                 Stairs            Wheelchair Mobility    Modified Rankin (Stroke  Patients Only)       Balance Overall balance assessment: Needs assistance Sitting-balance support: Feet supported;Single extremity supported   Sitting balance - Comments: initally pt total A for static sitting balance due to pushing with RUE pt progressed to Max A (no pushing). Worked on a couple of assisted head turns to the left while sitting EOB, tracking an object coming down on left forearm to see reaction (pt reaching/holding on tightly to bed with RUE). When down on left forarm pt able to intiate coming back up to sit EOB. While sitting EOB pt demonstrates decreased proprioception/body awareness in space (pt pushing heavily with RUE unless you have his RUE engaged in something else. Postural control: Posterior lean (pushing posteriorly and to left)                          Cognition Arousal/Alertness: Awake/alert Behavior During Therapy: Flat affect Overall Cognitive Status: Difficult to assess Area of Impairment: Following commands;Safety/judgement;Problem solving       Following Commands: Follows one step commands inconsistently;Follows one step commands with increased time Safety/Judgement: Decreased awareness of safety;Decreased awareness of deficits   Problem Solving: Slow processing;Decreased initiation;Requires verbal cues;Requires tactile cues      Exercises Other Exercises Other Exercises: Pt demonstrated minimal movement of LUE (finger extension and shoulder extension) when I told him that I needed to put his splint back on his LUE. Splint re-applied and LUE propped up on a pillow for edema control    General Comments        Pertinent  Vitals/Pain Pain Assessment: Faces Faces Pain Scale: Hurts even more Pain Location: with head/neck rotation to the left Pain Descriptors / Indicators: Grimacing;Restless (trying to push therapist away with mitted RUE) Pain Intervention(s): Monitored during session;Repositioned    Home Living                       Prior Function            PT Goals (current goals can now be found in the care plan section) Acute Rehab PT Goals PT Goal Formulation: Patient unable to participate in goal setting Time For Goal Achievement: 11/09/16 Potential to Achieve Goals: Fair Progress towards PT goals: Progressing toward goals    Frequency    Min 3X/week      PT Plan Current plan remains appropriate    Co-evaluation   Reason for Co-Treatment: Complexity of the patient's impairments (multi-system involvement);For patient/therapist safety         End of Session Equipment Utilized During Treatment: Other (comment) (trach collar) Activity Tolerance: Patient tolerated treatment well Patient left: in bed;with call bell/phone within reach;with bed alarm set     Time: 1322-1406 PT Time Calculation (min) (ACUTE ONLY): 44 min  Charges:  $Therapeutic Activity: 8-22 mins                    G Codes:      Fabio AsaDevon J Jamarri Vuncannon 11/09/2016, 5:04 PM Charlotte Crumbevon Rashiya Lofland, PT DPT  [phone]

## 2016-11-09 NOTE — Progress Notes (Signed)
Patient pulling at trach and ventilator hose. Patient is agitated. Have tried redirecting. Notified Dr. Jamison NeighborNestor and have been given order to restart soft right wrist restraint. Will remove later if able.

## 2016-11-09 NOTE — Progress Notes (Signed)
Occupational Therapy Treatment Patient Details Name: Sean [name] MRN: [id number] DOB: 1926/10/14 Today's Date: 11/09/2016    History of present illness Sean [name] is a 82 y.o. male admitted with He has expressive aphasia and Not moving left side with right-sided gaze deviation. MRI: Areas of acute infarction in the right MCA territory involving the insula, frontal operculum, anteromedial temporal lobe, and lateral temporal parietal junction s/p revascularization.   OT comments  This 82 yo male admitted with above presents to acute OT today making progress with visually following objects and grooming. He will continue to benefit from acute OT with follow up at SNF.  Follow Up Recommendations  SNF;Supervision/Assistance - 24 hour    Equipment Recommendations  Other (comment) (TBD at next venue)       Precautions / Restrictions Precautions Precautions: Fall Precaution Comments: trach, peg, condom cath Restrictions Weight Bearing Restrictions: No       Mobility Bed Mobility Overal bed mobility: Needs Assistance;+2 for physical assistance Bed Mobility: Rolling;Supine to Sit;Sit to Supine     Supine to sit: Total assist;+2 for physical assistance Sit to supine: Total assist;+2 for physical assistance   General bed mobility comments: pt does not A with bed mobility.    Transfers Overall transfer level: Needs assistance   Transfers: Sit to/from Stand Sit to Stand: Total assist;+2 physical assistance;+2 safety/equipment         General transfer comment: Pt unable to clear bottom off of bed    Balance Overall balance assessment: Needs assistance Sitting-balance support: Feet supported;Single extremity supported   Sitting balance - Comments: initally pt total A for static sitting balance due to pushing with RUE pt progressed to Max A (no pushing). Worked on a couple of assisted head turns to the left while sitting EOB, tracking an object coming down on left forearm to see  reaction (pt reaching/holding on tightly to bed with RUE). When down on left forarm pt able to intiate coming back up to sit EOB. While sitting EOB pt demonstrates decreased proprioception/body awareness in space (pt pushing heavily with RUE unless you have his RUE engaged in something else. Postural control: Posterior lean (pushing posteriorly and to left)                         ADL Overall ADL's : Needs assistance/impaired     Grooming: Brushing hair Grooming Details (indicate cue type and reason): When  helped to initiate combing hair with hand over hand A pt then started doing it all on his own with RUE but only right side of head; when attempting hand over hand for him to comb the left hand side of his head with RUe he resisted                               General ADL Comments: Pt sat EOB with total A while his hair was washed      Vision                 Additional Comments: Pt continues with right gaze prefernce and Rt eye ptosis; some nystagmus noted as pt able to bring left eye to midline following object in front of him          Cognition   Behavior During Therapy: Flat affect Overall Cognitive Status: Difficult to assess Area of Impairment: Following commands;Safety/judgement;Problem solving        Following Commands:  Follows one step commands inconsistently;Follows one step commands with increased time Safety/Judgement: Decreased awareness of safety;Decreased awareness of deficits   Problem Solving: Slow processing;Decreased initiation;Requires verbal cues;Requires tactile cues        Exercises Other Exercises Other Exercises: Pt demonstrated minimal movement of LUE (finger extension and shoulder extension) when I told him that I needed to put his splint back on his LUE. Splint re-applied and LUE propped up on a pillow for edema control           Pertinent Vitals/ Pain       Pain Assessment: Faces Faces Pain Scale: Hurts even  more Pain Location: with head/neck rotation to the left Pain Descriptors / Indicators: Grimacing;Restless (trying to push therapist away with mitted RUE) Pain Intervention(s): Monitored during session;Repositioned         Frequency  Min 2X/week        Progress Toward Goals  OT Goals(current goals can now be found in the care plan section)  Progress towards OT goals: Progressing toward goals     Plan Discharge plan remains appropriate    Co-evaluation    PT/OT/SLP Co-Evaluation/Treatment: Yes Reason for Co-Treatment: Complexity of the patient's impairments (multi-system involvement);For patient/therapist safety             Activity Tolerance Patient tolerated treatment well   Patient Left in bed;with call bell/phone within reach;with bed alarm set;with restraints reapplied (right wrist and mitt restraints)   Nurse Communication  (nurse in room A'in us prn)        Time: 1322-1406 OT Time Calculation (min): 44 min  Charges: OT General Charges $OT Visit: 1 Procedure OT Treatments $Therapeutic Activity: 23-37 mins  Evette GeorgesLeonard, Zyanna Leisinger Eva [phone] 11/09/2016, 2:22 PM

## 2016-11-10 ENCOUNTER — Encounter (HOSPITAL_COMMUNITY): Payer: Self-pay | Admitting: *Deleted

## 2016-11-10 DIAGNOSIS — L8990 Pressure ulcer of unspecified site, unspecified stage: Secondary | ICD-10-CM | POA: Clinically undetermined

## 2016-11-10 LAB — CBC WITH DIFFERENTIAL/PLATELET
BASOS ABS: 0 10*3/uL (ref 0.0–0.1)
BASOS ABS: 0 10*3/uL (ref 0.0–0.1)
BASOS PCT: 0 %
BASOS PCT: 0 %
EOS PCT: 2 %
Eosinophils Absolute: 0.1 10*3/uL (ref 0.0–0.7)
Eosinophils Absolute: 0.1 10*3/uL (ref 0.0–0.7)
Eosinophils Relative: 2 %
HCT: 27.8 % — ABNORMAL LOW (ref 39.0–52.0)
HCT: 31.3 % — ABNORMAL LOW (ref 39.0–52.0)
HEMOGLOBIN: 8.7 g/dL — AB (ref 13.0–17.0)
Hemoglobin: 9.9 g/dL — ABNORMAL LOW (ref 13.0–17.0)
LYMPHS PCT: 14 %
Lymphocytes Relative: 14 %
Lymphs Abs: 0.9 10*3/uL (ref 0.7–4.0)
Lymphs Abs: 1 10*3/uL (ref 0.7–4.0)
MCH: 28.2 pg (ref 26.0–34.0)
MCH: 28.2 pg (ref 26.0–34.0)
MCHC: 31.3 g/dL (ref 30.0–36.0)
MCHC: 31.6 g/dL (ref 30.0–36.0)
MCV: 90.3 fL (ref 78.0–100.0)
MCV: 89.2 fL (ref 78.0–100.0)
MONO ABS: 0.8 10*3/uL (ref 0.1–1.0)
MONOS PCT: 10 %
MONOS PCT: 10 %
Monocytes Absolute: 0.7 10*3/uL (ref 0.1–1.0)
NEUTROS ABS: 5 10*3/uL (ref 1.7–7.7)
Neutro Abs: 5.5 10*3/uL (ref 1.7–7.7)
Neutrophils Relative %: 74 %
Neutrophils Relative %: 74 %
PLATELETS: 367 10*3/uL (ref 150–400)
Platelets: 214 10*3/uL (ref 150–400)
RBC: 3.08 MIL/uL — ABNORMAL LOW (ref 4.22–5.81)
RBC: 3.51 MIL/uL — ABNORMAL LOW (ref 4.22–5.81)
RDW: 15.4 % (ref 11.5–15.5)
RDW: 15 % (ref 11.5–15.5)
WBC: 6.7 10*3/uL (ref 4.0–10.5)
WBC: 7.5 10*3/uL (ref 4.0–10.5)

## 2016-11-10 LAB — TYPE AND SCREEN
ABO/RH(D): B POS
Antibody Screen: NEGATIVE
UNIT DIVISION: 0
UNIT DIVISION: 0

## 2016-11-10 LAB — RENAL FUNCTION PANEL
ALBUMIN: 1.5 g/dL — AB (ref 3.5–5.0)
ALBUMIN: 2.1 g/dL — AB (ref 3.5–5.0)
Anion gap: 4 — ABNORMAL LOW (ref 5–15)
Anion gap: 8 (ref 5–15)
BUN: 12 mg/dL (ref 6–20)
BUN: 17 mg/dL (ref 6–20)
CALCIUM: 5.6 mg/dL — AB (ref 8.9–10.3)
CHLORIDE: 106 mmol/L (ref 101–111)
CO2: 20 mmol/L — ABNORMAL LOW (ref 22–32)
CO2: 25 mmol/L (ref 22–32)
CREATININE: 0.31 mg/dL — AB (ref 0.61–1.24)
CREATININE: 0.44 mg/dL — AB (ref 0.61–1.24)
Calcium: 8.1 mg/dL — ABNORMAL LOW (ref 8.9–10.3)
Chloride: 120 mmol/L — ABNORMAL HIGH (ref 101–111)
GFR calc Af Amer: >60 mL/min (ref >60)
GFR calc Af Amer: >60 mL/min (ref >60)
GLUCOSE: 97 mg/dL (ref 65–99)
GLUCOSE: 177 mg/dL — AB (ref 65–99)
PHOSPHORUS: 2.7 mg/dL (ref 2.5–4.6)
PHOSPHORUS: 3.5 mg/dL (ref 2.5–4.6)
Potassium: 2.6 mmol/L — CL (ref 3.5–5.1)
Potassium: 3.9 mmol/L (ref 3.5–5.1)
SODIUM: 144 mmol/L (ref 135–145)
Sodium: 139 mmol/L (ref 135–145)

## 2016-11-10 LAB — GLUCOSE, CAPILLARY
GLUCOSE-CAPILLARY: 130 mg/dL — AB (ref 65–99)
GLUCOSE-CAPILLARY: 119 mg/dL — AB (ref 65–99)
GLUCOSE-CAPILLARY: 213 mg/dL — AB (ref 65–99)
GLUCOSE-CAPILLARY: 192 mg/dL — AB (ref 65–99)
Glucose-Capillary: 181 mg/dL — ABNORMAL HIGH (ref 65–99)
Glucose-Capillary: 165 mg/dL — ABNORMAL HIGH (ref 65–99)

## 2016-11-10 LAB — MAGNESIUM: MAGNESIUM: 1.6 mg/dL — AB (ref 1.7–2.4)

## 2016-11-10 MED ORDER — MAGNESIUM SULFATE 4 GM/100ML IV SOLN
4.0000 g | Freq: Once | INTRAVENOUS | Status: DC
  Filled 2016-11-10: qty 100

## 2016-11-10 MED ORDER — SODIUM CHLORIDE 0.9 % IV SOLN
30.0000 meq | Freq: Once | INTRAVENOUS | Status: DC
  Filled 2016-11-10: qty 15

## 2016-11-10 MED ORDER — POTASSIUM CHLORIDE 20 MEQ/15ML (10%) PO SOLN
40.0000 meq | Freq: Once | ORAL | Status: DC
  Filled 2016-11-10: qty 30

## 2016-11-10 NOTE — Progress Notes (Signed)
eLink Physician-Brief Progress Note Patient Name: Luis AbedKong Scaffidi DOB: Aug 04, 1926 MRN: [id number]   Date of Service  11/10/2016  HPI/Events of Note  Low k, mag  eICU Interventions  sup     Intervention Category Intermediate Interventions: Electrolyte abnormality - evaluation and management  FEINSTEIN,DANIEL J. 11/10/2016, 6:05 AM

## 2016-11-10 NOTE — Progress Notes (Signed)
RT obtained tracheal sputum sample and sent to lab.

## 2016-11-10 NOTE — Progress Notes (Signed)
   Name: Sean [name] MRN: [id number] DOB: [birth year]    ADMISSION DATE:  10/23/2016 CONSULTATION DATE:  10/23/2016  REFERRING MD :  Ranae PalmsYelverton, M.D.  CHIEF COMPLAINT:  Acute onset of left hemiplegia and aphasia  BRIEF PATIENT DESCRIPTION: 82 y.o. admitted for stroke and required tracheostomy which was done 11/16.   SIGNIFICANT EVENTS:  11/10 - Admit 11/16 - S/P Tracheostomy by Ninetta LightsJY 11/24 - Oozing around tracheostomy on 11/24 on systemic anticoagulation  LINES/TUBES: ET tube 11/10 - 11/16 Left Radial Aline 11/10 - out R Femoral Sheath 11/10 - 11/11 Trach 11/16>> PEG 11/16>>  SUBJECTIVE:   Tolerating ATC x 24h No more bleeding afebrile    VITAL SIGNS: Temp:  [98.3 F (36.8 C)-99.5 F (37.5 C)] 98.5 F (36.9 C) (11/28 1151) Pulse Rate:  [79-95] 80 (11/28 1151) Resp:  [14-21] 14 (11/28 1151) BP: (115-155)/(61-84) 119/63 (11/28 1151) SpO2:  [99 %-100 %] 100 % (11/28 1151) FiO2 (%):  [35 %-40 %] 35 % (11/28 1139) Weight:  [182 lb (82.6 kg)] 182 lb (82.6 kg) (11/28 0341)  PHYSICAL EXAMINATION: General:  Eyes closed. No distress. No family at bedside. Neuro:  Spontaneously moving extremities. Opens left eye to voice.  HEENT:  Tracheostomy  Cardiovascular:  Regular rate. No edema. Pulmonary:  Remains on ventilator. Symmetric chest rise.  Abdomen:  Soft. Nondistended. Normal bowel sounds. Integument:  Warm & dry. No rash on exposed skin.    Recent Labs Lab 11/09/16 0403 11/10/16 0424 11/10/16 0618  NA 139 144 139  K 3.5 2.6* 3.9  CL 106 120* 106  CO2 23 20* 25  BUN 25* 12 17  CREATININE 0.56* 0.31* 0.44*  GLUCOSE 176* 97 177*    Recent Labs Lab 11/09/16 0403 11/10/16 0424 11/10/16 0618  HGB 9.7* 8.7* 9.9*  HCT 29.5* 27.8* 31.3*  WBC 8.3 6.7 7.5  PLT 357 214 367   No results found.  ASSESSMENT / PLAN:  82 y.o. male s/p tracheostomy with bleeding around tracheostomy on systemic anticoagulation. Increased purulent drainage around tracheostomy site  concerning.    1. Bleeding:  Secondary to anticoagulation. Resolved. 2. Right MCA CVA:  Due to right ICA occlusion. S/P mechanical thrombectomy. Holding anticoagulation with bleeding. 3. Acute Hypoxic Respiratory Failure:  Secondary to CVA and now acute bleeding. S/P Tracheostomy. Attempt to transition back to [employer]  4. Cellulitis:  At tracheostomy insertion site. ct empiric vancomycin x 5-7ds . Wound care consulted. 5. Acute Encephalopathy/Delirium:  fent prn pain, Haldol IV prn for delirium.  6. Anemia:  S/P 1u PRBC on 11/24. Hgb stable. Trending cell counts daily w/ CBC. Transfuse for active bleeding or Hgb <7.0. 7. Chronic Atrial Fibrillation: OK to trial back on xarelto at some point 8. Prolonged QTc:  482 on 11/26  PCCM available as needed  Cyril Mourningakesh Alva MD. FCCP. [facility] Pager [phone] If no response call [phone]    11/10/2016, 12:11 PM

## 2016-11-10 NOTE — Care Management Important Message (Signed)
Important Message  Patient Details  Name: Sean [name] MRN: [id number] Date of Birth: 07-07-1926   Medicare Important Message Given:  Yes    Jaquis Picklesimer Abena 11/10/2016, 11:46 AM

## 2016-11-10 NOTE — Progress Notes (Signed)
PROGRESS NOTE    Sean [name]  WJX:[id number] DOB: 04-18-1926 DOA: 10/23/2016 PCP: No primary care provider on file.   Brief Narrative:   82 y.o.  MANDARIN SPEAKING AM PMHx HTN, Atrial Fibrillation on Pradaxa (noncompliance with medication),  Presenting with left hemiplegia and speech difficulties. He did not receive IV t-PA due to anticoagulation. Had mechanical thrombectomy   Subjective: 11/28  A/O 0, withdraws to painful stimuli. Tmax last 24 hours 38.2C   Assessment & Plan:   Active Problems:   Cerebral embolism with cerebral infarction   Cerebrovascular accident (CVA) due to thrombosis of precerebral artery (HCC)   Acute respiratory failure with hypoxia (HCC)   Cerebrovascular accident (CVA) due to embolism of right carotid artery (HCC)   Encounter for intubation   Chronic a-fib (HCC)   Dysphagia   Acute blood loss anemia   Thrombocytopenia (HCC)   Benign essential HTN   Bradycardia   Pain   Ventilator dependence (HCC)   HCAP (healthcare-associated pneumonia)   Cerebral infarction due to embolism of right carotid artery (HCC)   Essential hypertension   Tracheostomy care (HCC)   Right MCA CVA due to R ICA collusion s/p mechanical thrombectomy  -Felt to be embolic in setting of afib w/ Pradaxa noncompliance - anticoag currently on hold given large CVA territory  - Patient for SNF when cleared by pulmonology. Neurology has artery cleared for SNF  -Scheduled follow up with Dr. Marvel Plan at [facility] in about 6 weeks  Acute Respiratory Failure secondary to CVA -Trach 11/16  - ongoing tracheostomy care per PCCM - on TC at time of exam today   VAP/HCAP/Pulmonary Edema -Antibiotic therapy initiated by PCCM 11/17: Completed course of antibiotics -Tracheal aspirate on 11/17, 11/19 normal respiratory flora  -11/28 Patient with foul-smelling purulent sputum: Culture pending.  Chronic Afib -Noncompliant w/ Pradaxa - rate controlled at this time  -Eliquis discontinued  secondary to bleeding around trach  HTN -Controlled  HLD -LDL 53  -Per neurology niacin not resumed  Hypokalemia -Potassium goal > 4   Hypomagnesemia -Magnesium goal> 2 -Magnesium IV 4 g  Dysphagia  -PEG 11/16  - continue tube feeds  Thrombocytopenia -unknown baseline, on chronic anticoagulation  -Resolved  Normocytic Anemia -No gross evidence of blood loss  Recent Labs Lab 11/07/16 0738 11/08/16 0400 11/09/16 0403 11/10/16 0424 11/10/16 0618  HGB 11.2* 9.3* 9.7* 8.7* 9.9*  -stable  Bleeding around Tracheostomy -Appears Eliquis held secondary to bleeding around tracheostomy  Prediabetes  -A1c 6.2 - consistent with prediabetic state -Continue ICU hyperglycemia protocol    1. Bleeding:  Secondary to anticoagulation. Resolved. 2. Right MCA CVA:  Due to right ICA occlusion. S/P mechanical thrombectomy. Holding anticoagulation with bleeding. 3. Acute Hypoxic Respiratory Failure:  Secondary to CVA and now acute bleeding. S/P Tracheostomy. Attempt to transition back to [employer]  4. Cellulitis:  At tracheostomy insertion site. ct empiric vancomycin. Wound care consulted. 5. Acute Encephalopathy/Delirium:  fent prn pain, Haldol IV prn for delirium.  6. Anemia:  S/P 1u PRBC on 11/24. Hgb stable. Trending cell counts daily w/ CBC. Transfuse for active bleeding or Hgb <7.0. 7.  8. Prolonged QTc:  482 on 11/26 9. Constipation:  Starting Senna via tube bid.       DVT prophylaxis: SCD + Protonix  Code Status: Full Family Communication: Wife present Disposition Plan: [facility] SN[facility]   Consultants:  PCCM Neurology    Procedures/Significant Events:  11/10 admit w/ acute CVA - to IR for recanalization of R ICA -  intubated 11/16 tracheostomy and PEG placed  11/20 TRH assumed cre   11/10 - Admit 11/16 - S/P Tracheostomy by JY 11/24 - Oozing around tracheostomy on 11/24 on systemic anticoagulation 11/24 transfuse 1u PRBC   VENTILATOR  SETTINGS:    Cultures 11/10 MRSA by PCR negative 11/10 blood 2 negative 11/16 urine negative 11/16 tracheal aspirate normal flora 11/19 tracheal aspirate normal flora 11/28 tracheal aspirate pending   Antimicrobials: Ceftriaxone 11/17 >> Ciprofloxacin eyedrops 11/21>>   Devices    LINES / TUBES:  ET tube 11/10 - 11/16 Left Radial Aline 11/10 - out R Femoral Sheath 11/10 - 11/11 Trach 11/16>> PEG 11/16>>    Continuous Infusions: . sodium chloride 10 mL/hr at 11/10/16 0700  . feeding supplement (JEVITY 1.2 CAL) 1,000 mL (11/10/16 0700)     Objective: Vitals:   11/10/16 0452 11/10/16 0500 11/10/16 0600 11/10/16 0754  BP:   128/66 127/63  Pulse: 91 79 90 91  Resp: 20 14 18 15   Temp:    98.3 F (36.8 C)  TempSrc:    Oral  SpO2: 99% 100% 100% 99%  Weight:      Height:        Intake/Output Summary (Last 24 hours) at 11/10/16 0826 Last data filed at 11/10/16 0700  Gross per 24 hour  Intake          1741.67 ml  Output              900 ml  Net           841.67 ml   Filed Weights   11/08/16 0400 11/09/16 0400 11/10/16 0341  Weight: 74.4 kg (164 lb 0.4 oz) 81.3 kg (179 lb 3.7 oz) 82.6 kg (182 lb)    Examination:  General:  A/O 0, No acute respiratory distress Eyes: negative scleral hemorrhage, positive anisocoria, negative icterus ENT: Negative Runny nose, negative gingival bleeding, Neck:  Negative scars, masses, torticollis, lymphadenopathy, JVD Lungs: diffuse rhonchi, thick yellow-green purulent (foul-smelling) sputum from trach tube  Cardiovascular: Irregular irregular rhythm and rate, without murmur gallop or rub normal S1 and S2 Abdomen: negative abdominal pain, nondistended, positive soft, bowel sounds, no rebound, no ascites, no appreciable mass Extremities: No significant cyanosis, clubbing, or edema bilateral lower extremities Skin: Negative rashes, lesions, ulcers Psychiatric:  Unable to evaluate  Central nervous system:  Unable to evaluate  other than patient withdraws to painful stimuli, positive anisocoria .     Data Reviewed: Care during the described time interval was provided by me .  I have reviewed this patient's available data, including medical history, events of note, physical examination, and all test results as part of my evaluation. I have personally reviewed and interpreted all radiology studies.  CBC:  Recent Labs Lab 11/05/16 0537  11/06/16 0831  11/07/16 0738 11/08/16 0400 11/09/16 0403 11/10/16 0424 11/10/16 0618  WBC 8.9  < > 13.2*  --   --  9.1 8.3 6.7 7.5  NEUTROABS 6.4  --   --   --   --  7.5 6.1 5.0 5.5  HGB 10.1*  < > 9.9*  < > 11.2* 9.3* 9.7* 8.7* 9.9*  HCT 31.1*  < > 31.2*  < > 34.2* 28.7* 29.5* 27.8* 31.3*  MCV 87.9  < > 88.9  --   --  88.3 89.1 90.3 89.2  PLT 306  < > 353  --   --  344 357 214 367  < > = values in this interval not displayed. Basic  Metabolic Panel:  Recent Labs Lab 11/04/16 0550  11/06/16 0831 11/08/16 0400 11/08/16 0420 11/09/16 0403 11/10/16 0424 11/10/16 0618  NA 137  < > 137  --  139 139 144 139  K 3.5  < > 4.3  --  3.5 3.5 2.6* 3.9  CL 101  < > 101  --  106 106 120* 106  CO2 25  < > 27  --  23 23 20* 25  GLUCOSE 165*  < > 182*  --  167* 176* 97 177*  BUN 19  < > 28*  --  30* 25* 12 17  CREATININE 0.64  < > 0.82  --  0.58* 0.56* 0.31* 0.44*  CALCIUM 8.2*  < > 8.1*  --  7.5* 8.0* 5.6* 8.1*  MG 2.1  --  2.6* 2.3  --  2.4 1.6*  --   PHOS 4.2  --  4.7*  --  3.7 4.2 2.7 3.5  < > = values in this interval not displayed. GFR: Estimated Creatinine Clearance: 65.6 mL/min (by C-G formula based on SCr of 0.44 mg/dL (L)). Liver Function Tests:  Recent Labs Lab 11/08/16 0420 11/09/16 0403 11/10/16 0424 11/10/16 0618  ALBUMIN 2.0* 2.2* 1.5* 2.1*   No results for input(s): LIPASE, AMYLASE in the last 168 hours. No results for input(s): AMMONIA in the last 168 hours. Coagulation Profile:  Recent Labs Lab 11/06/16 0831  INR 1.39   Cardiac Enzymes: No  results for input(s): CKTOTAL, CKMB, CKMBINDEX, TROPONINI in the last 168 hours. BNP (last 3 results) No results for input(s): PROBNP in the last 8760 hours. HbA1C: No results for input(s): HGBA1C in the last 72 hours. CBG:  Recent Labs Lab 11/09/16 1202 11/09/16 1553 11/09/16 2000 11/09/16 2245 11/10/16 0338  GLUCAP 130* 178* 181* 218* 119*   Lipid Profile: No results for input(s): CHOL, HDL, LDLCALC, TRIG, CHOLHDL, LDLDIRECT in the last 72 hours. Thyroid Function Tests: No results for input(s): TSH, T4TOTAL, FREET4, T3FREE, THYROIDAB in the last 72 hours. Anemia Panel: No results for input(s): VITAMINB12, FOLATE, FERRITIN, TIBC, IRON, RETICCTPCT in the last 72 hours. Urine analysis:    Component Value Date/Time   COLORURINE AMBER (A) 10/29/2016 1330   APPEARANCEUR TURBID (A) 10/29/2016 1330   LABSPEC 1.024 10/29/2016 1330   PHURINE 8.5 (H) 10/29/2016 1330   GLUCOSEU NEGATIVE 10/29/2016 1330   HGBUR NEGATIVE 10/29/2016 1330   BILIRUBINUR MODERATE (A) 10/29/2016 1330   KETONESUR NEGATIVE 10/29/2016 1330   PROTEINUR 100 (A) 10/29/2016 1330   NITRITE POSITIVE (A) 10/29/2016 1330   LEUKOCYTESUR SMALL (A) 10/29/2016 1330   Sepsis Labs: @LABRCNTIP (procalcitonin:4,lacticidven:4)  ) Recent Results (from the past 240 hour(s))  Culture, respiratory (NON-Expectorated)     Status: None   Collection Time: 11/01/16  1:52 PM  Result Value Ref Range Status   Specimen Description TRACHEAL ASPIRATE  Final   Special Requests NONE  Final   Gram Stain   Final    MODERATE WBC PRESENT,BOTH PMN AND MONONUCLEAR RARE GRAM POSITIVE COCCI IN PAIRS RARE GRAM NEGATIVE RODS    Culture Consistent with normal respiratory flora.  Final   Report Status 11/03/2016 FINAL  Final         Radiology Studies: No results found.      Scheduled Meds: . bacitracin-polymyxin b   Right Eye QHS  . chlorhexidine  15 mL Mouth Rinse BID  . ciprofloxacin  2 drop Right Eye TID AC  . feeding  supplement (PRO-STAT SUGAR FREE 64)  30 mL  Per Tube TID  . free water  100 mL Per Tube Q8H  . hydrALAZINE  50 mg Per Tube Q8H  . insulin aspart  2-6 Units Subcutaneous Q4H  . magnesium sulfate 1 - 4 g bolus IVPB  4 g Intravenous Once  . mouth rinse  15 mL Mouth Rinse 10 times per day  . pantoprazole sodium  40 mg Per Tube Daily  . potassium chloride  40 mEq Per Tube Once  . potassium chloride (KCL MULTIRUN) 30 mEq in 265 mL IVPB  30 mEq Intravenous Once  . sennosides  5 mL Per Tube BID  . vancomycin  1,250 mg Intravenous Q24H   Continuous Infusions: . sodium chloride 10 mL/hr at 11/10/16 0700  . feeding supplement (JEVITY 1.2 CAL) 1,000 mL (11/10/16 0700)     LOS: 18 days    Time spent: 40 minutes    Keiffer Piper, Roselind MessierURTIS J, MD [facility] Pager [phone]   If 7PM-7AM, please contact night-coverage [email or web address] Password [email or web address] 11/10/2016, 8:26 AM

## 2016-11-11 ENCOUNTER — Inpatient Hospital Stay (HOSPITAL_COMMUNITY): Payer: Medicare (Managed Care)

## 2016-11-11 DIAGNOSIS — R58 Hemorrhage, not elsewhere classified: Secondary | ICD-10-CM

## 2016-11-11 LAB — CBC WITH DIFFERENTIAL/PLATELET
Basophils Absolute: 0 10*3/uL (ref 0.0–0.1)
Basophils Relative: 0 %
EOS ABS: 0.3 10*3/uL (ref 0.0–0.7)
EOS PCT: 3 %
HCT: 29 % — ABNORMAL LOW (ref 39.0–52.0)
Hemoglobin: 9.3 g/dL — ABNORMAL LOW (ref 13.0–17.0)
LYMPHS ABS: 1.3 10*3/uL (ref 0.7–4.0)
Lymphocytes Relative: 17 %
MCH: 28.6 pg (ref 26.0–34.0)
MCHC: 32.1 g/dL (ref 30.0–36.0)
MCV: 89.2 fL (ref 78.0–100.0)
MONO ABS: 0.8 10*3/uL (ref 0.1–1.0)
MONOS PCT: 10 %
Neutro Abs: 5.4 10*3/uL (ref 1.7–7.7)
Neutrophils Relative %: 70 %
PLATELETS: 385 10*3/uL (ref 150–400)
RBC: 3.25 MIL/uL — AB (ref 4.22–5.81)
RDW: 14.7 % (ref 11.5–15.5)
WBC: 7.8 10*3/uL (ref 4.0–10.5)

## 2016-11-11 LAB — RENAL FUNCTION PANEL
Albumin: 2 g/dL — ABNORMAL LOW (ref 3.5–5.0)
Anion gap: 7 (ref 5–15)
BUN: 15 mg/dL (ref 6–20)
CALCIUM: 7.8 mg/dL — AB (ref 8.9–10.3)
CHLORIDE: 107 mmol/L (ref 101–111)
CO2: 25 mmol/L (ref 22–32)
CREATININE: 0.56 mg/dL — AB (ref 0.61–1.24)
GFR calc Af Amer: >60 mL/min (ref >60)
GFR calc non Af Amer: >60 mL/min (ref >60)
GLUCOSE: 179 mg/dL — AB (ref 65–99)
Phosphorus: 3.4 mg/dL (ref 2.5–4.6)
Potassium: 3.9 mmol/L (ref 3.5–5.1)
SODIUM: 139 mmol/L (ref 135–145)

## 2016-11-11 LAB — GLUCOSE, CAPILLARY
GLUCOSE-CAPILLARY: 145 mg/dL — AB (ref 65–99)
GLUCOSE-CAPILLARY: 188 mg/dL — AB (ref 65–99)
GLUCOSE-CAPILLARY: 163 mg/dL — AB (ref 65–99)
Glucose-Capillary: 134 mg/dL — ABNORMAL HIGH (ref 65–99)
Glucose-Capillary: 139 mg/dL — ABNORMAL HIGH (ref 65–99)
Glucose-Capillary: 176 mg/dL — ABNORMAL HIGH (ref 65–99)
Glucose-Capillary: 186 mg/dL — ABNORMAL HIGH (ref 65–99)

## 2016-11-11 LAB — VANCOMYCIN, TROUGH: Vancomycin Tr: 5 ug/mL — ABNORMAL LOW (ref 15–20)

## 2016-11-11 LAB — MAGNESIUM: Magnesium: 2.4 mg/dL (ref 1.7–2.4)

## 2016-11-11 MED ORDER — VANCOMYCIN HCL 10 G IV SOLR
1250.0000 mg | Freq: Two times a day (BID) | INTRAVENOUS | Status: DC
  Administered 2016-11-11 – 2016-11-12 (×2): 1250 mg via INTRAVENOUS
  Filled 2016-11-11 (×3): qty 1250

## 2016-11-11 MED ORDER — INSULIN ASPART 100 UNIT/ML ~~LOC~~ SOLN: 0.0000 [IU] | SUBCUTANEOUS | Status: DC

## 2016-11-11 MED ORDER — INSULIN ASPART 100 UNIT/ML ~~LOC~~ SOLN
0.0000 [IU] | SUBCUTANEOUS | Status: DC
  Administered 2016-11-11: 2 [IU] via SUBCUTANEOUS
  Administered 2016-11-11: 3 [IU] via SUBCUTANEOUS
  Administered 2016-11-12: 2 [IU] via SUBCUTANEOUS
  Administered 2016-11-12: 3 [IU] via SUBCUTANEOUS
  Administered 2016-11-12 (×2): 2 [IU] via SUBCUTANEOUS
  Administered 2016-11-12: 3 [IU] via SUBCUTANEOUS
  Administered 2016-11-13 (×2): 2 [IU] via SUBCUTANEOUS
  Administered 2016-11-13: 5 [IU] via SUBCUTANEOUS
  Administered 2016-11-13: 2 [IU] via SUBCUTANEOUS
  Administered 2016-11-14: 3 [IU] via SUBCUTANEOUS
  Administered 2016-11-14 (×3): 2 [IU] via SUBCUTANEOUS
  Administered 2016-11-15: 3 [IU] via SUBCUTANEOUS
  Administered 2016-11-15: 2 [IU] via SUBCUTANEOUS
  Administered 2016-11-15 (×2): 3 [IU] via SUBCUTANEOUS
  Administered 2016-11-15 (×2): 2 [IU] via SUBCUTANEOUS
  Administered 2016-11-16: 3 [IU] via SUBCUTANEOUS
  Administered 2016-11-16: 2 [IU] via SUBCUTANEOUS
  Administered 2016-11-16 – 2016-11-17 (×5): 3 [IU] via SUBCUTANEOUS
  Administered 2016-11-17 (×4): 2 [IU] via SUBCUTANEOUS
  Administered 2016-11-18 (×2): 3 [IU] via SUBCUTANEOUS
  Administered 2016-11-18 (×2): 2 [IU] via SUBCUTANEOUS
  Administered 2016-11-18: 3 [IU] via SUBCUTANEOUS
  Administered 2016-11-18: 2 [IU] via SUBCUTANEOUS
  Administered 2016-11-19: 3 [IU] via SUBCUTANEOUS
  Administered 2016-11-19: 2 [IU] via SUBCUTANEOUS
  Administered 2016-11-19 (×2): 3 [IU] via SUBCUTANEOUS
  Administered 2016-11-19: 2 [IU] via SUBCUTANEOUS
  Administered 2016-11-20: 3 [IU] via SUBCUTANEOUS
  Administered 2016-11-20: 2 [IU] via SUBCUTANEOUS
  Administered 2016-11-20: 3 [IU] via SUBCUTANEOUS
  Administered 2016-11-20: 2 [IU] via SUBCUTANEOUS

## 2016-11-11 MED ORDER — ACETYLCYSTEINE 20 % IN SOLN
4.0000 mL | Freq: Four times a day (QID) | RESPIRATORY_TRACT | Status: DC
  Administered 2016-11-11 – 2016-11-14 (×12): 4 mL via RESPIRATORY_TRACT
  Filled 2016-11-11 (×13): qty 4

## 2016-11-11 NOTE — Progress Notes (Signed)
Upon assessment this morning, skin sloughing off of sacral DTI revealing pink & red wound base. Sacral foam dsg changed and new one placed. WOC reconsulted to assess for dressing changes and wound care needs. Will continue to monitor.

## 2016-11-11 NOTE — Progress Notes (Signed)
Pharmacy Antibiotic Note  Sean [name] is a 82 y.o. male admitted on 10/23/2016 with concern for cellulitis and increased purulent drainage around tracheostomy site.  Pharmacy has been consulted for vancomycin dosing. Renal function is stable, estimated CrCl ~65 ml/min. Goal vancomycin trough is 10 - 15 mcg/mL. -vancomycin trough: 5 (11am) on 1250mg  IV q24h  Plan: - Change vancomycin to 1250mg  IV q12h -Will follow plans for length of therapy - Monitor renal function and vancomycin trough as needed  Height: 5\' 7"  (170.2 cm) Weight: 185 lb (83.9 kg) IBW/kg (Calculated) : 66.1  Temp (24hrs), Avg:98 F (36.7 C), Min:97.3 F (36.3 C), Max:98.6 F (37 C)   Recent Labs Lab 11/08/16 0400 11/08/16 0420 11/09/16 0403 11/10/16 0424 11/10/16 0618 11/11/16 0522 11/11/16 1054  WBC 9.1  --  8.3 6.7 7.5 7.8  --   CREATININE  --  0.58* 0.56* 0.31* 0.44* 0.56*  --   VANCOTROUGH  --   --   --   --   --   --  5*    Estimated Creatinine Clearance: 66.1 mL/min (by C-G formula based on SCr of 0.56 mg/dL (L)).    No Known Allergies  Antimicrobials this admission: Ceftriaxone 11/18>>11/22 Vancomycin 11/26 >>  Adjustments 11/29 VT= 5; change to vancomycin 1250mg  IV q12h  Microbiology Results: 11/10 mrsa pcr: neg 11/16 UCx: Neg 11/16 BCx: neg 11/7 resp cx: normal resp flora 11/19 resp cx: normal resp flora  Thank you for allowing pharmacy to be a part of this patient's care.  Casilda Carlsaylor Stone, PharmD. PGY-2 Infectious Diseases Pharmacy Resident Pager: [phone] 11/11/2016 1:03 PM

## 2016-11-11 NOTE — Plan of Care (Signed)
Problem: Nutrition: Goal: Dietary intake will improve Outcome: Progressing Tube feedings infusing

## 2016-11-11 NOTE — Progress Notes (Signed)
PROGRESS NOTE    Sean [name]  ZOX:[id number] DOB: 02/05/1926 DOA: 10/23/2016 PCP: No primary care provider on file.   Brief Narrative:   82 y.o.  MANDARIN SPEAKING AM PMHx HTN, Atrial Fibrillation on Pradaxa (noncompliance with medication),  Presenting with left hemiplegia and speech difficulties. He did not receive IV t-PA due to anticoagulation. Had mechanical thrombectomy   Subjective: 11/29  A/O 0, withdraws to painful stimuli. Eyes open Tmax last 24 hours    Assessment & Plan:   Active Problems:   Cerebral embolism with cerebral infarction   Cerebrovascular accident (CVA) due to thrombosis of precerebral artery (HCC)   Acute respiratory failure with hypoxia (HCC)   Cerebrovascular accident (CVA) due to embolism of right carotid artery (HCC)   Encounter for intubation   Chronic a-fib (HCC)   Dysphagia   Acute blood loss anemia   Thrombocytopenia (HCC)   Benign essential HTN   Bradycardia   Pain   Ventilator dependence (HCC)   HCAP (healthcare-associated pneumonia)   Cerebral infarction due to embolism of right carotid artery (HCC)   Essential hypertension   Tracheostomy care (HCC)   Pressure injury of skin   Right MCA CVA due to R ICA collusion s/p mechanical thrombectomy  -Felt to be embolic in setting of afib w/ Pradaxa noncompliance - anticoag currently on hold given large CVA territory  - Patient for SNF when cleared by pulmonology. Neurology has artery cleared for SNF  -Scheduled follow up with Dr. Marvel Plan at [facility] in about 6 weeks  Acute Respiratory Failure secondary to CVA -Trach 11/16  - Mucomyst nebulizer QID + tracheal suction QID - on TC at time of exam today, thick purulent foul-smelling sputum   VAP/HCAP/Pulmonary Edema -Antibiotic therapy initiated by PCCM 11/17: Completed course of antibiotics -Tracheal aspirate on 11/17, 11/19 normal respiratory flora  -11/28 Patient with foul-smelling purulent sputum: Culture pending.  Chronic  Afib -Noncompliant w/ Pradaxa - rate controlled at this time  -Eliquis discontinued secondary to bleeding around trach  HTN -Controlled  HLD -LDL 53  -Per neurology niacin not resumed  Hypokalemia -Potassium goal > 4   Hypomagnesemia -Magnesium goal> 2   Dysphagia  -PEG 11/16  - continue tube feeds  Thrombocytopenia -unknown baseline, on chronic anticoagulation  -Resolved  Normocytic Anemia -No gross evidence of blood loss   Recent Labs Lab 11/08/16 0400 11/09/16 0403 11/10/16 0424 11/10/16 0618 11/11/16 0522  HGB 9.3* 9.7* 8.7* 9.9* 9.3*  -stable  Bleeding around Tracheostomy -Eliquis held secondary to bleeding around tracheostomy  Prediabetes  -A1c 6.2 - consistent with prediabetic state -Sensitive SSI  Prolonged QT interval -11/26 QT interval 482 -Repeat EKG on 11/30        DVT prophylaxis: SCD + Protonix  Code Status: Full Family Communication: Wife present Disposition Plan: Vent SNF   Consultants:  PCCM Neurology    Procedures/Significant Events:  11/10 admit w/ acute CVA - to IR for recanalization of R ICA - intubated 11/13 Echocardiogram:Left ventricle: The cavity size was normal. There was mild focal   basal hypertrophy of the septum. Systolic function was normal.   The estimated ejection fraction was in the range of 55% to 60%.   Wall motion was normal; there were no regional wall motion   abnormalities. - Aortic valve: Trileaflet; mildly thickened, mildly calcified   leaflets. There was trivial regurgitation. - Aortic root: The aortic root was normal in size. - Mitral valve: Calcified annulus. There was mild regurgitation. - Left atrium: The atrium was  mildly dilated. - Right ventricle: The cavity size was mildly dilated. Wall   thickness was normal. Systolic function was mildly reduced. - Right atrium: The atrium was mildly dilated. - Tricuspid valve: There was moderate regurgitation. - Pulmonary arteries:  Systolic pressure was at the upper limits of   normal. PA peak pressure: 32 mm Hg (S). - Inferior vena cava: The vessel was normal in size. - Pericardium, extracardiac: There was no pericardial effusion. 11/16 tracheostomy and PEG placed  11/20 TRH assumed cre  11/24 - Oozing around tracheostomy on 11/24 on systemic anticoagulation 11/24 transfuse 1u PRBC   VENTILATOR SETTINGS:    Cultures 11/10 MRSA by PCR negative 11/10 blood 2 negative 11/16 urine negative 11/16 tracheal aspirate normal flora 11/19 tracheal aspirate normal flora 11/28 tracheal aspirate pending   Antimicrobials: Ceftriaxone 11/17 >>11/22 Ciprofloxacin eyedrops 11/21>> Vancomycin 11/26>>   Devices    LINES / TUBES:  ET tube 11/10 - 11/16 Left Radial Aline 11/10 - out R Femoral Sheath 11/10 - 11/11 Trach 11/16>> PEG 11/16>>    Continuous Infusions: . sodium chloride 10 mL/hr at 11/10/16 1800  . feeding supplement (JEVITY 1.2 CAL) 1,000 mL (11/11/16 0913)     Objective: Vitals:   11/11/16 0900 11/11/16 1131 11/11/16 1200 11/11/16 1203  BP:  126/73 123/68 123/68  Pulse: 91 95 79 80  Resp: 15 16 16 15   Temp:    98.5 F (36.9 C)  TempSrc:    Oral  SpO2: 97% 100% 98% 97%  Weight:      Height:        Intake/Output Summary (Last 24 hours) at 11/11/16 1324 Last data filed at 11/11/16 1200  Gross per 24 hour  Intake             2020 ml  Output             1050 ml  Net              970 ml   Filed Weights   11/09/16 0400 11/10/16 0341 11/11/16 0313  Weight: 81.3 kg (179 lb 3.7 oz) 82.6 kg (182 lb) 83.9 kg (185 lb)    Examination:  General:  A/O 0, Eyes open,,Withdraws to painful stimuli No acute respiratory distress Eyes: negative scleral hemorrhage, positive anisocoria, negative icterus ENT: Negative Runny nose, negative gingival bleeding, Neck:  Negative scars, masses, torticollis, lymphadenopathy, JVD Lungs: diffuse rhonchi, thick yellow-green purulent (foul-smelling) sputum  from trach tube  Cardiovascular: Irregular irregular rhythm and rate, without murmur gallop or rub normal S1 and S2 Abdomen: negative abdominal pain, nondistended, positive soft, bowel sounds, no rebound, no ascites, no appreciable mass Extremities: No significant cyanosis, clubbing, or edema bilateral lower extremities Skin: Negative rashes, lesions, ulcers Psychiatric:  Unable to evaluate  Central nervous system:  Unable to evaluate other than patient withdraws to painful stimuli, positive anisocoria .     Data Reviewed: Care during the described time interval was provided by me .  I have reviewed this patient's available data, including medical history, events of note, physical examination, and all test results as part of my evaluation. I have personally reviewed and interpreted all radiology studies.  CBC:  Recent Labs Lab 11/08/16 0400 11/09/16 0403 11/10/16 0424 11/10/16 0618 11/11/16 0522  WBC 9.1 8.3 6.7 7.5 7.8  NEUTROABS 7.5 6.1 5.0 5.5 5.4  HGB 9.3* 9.7* 8.7* 9.9* 9.3*  HCT 28.7* 29.5* 27.8* 31.3* 29.0*  MCV 88.3 89.1 90.3 89.2 89.2  PLT 344 357 214 367 385  Basic Metabolic Panel:  Recent Labs Lab 11/06/16 0831 11/08/16 0400 11/08/16 0420 11/09/16 0403 11/10/16 0424 11/10/16 0618 11/11/16 0522  NA 137  --  139 139 144 139 139  K 4.3  --  3.5 3.5 2.6* 3.9 3.9  CL 101  --  106 106 120* 106 107  CO2 27  --  23 23 20* 25 25  GLUCOSE 182*  --  167* 176* 97 177* 179*  BUN 28*  --  30* 25* 12 17 15   CREATININE 0.82  --  0.58* 0.56* 0.31* 0.44* 0.56*  CALCIUM 8.1*  --  7.5* 8.0* 5.6* 8.1* 7.8*  MG 2.6* 2.3  --  2.4 1.6*  --  2.4  PHOS 4.7*  --  3.7 4.2 2.7 3.5 3.4   GFR: Estimated Creatinine Clearance: 66.1 mL/min (by C-G formula based on SCr of 0.56 mg/dL (L)). Liver Function Tests:  Recent Labs Lab 11/08/16 0420 11/09/16 0403 11/10/16 0424 11/10/16 0618 11/11/16 0522  ALBUMIN 2.0* 2.2* 1.5* 2.1* 2.0*   No results for input(s): LIPASE, AMYLASE in  the last 168 hours. No results for input(s): AMMONIA in the last 168 hours. Coagulation Profile:  Recent Labs Lab 11/06/16 0831  INR 1.39   Cardiac Enzymes: No results for input(s): CKTOTAL, CKMB, CKMBINDEX, TROPONINI in the last 168 hours. BNP (last 3 results) No results for input(s): PROBNP in the last 8760 hours. HbA1C: No results for input(s): HGBA1C in the last 72 hours. CBG:  Recent Labs Lab 11/10/16 2029 11/11/16 0040 11/11/16 0412 11/11/16 0815 11/11/16 1206  GLUCAP 192* 139* 176* 188* 163*   Lipid Profile: No results for input(s): CHOL, HDL, LDLCALC, TRIG, CHOLHDL, LDLDIRECT in the last 72 hours. Thyroid Function Tests: No results for input(s): TSH, T4TOTAL, FREET4, T3FREE, THYROIDAB in the last 72 hours. Anemia Panel: No results for input(s): VITAMINB12, FOLATE, FERRITIN, TIBC, IRON, RETICCTPCT in the last 72 hours. Urine analysis:    Component Value Date/Time   COLORURINE AMBER (A) 10/29/2016 1330   APPEARANCEUR TURBID (A) 10/29/2016 1330   LABSPEC 1.024 10/29/2016 1330   PHURINE 8.5 (H) 10/29/2016 1330   GLUCOSEU NEGATIVE 10/29/2016 1330   HGBUR NEGATIVE 10/29/2016 1330   BILIRUBINUR MODERATE (A) 10/29/2016 1330   KETONESUR NEGATIVE 10/29/2016 1330   PROTEINUR 100 (A) 10/29/2016 1330   NITRITE POSITIVE (A) 10/29/2016 1330   LEUKOCYTESUR SMALL (A) 10/29/2016 1330   Sepsis Labs: @LABRCNTIP (procalcitonin:4,lacticidven:4)  ) Recent Results (from the past 240 hour(s))  Culture, respiratory (NON-Expectorated)     Status: None   Collection Time: 11/01/16  1:52 PM  Result Value Ref Range Status   Specimen Description TRACHEAL ASPIRATE  Final   Special Requests NONE  Final   Gram Stain   Final    MODERATE WBC PRESENT,BOTH PMN AND MONONUCLEAR RARE GRAM POSITIVE COCCI IN PAIRS RARE GRAM NEGATIVE RODS    Culture Consistent with normal respiratory flora.  Final   Report Status 11/03/2016 FINAL  Final  Culture, respiratory (NON-Expectorated)     Status:  None (Preliminary result)   Collection Time: 11/10/16 10:53 PM  Result Value Ref Range Status   Specimen Description TRACHEAL ASPIRATE  Final   Special Requests NONE  Final   Gram Stain PENDING  Incomplete   Culture TOO YOUNG TO READ  Final   Report Status PENDING  Incomplete         Radiology Studies: Dg Chest Port 1 View  Result Date: 11/11/2016 CLINICAL DATA:  Pneumonia. EXAM: PORTABLE CHEST 1 VIEW COMPARISON:  Radiograph of November 06, 2016. FINDINGS: Stable cardiomegaly. Tracheostomy tube is unchanged in position. No pneumothorax is noted. Mild bibasilar subsegmental atelectasis or infiltrates are noted. Bony thorax is unremarkable. IMPRESSION: Increase bibasilar opacities are noted concerning for worsening atelectasis or possibly infiltrates. Electronically Signed   By: Lupita RaiderJames  Green Jr, M.D.   On: 11/11/2016 07:38        Scheduled Meds: . bacitracin-polymyxin b   Right Eye QHS  . chlorhexidine  15 mL Mouth Rinse BID  . ciprofloxacin  2 drop Right Eye TID AC  . feeding supplement (PRO-STAT SUGAR FREE 64)  30 mL Per Tube TID  . free water  100 mL Per Tube Q8H  . hydrALAZINE  50 mg Per Tube Q8H  . insulin aspart  2-6 Units Subcutaneous Q4H  . magnesium sulfate 1 - 4 g bolus IVPB  4 g Intravenous Once  . mouth rinse  15 mL Mouth Rinse 10 times per day  . pantoprazole sodium  40 mg Per Tube Daily  . potassium chloride  40 mEq Per Tube Once  . potassium chloride (KCL MULTIRUN) 30 mEq in 265 mL IVPB  30 mEq Intravenous Once  . sennosides  5 mL Per Tube BID  . vancomycin  1,250 mg Intravenous Q12H   Continuous Infusions: . sodium chloride 10 mL/hr at 11/10/16 1800  . feeding supplement (JEVITY 1.2 CAL) 1,000 mL (11/11/16 0913)     LOS: 19 days    Time spent: 40 minutes    Coriann Brouhard, Roselind MessierURTIS J, MD [facility] Pager [phone]   If 7PM-7AM, please contact night-coverage [email or web address] Password [email or web address] 11/11/2016, 1:24 PM

## 2016-11-11 NOTE — Progress Notes (Signed)
Nutrition Follow-up  DOCUMENTATION CODES:   Not applicable  INTERVENTION:   -Continue Jevity 1.2 at 55 ml/h with Prostat 30 ml TID to provide 1884 kcal, 118 gm protein, 1069 ml free water daily.  NUTRITION DIAGNOSIS:   Inadequate oral intake related to inability to eat as evidenced by NPO status.  Ongoing  GOAL:   Patient will meet greater than or equal to 90% of their needs  Progressing  MONITOR:   Labs, I & O's, TF tolerance  REASON FOR ASSESSMENT:   Ventilator Enteral/tube feeding initiation and management  ASSESSMENT:   Pt admitted with acute onset of aphasia and left hemiplegia with occluded RT ICA, RT MCA, and RT ACA with complete revasuclaraztion.  Pt transferred to SDU on 11/09/16.   Pt transitioned back to trach collar.   Case discussed with CWOCN; pt with partial thickness moisture-related skin loss around stoma site and with DPTI on sacrum.   Pt continues on TF; noted Jevity 1.2 infusing via PEG at 55 ml/hr. Pt also recieivng 30 ml Prostat TID. Complete TF regimen providing 1884 kcals, 118 grams protein, and 1065 ml fluid (1365 ml fluid with 100 ml every 8 hour free water flush regimen), which provides 94% of estimated kcal needs and 100% of estimated protein needs  Labs reviewed: CBGS: 139-176.  Diet Order:  Diet NPO time specified  Skin:  Wound (see comment) (DPTI sacrum,partial thickness loss on stoma site)  Last BM:  11/09/16  Height:   Ht Readings from Last 1 Encounters:  11/10/16 5\' 7"  (1.702 m)    Weight:   Wt Readings from Last 1 Encounters:  11/11/16 185 lb (83.9 kg)    Ideal Body Weight:  80.9 kg  BMI:  Body mass index is 28.98 kg/m.  Estimated Nutritional Needs:   Kcal:  2000-2200  Protein:  105-120 grams  Fluid:  >2 L  EDUCATION NEEDS:   No education needs identified at this time  Mekesha Solomon A. Mayford KnifeWilliams, RD, LDN, CDE Pager: [phone] After hours Pager: [phone]

## 2016-11-11 NOTE — Consult Note (Signed)
WOC Nurse wound follow up Re-consulted for sacral wound due to evolution of wound Routine follow up for trach moisture associated skin damage. Wound type: Deep tissue pressure injury; sacrum, now with partial thickness skin loss from roof of bulla sloughing off Tracheostomy: moisture associated skin damage from copious tracheal secretions Measurement: unchanged  Wound bed: Sacrum 50% pink, partial thickness skin loss, 50% remains covered with bulla roof Peristomal trach tissue has improved with the use of hydroconductive split gauze dressing Drainage (amount, consistency, odor) sacrum: serosanguinous, scant amount Trach;copious, yellow/green with odor Periwound: intact Dressing procedure/placement/frequency: Continue soft silicone foam dressing for the sacrum to protect, insulate, for moist wound healing. Turn and reposition patient at least every 2 hours.  Check under sacral foam dressing every shift for skin changes. Low air loss mattress for pressure redistribution and microclimate management. Continue hydroconductive split gauze trach dressings for management of drainage.  WOC Nurse team will follow along with you for weekly wound assessments.  Please notify me of any acute changes in the wounds or any new areas of concerns [facility] Center[facility] MSN, RN,CWOCN, CNS [phone]

## 2016-11-12 DIAGNOSIS — I639 Cerebral infarction, unspecified: Secondary | ICD-10-CM | POA: Diagnosis present

## 2016-11-12 LAB — MAGNESIUM: MAGNESIUM: 2.2 mg/dL (ref 1.7–2.4)

## 2016-11-12 LAB — CULTURE, RESPIRATORY: CULTURE: NORMAL

## 2016-11-12 LAB — GLUCOSE, CAPILLARY
GLUCOSE-CAPILLARY: 134 mg/dL — AB (ref 65–99)
GLUCOSE-CAPILLARY: 145 mg/dL — AB (ref 65–99)
GLUCOSE-CAPILLARY: 158 mg/dL — AB (ref 65–99)
GLUCOSE-CAPILLARY: 173 mg/dL — AB (ref 65–99)
Glucose-Capillary: 132 mg/dL — ABNORMAL HIGH (ref 65–99)
Glucose-Capillary: 116 mg/dL — ABNORMAL HIGH (ref 65–99)

## 2016-11-12 LAB — CBC WITH DIFFERENTIAL/PLATELET
BASOS ABS: 0 10*3/uL (ref 0.0–0.1)
Basophils Relative: 0 %
EOS ABS: 0.3 10*3/uL (ref 0.0–0.7)
Eosinophils Relative: 4 %
HCT: 29.9 % — ABNORMAL LOW (ref 39.0–52.0)
HEMOGLOBIN: 9.3 g/dL — AB (ref 13.0–17.0)
LYMPHS ABS: 1.2 10*3/uL (ref 0.7–4.0)
LYMPHS PCT: 17 %
MCH: 27.7 pg (ref 26.0–34.0)
MCHC: 31.1 g/dL (ref 30.0–36.0)
MCV: 89 fL (ref 78.0–100.0)
Monocytes Absolute: 0.6 10*3/uL (ref 0.1–1.0)
Monocytes Relative: 8 %
NEUTROS PCT: 71 %
Neutro Abs: 5.1 10*3/uL (ref 1.7–7.7)
Platelets: 384 10*3/uL (ref 150–400)
RBC: 3.36 MIL/uL — AB (ref 4.22–5.81)
RDW: 14.6 % (ref 11.5–15.5)
WBC: 7.2 10*3/uL (ref 4.0–10.5)

## 2016-11-12 LAB — RENAL FUNCTION PANEL
ANION GAP: 6 (ref 5–15)
Albumin: 2.1 g/dL — ABNORMAL LOW (ref 3.5–5.0)
BUN: 13 mg/dL (ref 6–20)
CALCIUM: 8 mg/dL — AB (ref 8.9–10.3)
CO2: 28 mmol/L (ref 22–32)
CREATININE: 0.49 mg/dL — AB (ref 0.61–1.24)
Chloride: 106 mmol/L (ref 101–111)
Glucose, Bld: 133 mg/dL — ABNORMAL HIGH (ref 65–99)
Phosphorus: 3.5 mg/dL (ref 2.5–4.6)
Potassium: 4.2 mmol/L (ref 3.5–5.1)
SODIUM: 140 mmol/L (ref 135–145)

## 2016-11-12 LAB — CULTURE, RESPIRATORY W GRAM STAIN

## 2016-11-12 MED ORDER — APIXABAN 5 MG PO TABS
5.0000 mg | ORAL_TABLET | Freq: Two times a day (BID) | ORAL | Status: DC
  Administered 2016-11-12 – 2016-11-20 (×16): 5 mg via ORAL
  Filled 2016-11-12 (×16): qty 1

## 2016-11-12 NOTE — Progress Notes (Signed)
PT Cancellation Note  Patient Details Name: Sean [name] MRN: [id number] DOB: 1926/06/28   Cancelled Treatment:    Reason Eval/Treat Not Completed: Fatigue/lethargy limiting ability to participate. Unable to adequately arouse pt.   Sean [name] 11/12/2016, 2:35 PM [employer] CorporationCary [name] PT [phone]

## 2016-11-12 NOTE — Progress Notes (Signed)
PROGRESS NOTE    Sean AbedKong [name]  GLO:[id number] DOB: 01/02/1926 DOA: 10/23/2016 PCP: No primary care provider on file.   Brief Narrative:   82 y.o.  MANDARIN SPEAKING AM PMHx HTN, Atrial Fibrillation on Pradaxa (noncompliance with medication),  Presenting with left hemiplegia and speech difficulties. He did not receive IV t-PA due to anticoagulation. Had mechanical thrombectomy   Subjective: 11/30  A/O 0, withdraws to painful stimuli. Eyes open, follows some commands Tmax last 24 hours    Assessment & Plan:   Active Problems:   Cerebral embolism with cerebral infarction   Cerebrovascular accident (CVA) due to thrombosis of precerebral artery (HCC)   Acute respiratory failure with hypoxia (HCC)   Cerebrovascular accident (CVA) due to embolism of right carotid artery (HCC)   Encounter for intubation   Chronic a-fib (HCC)   Dysphagia   Acute blood loss anemia   Thrombocytopenia (HCC)   Benign essential HTN   Bradycardia   Pain   Ventilator dependence (HCC)   HCAP (healthcare-associated pneumonia)   Cerebral infarction due to embolism of right carotid artery (HCC)   Essential hypertension   Tracheostomy care (HCC)   Pressure injury of skin   Bleeding   Right MCA CVA due to R ICA collusion s/p mechanical thrombectomy  -Felt to be embolic in setting of afib w/ Pradaxa noncompliance - Patient for SNF when cleared by pulmonology. Neurology has artery cleared for SNF  -[facility] weeks  Acute Respiratory Failure secondary to CVA -Trach 11/16  - Mucomyst nebulizer QID + tracheal suction QID - on TC at time of exam today, thick purulent foul-smelling sputum continues however thin by Mucomyst  VAP/HCAP/Pulmonary Edema -Antibiotic therapy initiated by PCCM 11/17: Completed course of antibiotics -Tracheal aspirate on 11/17, 11/19, 11/28 normal respiratory flora   Chronic Afib -Noncompliant w/ Pradaxa - rate controlled at this  time  -Eliquis restarted per pharmacy  HTN -Controlled  HLD -LDL 53  -Per neurology niacin not resumed  Hypokalemia -Potassium goal > 4   Hypomagnesemia -Magnesium goal> 2   Dysphagia  -PEG 11/16  - continue tube feeds  Thrombocytopenia -unknown baseline, on chronic anticoagulation  -Resolved  Normocytic Anemia -No gross evidence of blood loss   Recent Labs Lab 11/09/16 0403 11/10/16 0424 11/10/16 0618 11/11/16 0522 11/12/16 0318  HGB 9.7* 8.7* 9.9* 9.3* 9.3*  -stable  Bleeding around Tracheostomy -Resolved  Prediabetes  -A1c 6.2 - consistent with prediabetic state -Sensitive SSI  Prolonged QT interval -11/26 QT interval 482 -Repeat EKG on 11/30   Goals of care -Consult Palliative Care: Setup family meeting on Saturday or Sunday with Mandarin speaking translator, discuss CODE STATUS, short-term vs long-term goals of care. Recovery will be limited. LTAC?     DVT prophylaxis: Eliquis  Code Status: Full Family Communication: Wife present Disposition Plan: Vent SNF   Consultants:  PCCM Neurology    Procedures/Significant Events:  11/10 admit w/ acute CVA - to IR for recanalization of R ICA - intubated 11/13 Echocardiogram:Left ventricle: focal   basal hypertrophy of the septum. -LVEF =-55% to 60%. - Tricuspid valve: moderate regurgitation. - Pulmonary arteries:  PA peak pressure: 32 mm Hg (S). 11/16 tracheostomy and PEG placed  11/20 TRH assumed cre  11/24 - Oozing around tracheostomy on 11/24 on systemic anticoagulation 11/24 transfuse 1u PRBC   VENTILATOR SETTINGS:    Cultures 11/10 MRSA by PCR negative 11/10 blood 2 negative 11/16 urine negative 11/16 tracheal aspirate normal  flora 11/19 tracheal aspirate normal flora 11/28 tracheal aspirate normal flora   Antimicrobials: Ceftriaxone 11/17 >>11/22 Ciprofloxacin eyedrops 11/21>> Vancomycin 11/26>> 11/30   Devices    LINES / TUBES:  ET tube 11/10 - 11/16 Left  Radial Aline 11/10 - out R Femoral Sheath 11/10 - 11/11 Trach 11/16>> PEG 11/16>>    Continuous Infusions: . sodium chloride 10 mL/hr at 11/12/16 1500  . feeding supplement (JEVITY 1.2 CAL) 1,000 mL (11/12/16 1500)     Objective: Vitals:   11/12/16 1512 11/12/16 1600 11/12/16 1624 11/12/16 1700  BP: (!) 90/55 (!) 108/44 (!) 102/57 (!) 92/51  Pulse: 72 73 70 70  Resp: 20 15 16 17   Temp:   98.1 F (36.7 C)   TempSrc:   Oral   SpO2: 98% 98% 95% 99%  Weight:      Height:        Intake/Output Summary (Last 24 hours) at 11/12/16 1929 Last data filed at 11/12/16 1516  Gross per 24 hour  Intake             1610 ml  Output              905 ml  Net              705 ml   Filed Weights   11/10/16 0341 11/11/16 0313 11/12/16 0500  Weight: 82.6 kg (182 lb) 83.9 kg (185 lb) 85.3 kg (188 lb)    Examination:  General:  A/O 0, Eyes open,,Withdraws to painful stimuli, Follows some commands, No acute respiratory distress Eyes: negative scleral hemorrhage, positive anisocoria, negative icterus ENT: Negative Runny nose, negative gingival bleeding, Neck:  Negative scars, masses, torticollis, lymphadenopathy, JVD Lungs: diffuse rhonchi, thick yellow-green purulent (foul-smelling) sputum from trach tube  Cardiovascular: Irregular irregular rhythm and rate, without murmur gallop or rub normal S1 and S2 Abdomen: negative abdominal pain, nondistended, positive soft, bowel sounds, no rebound, no ascites, no appreciable mass Extremities: No significant cyanosis, clubbing, or edema bilateral lower extremities Skin: Negative rashes, lesions, ulcers Psychiatric:  Unable to evaluate  Central nervous system:  Unable to evaluate other than patient withdraws to painful stimuli, positive anisocoria .     Data Reviewed: Care during the described time interval was provided by me .  I have reviewed this patient's available data, including medical history, events of note, physical examination, and all  test results as part of my evaluation. I have personally reviewed and interpreted all radiology studies.  CBC:  Recent Labs Lab 11/09/16 0403 11/10/16 0424 11/10/16 0618 11/11/16 0522 11/12/16 0318  WBC 8.3 6.7 7.5 7.8 7.2  NEUTROABS 6.1 5.0 5.5 5.4 5.1  HGB 9.7* 8.7* 9.9* 9.3* 9.3*  HCT 29.5* 27.8* 31.3* 29.0* 29.9*  MCV 89.1 90.3 89.2 89.2 89.0  PLT 357 214 367 385 384   Basic Metabolic Panel:  Recent Labs Lab 11/08/16 0400  11/09/16 0403 11/10/16 0424 11/10/16 0618 11/11/16 0522 11/12/16 0318  NA  --   < > 139 144 139 139 140  K  --   < > 3.5 2.6* 3.9 3.9 4.2  CL  --   < > 106 120* 106 107 106  CO2  --   < > 23 20* 25 25 28   GLUCOSE  --   < > 176* 97 177* 179* 133*  BUN  --   < > 25* 12 17 15 13   CREATININE  --   < > 0.56* 0.31* 0.44* 0.56* 0.49*  CALCIUM  --   < >  8.0* 5.6* 8.1* 7.8* 8.0*  MG 2.3  --  2.4 1.6*  --  2.4 2.2  PHOS  --   < > 4.2 2.7 3.5 3.4 3.5  < > = values in this interval not displayed. GFR: Estimated Creatinine Clearance: 66.6 mL/min (by C-G formula based on SCr of 0.49 mg/dL (L)). Liver Function Tests:  Recent Labs Lab 11/09/16 0403 11/10/16 0424 11/10/16 0618 11/11/16 0522 11/12/16 0318  ALBUMIN 2.2* 1.5* 2.1* 2.0* 2.1*   No results for input(s): LIPASE, AMYLASE in the last 168 hours. No results for input(s): AMMONIA in the last 168 hours. Coagulation Profile:  Recent Labs Lab 11/06/16 0831  INR 1.39   Cardiac Enzymes: No results for input(s): CKTOTAL, CKMB, CKMBINDEX, TROPONINI in the last 168 hours. BNP (last 3 results) No results for input(s): PROBNP in the last 8760 hours. HbA1C: No results for input(s): HGBA1C in the last 72 hours. CBG:  Recent Labs Lab 11/11/16 2024 11/11/16 2353 11/12/16 0353 11/12/16 0832 11/12/16 1621  GLUCAP 186* 158* 134* 173* 132*   Lipid Profile: No results for input(s): CHOL, HDL, LDLCALC, TRIG, CHOLHDL, LDLDIRECT in the last 72 hours. Thyroid Function Tests: No results for  input(s): TSH, T4TOTAL, FREET4, T3FREE, THYROIDAB in the last 72 hours. Anemia Panel: No results for input(s): VITAMINB12, FOLATE, FERRITIN, TIBC, IRON, RETICCTPCT in the last 72 hours. Urine analysis:    Component Value Date/Time   COLORURINE AMBER (A) 10/29/2016 1330   APPEARANCEUR TURBID (A) 10/29/2016 1330   LABSPEC 1.024 10/29/2016 1330   PHURINE 8.5 (H) 10/29/2016 1330   GLUCOSEU NEGATIVE 10/29/2016 1330   HGBUR NEGATIVE 10/29/2016 1330   BILIRUBINUR MODERATE (A) 10/29/2016 1330   KETONESUR NEGATIVE 10/29/2016 1330   PROTEINUR 100 (A) 10/29/2016 1330   NITRITE POSITIVE (A) 10/29/2016 1330   LEUKOCYTESUR SMALL (A) 10/29/2016 1330   Sepsis Labs: @LABRCNTIP (procalcitonin:4,lacticidven:4)  ) Recent Results (from the past 240 hour(s))  Culture, respiratory (NON-Expectorated)     Status: None   Collection Time: 11/10/16 10:53 PM  Result Value Ref Range Status   Specimen Description TRACHEAL ASPIRATE  Final   Special Requests NONE  Final   Gram Stain   Final    FEW WBC PRESENT,BOTH PMN AND MONONUCLEAR MODERATE GRAM NEGATIVE RODS FEW GRAM POSITIVE RODS FEW GRAM POSITIVE COCCI IN PAIRS    Culture Consistent with normal respiratory flora.  Final   Report Status 11/12/2016 FINAL  Final         Radiology Studies: Dg Chest Port 1 View  Result Date: 11/11/2016 CLINICAL DATA:  Pneumonia. EXAM: PORTABLE CHEST 1 VIEW COMPARISON:  Radiograph of November 06, 2016. FINDINGS: Stable cardiomegaly. Tracheostomy tube is unchanged in position. No pneumothorax is noted. Mild bibasilar subsegmental atelectasis or infiltrates are noted. Bony thorax is unremarkable. IMPRESSION: Increase bibasilar opacities are noted concerning for worsening atelectasis or possibly infiltrates. Electronically Signed   By: Lupita RaiderJames  Green Jr, M.D.   On: 11/11/2016 07:38        Scheduled Meds: . acetylcysteine  4 mL Nebulization QID  . apixaban  5 mg Oral BID  . bacitracin-polymyxin b   Right Eye QHS  .  chlorhexidine  15 mL Mouth Rinse BID  . ciprofloxacin  2 drop Right Eye TID AC  . feeding supplement (PRO-STAT SUGAR FREE 64)  30 mL Per Tube TID  . free water  100 mL Per Tube Q8H  . hydrALAZINE  50 mg Per Tube Q8H  . insulin aspart  0-15 Units Subcutaneous Q4H  .  magnesium sulfate 1 - 4 g bolus IVPB  4 g Intravenous Once  . mouth rinse  15 mL Mouth Rinse 10 times per day  . pantoprazole sodium  40 mg Per Tube Daily  . potassium chloride  40 mEq Per Tube Once  . potassium chloride (KCL MULTIRUN) 30 mEq in 265 mL IVPB  30 mEq Intravenous Once  . sennosides  5 mL Per Tube BID   Continuous Infusions: . sodium chloride 10 mL/hr at 11/12/16 1500  . feeding supplement (JEVITY 1.2 CAL) 1,000 mL (11/12/16 1500)     LOS: 20 days    Time spent: 40 minutes    Solange Emry, Roselind Messier, MD [facility] Pager [phone]   If 7PM-7AM, please contact night-coverage [email or web address] Password [email or web address] 11/12/2016, 7:28 PM

## 2016-11-13 DIAGNOSIS — J95851 Ventilator associated pneumonia: Secondary | ICD-10-CM | POA: Diagnosis present

## 2016-11-13 LAB — CBC WITH DIFFERENTIAL/PLATELET
BASOS PCT: 1 %
Basophils Absolute: 0.1 10*3/uL (ref 0.0–0.1)
EOS ABS: 0.3 10*3/uL (ref 0.0–0.7)
Eosinophils Relative: 4 %
HEMATOCRIT: 30.8 % — AB (ref 39.0–52.0)
HEMOGLOBIN: 9.8 g/dL — AB (ref 13.0–17.0)
LYMPHS ABS: 1.1 10*3/uL (ref 0.7–4.0)
LYMPHS PCT: 16 %
MCH: 28.4 pg (ref 26.0–34.0)
MCHC: 31.8 g/dL (ref 30.0–36.0)
MCV: 89.3 fL (ref 78.0–100.0)
Monocytes Absolute: 0.4 10*3/uL (ref 0.1–1.0)
Monocytes Relative: 6 %
NEUTROS ABS: 4.7 10*3/uL (ref 1.7–7.7)
Neutrophils Relative %: 73 %
Platelets: 334 10*3/uL (ref 150–400)
RBC: 3.45 MIL/uL — ABNORMAL LOW (ref 4.22–5.81)
RDW: 15 % (ref 11.5–15.5)
WBC: 6.6 10*3/uL (ref 4.0–10.5)

## 2016-11-13 LAB — RENAL FUNCTION PANEL
ALBUMIN: 2.2 g/dL — AB (ref 3.5–5.0)
ANION GAP: 11 (ref 5–15)
BUN: 13 mg/dL (ref 6–20)
CALCIUM: 8 mg/dL — AB (ref 8.9–10.3)
CO2: 24 mmol/L (ref 22–32)
Chloride: 103 mmol/L (ref 101–111)
Creatinine, Ser: 0.58 mg/dL — ABNORMAL LOW (ref 0.61–1.24)
GLUCOSE: 141 mg/dL — AB (ref 65–99)
PHOSPHORUS: 3.9 mg/dL (ref 2.5–4.6)
Potassium: 4.6 mmol/L (ref 3.5–5.1)
SODIUM: 138 mmol/L (ref 135–145)

## 2016-11-13 LAB — GLUCOSE, CAPILLARY
GLUCOSE-CAPILLARY: 101 mg/dL — AB (ref 65–99)
Glucose-Capillary: 140 mg/dL — ABNORMAL HIGH (ref 65–99)
Glucose-Capillary: 148 mg/dL — ABNORMAL HIGH (ref 65–99)
Glucose-Capillary: 143 mg/dL — ABNORMAL HIGH (ref 65–99)
Glucose-Capillary: 109 mg/dL — ABNORMAL HIGH (ref 65–99)
Glucose-Capillary: 217 mg/dL — ABNORMAL HIGH (ref 65–99)

## 2016-11-13 LAB — MAGNESIUM: MAGNESIUM: 2.2 mg/dL (ref 1.7–2.4)

## 2016-11-13 NOTE — Care Management Important Message (Signed)
Important Message  Patient Details  Name: Sean [name] MRN: [id number] Date of Birth: Jun 02, 1926   Medicare Important Message Given:  Yes    Keylin Ferryman Abena 11/13/2016, 1:02 PM

## 2016-11-13 NOTE — Progress Notes (Signed)
Palliative Medicine consult noted. Due to high referral volume, there may be a delay seeing this patient. Please call the Palliative Medicine Team office at [phone] if recommendations are needed in the interim.  Thank you for inviting us to see this patient.  Unnamed Hino G. Dale Ribeiro, RN, BSN, CHPN 11/13/2016 7:54 AM Cell [phone] 8:00-4:00 Monday-Friday Office [phone] 

## 2016-11-13 NOTE — Progress Notes (Signed)
Physical Therapy Treatment Patient Details Name: Sean [name] Surprenant MRN: [id number] DOB: August 02, 1926 Today's Date: 11/13/2016    History of Present Illness Sean [name] Hidrogo is a 82 y.o. male admitted with He has expressive aphasia and Not moving left side with right-sided gaze deviation. MRI: Areas of acute infarction in the right MCA territory involving the insula, frontal operculum, anteromedial temporal lobe, and lateral temporal parietal junction s/p revascularization.    PT Comments    Patient seen for therapeutic activity. Perform repositioning at EOB with focus of sessio on trunk control and facilitation of sitting balance. PROM performed BLEs but could not perform UE as patient resisting. COntinues to show inattention to the left despite multi modal activity. Family present during session. Patient repsonding well to wife and son. Will continue to see and progress as tolerated.  Follow Up Recommendations  SNF     Equipment Recommendations  None recommended by PT    Recommendations for Other Services OT consult;Rehab consult     Precautions / Restrictions Precautions Precautions: Fall Precaution Comments: trach, peg, condom cath Restrictions Weight Bearing Restrictions: No    Mobility  Bed Mobility Overal bed mobility: Needs Assistance;+2 for physical assistance Bed Mobility: Rolling;Supine to Sit;Sit to Supine     Supine to sit: Total assist;+2 for physical assistance Sit to supine: Total assist;+2 for physical assistance   General bed mobility comments: patient still unable to assist with mobility  Transfers                    Ambulation/Gait                 Stairs            Wheelchair Mobility    Modified Rankin (Stroke Patients Only) Modified Rankin (Stroke Patients Only) Pre-Morbid Rankin Score: Moderately severe disability Modified Rankin: Severe disability     Balance     Sitting balance-Leahy Scale: Poor Sitting balance - Comments:  patient with lateral lean noted at EOB improved with trunk control activity. initially moderate assist for sitting balance but then able to progress to min guard assist with multimodal cues                             Cognition Arousal/Alertness: Awake/alert Behavior During Therapy: Flat affect Overall Cognitive Status: Difficult to assess Area of Impairment: Following commands;Safety/judgement;Problem solving       Following Commands: Follows one step commands inconsistently;Follows one step commands with increased time Safety/Judgement: Decreased awareness of safety;Decreased awareness of deficits   Problem Solving: Slow processing;Decreased initiation;Requires verbal cues;Requires tactile cues General Comments: Family present (wife and son from out of state). With asking them to ask him to do things in Cong[place]inese pt gave a thumbs up, thumbs down, removed a wash cloth from his face, moved his right leg, waved by to me. When asked if his name was Brett CanalesSteve he shook his head no, but then he would not shake his head yes or no to other names. When asked if he was tired he shook his head yes. When asked if wanted me to go bye bye (in [place]) he shook his head yes and mouthed bye bye.    Exercises Other Exercises Other Exercises: Trunk contol activities perform EOB Other Exercises: Facilitated trunk flexion and extension Other Exercises: performed lateral leans with positioning on elbows with assist Other Exercises: PROM perform BLEs and attempted UEs but patient resisting    General Comments  Pertinent Vitals/Pain Pain Assessment: Faces Faces Pain Scale: Hurts little more Pain Location: with repositioning and PROM  Pain Descriptors / Indicators: Grimacing;Guarding Pain Intervention(s): Monitored during session    Home Living                      Prior Function            PT Goals (current goals can now be found in the care plan section) Acute Rehab PT  Goals Patient Stated Goal: unable PT Goal Formulation: Patient unable to participate in goal setting Time For Goal Achievement: 11/09/16 Potential to Achieve Goals: Fair Progress towards PT goals: Progressing toward goals    Frequency    Min 3X/week      PT Plan Current plan remains appropriate    Co-evaluation             End of Session Equipment Utilized During Treatment: Other (comment) (trach collar) Activity Tolerance: Patient tolerated treatment well Patient left: in bed;with call bell/phone within reach;with bed alarm set     Time: 1141-1159 PT Time Calculation (min) (ACUTE ONLY): 18 min  Charges:  $Therapeutic Activity: 8-22 mins                    G Codes:      Fabio AsaDevon J Shaeley Segall 11/13/2016, 3:13 PM  Charlotte Crumbevon Lasasha Brophy, PT DPT  [phone]

## 2016-11-13 NOTE — Progress Notes (Signed)
Pt on trach collar. Have made sw ref for poss trach snf. Will cont to follow.

## 2016-11-13 NOTE — Plan of Care (Signed)
Problem: Education: Goal: Knowledge of patient specific risk factors addressed and post discharge goals established will improve Outcome: Progressing Educating wife with help of nephew.  Verbalized understanding of risk factors   Problem: Nutrition: Goal: Dietary intake will improve Outcome: Progressing Tolerates tf   Problem: Education: Goal: Knowledge of disease or condition will improve Outcome: Progressing wifes knowledge progressing with assistance of nephew   Problem: Nutrition: Goal: Dietary intake will improve Outcome: Progressing Tolerates tf

## 2016-11-13 NOTE — Progress Notes (Signed)
PROGRESS NOTE    Sean [name]  ZOX:[id number] DOB: January 01, 1926 DOA: 10/23/2016 PCP: No primary care provider on file.   Brief Narrative:   82 y.o.  MANDARIN SPEAKING AM PMHx HTN, Atrial Fibrillation on Pradaxa (noncompliance with medication),  Presenting with left hemiplegia and speech difficulties. He did not receive IV t-PA due to anticoagulation. Had mechanical thrombectomy   Subjective: 12/1  A/O 0, withdraws to painful stimuli. Eyes open, follows commands afebrile last 24 hours    Assessment & Plan:   Active Problems:   Cerebral embolism with cerebral infarction   Cerebrovascular accident (CVA) due to thrombosis of precerebral artery (HCC)   Acute respiratory failure with hypoxia (HCC)   Cerebrovascular accident (CVA) due to embolism of right carotid artery (HCC)   Encounter for intubation   Chronic a-fib (HCC)   Dysphagia   Acute blood loss anemia   Thrombocytopenia (HCC)   Benign essential HTN   Bradycardia   Pain   Ventilator dependence (HCC)   HCAP (healthcare-associated pneumonia)   Cerebral infarction due to embolism of right carotid artery (HCC)   Essential hypertension   Tracheostomy care (HCC)   Pressure injury of skin   Bleeding   Cerebrovascular accident (CVA) (HCC)   Right MCA CVA due to R ICA collusion s/p mechanical thrombectomy  -Felt to be embolic in setting of afib w/ Pradaxa noncompliance - Patient for SNF when cleared by pulmonology. Neurology has artery cleared for SNF  -Scheduled follow up with Dr. [facility] Plan at [facility] in about 6 weeks  Acute Respiratory Failure secondary to CVA -Trach 11/16  - Mucomyst nebulizer QID + tracheal suction QID - on TC at time of exam today, thick purulent foul-smelling sputum continues however thined by Mucomyst  VAP/HCAP/Pulmonary Edema -Antibiotic therapy initiated by PCCM 11/17: Completed course of antibiotics -Tracheal aspirate on 11/17, 11/19, 11/28 normal respiratory flora   Chronic Afib -Noncompliant  w/ Pradaxa - rate controlled at this time  -Eliquis restarted per pharmacy  HTN -Controlled  HLD -LDL 53  -Per neurology niacin not resumed  Hypokalemia -Potassium goal > 4   Hypomagnesemia -Magnesium goal> 2  Dysphagia  -PEG 11/16  - continue tube feeds  Thrombocytopenia -unknown baseline, on chronic anticoagulation  -Resolved  Normocytic Anemia -No gross evidence of blood loss   Recent Labs Lab 11/09/16 0403 11/10/16 0424 11/10/16 0618 11/11/16 0522 11/12/16 0318  HGB 9.7* 8.7* 9.9* 9.3* 9.3*  -stable  Bleeding around Tracheostomy -Resolved  Prediabetes  -A1c 6.2 - consistent with prediabetic state -Sensitive SSI  Prolonged QT interval -11/26 QT interval 482 -Repeat EKG on 11/30   Goals of care -Consult Palliative Care: Setup family meeting on Saturday or Sunday with Mandarin speaking translator, discuss CODE STATUS, short-term vs long-term goals of care. Recovery will be limited. LTAC? -Palliative care has arranged family meeting with translator for Sunday at 1100     DVT prophylaxis: Eliquis  Code Status: Full Family Communication: Wife present Disposition Plan: Vent SNF   Consultants:  PCCM Neurology    Procedures/Significant Events:  11/10 admit w/ acute CVA - to IR for recanalization of R ICA - intubated 11/13 Echocardiogram:Left ventricle: focal   basal hypertrophy of the septum. -LVEF =-55% to 60%. - Tricuspid valve: moderate regurgitation. - Pulmonary arteries:  PA peak pressure: 32 mm Hg (S). 11/16 tracheostomy and PEG placed  11/20 TRH assumed cre  11/24 - Oozing around tracheostomy on 11/24 on systemic anticoagulation 11/24 transfuse 1u PRBC   VENTILATOR SETTINGS:    Cultures  11/10 MRSA by PCR negative 11/10 blood 2 negative 11/16 urine negative 11/16 tracheal aspirate normal flora 11/19 tracheal aspirate normal flora 11/28 tracheal aspirate normal flora   Antimicrobials: Ceftriaxone 11/17  >>11/22 Ciprofloxacin eyedrops 11/21>> Vancomycin 11/26>> 11/30   Devices    LINES / TUBES:  ET tube 11/10 - 11/16 Left Radial Aline 11/10 - out R Femoral Sheath 11/10 - 11/11 Trach 11/16>> PEG 11/16>>    Continuous Infusions: . sodium chloride 10 mL/hr at 11/12/16 1500  . feeding supplement (JEVITY 1.2 CAL) 1,000 mL (11/12/16 1500)     Objective: Vitals:   11/13/16 0600 11/13/16 0700 11/13/16 0807 11/13/16 0811  BP: (!) 131/93 136/85 136/61 136/61  Pulse: 93 90 89 93  Resp: 18 18 (!) 24 (!) 21  Temp:   98.3 F (36.8 C)   TempSrc:   Axillary   SpO2: 96% 96% 98% 100%  Weight:      Height:        Intake/Output Summary (Last 24 hours) at 11/13/16 1610 Last data filed at 11/13/16 0600  Gross per 24 hour  Intake           807.33 ml  Output             1080 ml  Net          -272.67 ml   Filed Weights   11/10/16 0341 11/11/16 0313 11/12/16 0500  Weight: 82.6 kg (182 lb) 83.9 kg (185 lb) 85.3 kg (188 lb)    Examination:  General:  A/O 0, Eyes open,,Withdraws to painful stimuli, Follows commands, No acute respiratory distress Eyes: negative scleral hemorrhage, positive anisocoria, negative icterus ENT: Negative Runny nose, negative gingival bleeding, Neck:  Negative scars, masses, torticollis, lymphadenopathy, JVD Lungs: diffuse rhonchi, thick yellow-green purulent (foul-smelling) sputum from trach tube  Cardiovascular: Irregular irregular rhythm and rate, without murmur gallop or rub normal S1 and S2 Abdomen: negative abdominal pain, nondistended, positive soft, bowel sounds, no rebound, no ascites, no appreciable mass Extremities: No significant cyanosis, clubbing, or edema bilateral lower extremities Skin: Negative rashes, lesions, ulcers Psychiatric:  Unable to evaluate  Central nervous system:  Unable to evaluate other than patient withdraws to painful stimuli, positive anisocoria, moves R UE to command, and bilateral lower extremities to command. Per nursing  has also mouthed answers to simple questions.  .     Data Reviewed: Care during the described time interval was provided by me .  I have reviewed this patient's available data, including medical history, events of note, physical examination, and all test results as part of my evaluation. I have personally reviewed and interpreted all radiology studies.  CBC:  Recent Labs Lab 11/09/16 0403 11/10/16 0424 11/10/16 0618 11/11/16 0522 11/12/16 0318  WBC 8.3 6.7 7.5 7.8 7.2  NEUTROABS 6.1 5.0 5.5 5.4 5.1  HGB 9.7* 8.7* 9.9* 9.3* 9.3*  HCT 29.5* 27.8* 31.3* 29.0* 29.9*  MCV 89.1 90.3 89.2 89.2 89.0  PLT 357 214 367 385 384   Basic Metabolic Panel:  Recent Labs Lab 11/08/16 0400  11/09/16 0403 11/10/16 0424 11/10/16 0618 11/11/16 0522 11/12/16 0318  NA  --   < > 139 144 139 139 140  K  --   < > 3.5 2.6* 3.9 3.9 4.2  CL  --   < > 106 120* 106 107 106  CO2  --   < > 23 20* 25 25 28   GLUCOSE  --   < > 176* 97 177* 179* 133*  BUN  --   < >  25* 12 17 15 13   CREATININE  --   < > 0.56* 0.31* 0.44* 0.56* 0.49*  CALCIUM  --   < > 8.0* 5.6* 8.1* 7.8* 8.0*  MG 2.3  --  2.4 1.6*  --  2.4 2.2  PHOS  --   < > 4.2 2.7 3.5 3.4 3.5  < > = values in this interval not displayed. GFR: Estimated Creatinine Clearance: 66.6 mL/min (by C-G formula based on SCr of 0.49 mg/dL (L)). Liver Function Tests:  Recent Labs Lab 11/09/16 0403 11/10/16 0424 11/10/16 0618 11/11/16 0522 11/12/16 0318  ALBUMIN 2.2* 1.5* 2.1* 2.0* 2.1*   No results for input(s): LIPASE, AMYLASE in the last 168 hours. No results for input(s): AMMONIA in the last 168 hours. Coagulation Profile: No results for input(s): INR, PROTIME in the last 168 hours. Cardiac Enzymes: No results for input(s): CKTOTAL, CKMB, CKMBINDEX, TROPONINI in the last 168 hours. BNP (last 3 results) No results for input(s): PROBNP in the last 8760 hours. HbA1C: No results for input(s): HGBA1C in the last 72 hours. CBG:  Recent Labs Lab  11/12/16 1621 11/12/16 2009 11/12/16 2356 11/13/16 0423 11/13/16 0805  GLUCAP 132* 116* 145* 101* 148*   Lipid Profile: No results for input(s): CHOL, HDL, LDLCALC, TRIG, CHOLHDL, LDLDIRECT in the last 72 hours. Thyroid Function Tests: No results for input(s): TSH, T4TOTAL, FREET4, T3FREE, THYROIDAB in the last 72 hours. Anemia Panel: No results for input(s): VITAMINB12, FOLATE, FERRITIN, TIBC, IRON, RETICCTPCT in the last 72 hours. Urine analysis:    Component Value Date/Time   COLORURINE AMBER (A) 10/29/2016 1330   APPEARANCEUR TURBID (A) 10/29/2016 1330   LABSPEC 1.024 10/29/2016 1330   PHURINE 8.5 (H) 10/29/2016 1330   GLUCOSEU NEGATIVE 10/29/2016 1330   HGBUR NEGATIVE 10/29/2016 1330   BILIRUBINUR MODERATE (A) 10/29/2016 1330   KETONESUR NEGATIVE 10/29/2016 1330   PROTEINUR 100 (A) 10/29/2016 1330   NITRITE POSITIVE (A) 10/29/2016 1330   LEUKOCYTESUR SMALL (A) 10/29/2016 1330   Sepsis Labs: @LABRCNTIP (procalcitonin:4,lacticidven:4)  ) Recent Results (from the past 240 hour(s))  Culture, respiratory (NON-Expectorated)     Status: None   Collection Time: 11/10/16 10:53 PM  Result Value Ref Range Status   Specimen Description TRACHEAL ASPIRATE  Final   Special Requests NONE  Final   Gram Stain   Final    FEW WBC PRESENT,BOTH PMN AND MONONUCLEAR MODERATE GRAM NEGATIVE RODS FEW GRAM POSITIVE RODS FEW GRAM POSITIVE COCCI IN PAIRS    Culture Consistent with normal respiratory flora.  Final   Report Status 11/12/2016 FINAL  Final         Radiology Studies: No results found.      Scheduled Meds: . acetylcysteine  4 mL Nebulization QID  . apixaban  5 mg Oral BID  . bacitracin-polymyxin b   Right Eye QHS  . chlorhexidine  15 mL Mouth Rinse BID  . ciprofloxacin  2 drop Right Eye TID AC  . feeding supplement (PRO-STAT SUGAR FREE 64)  30 mL Per Tube TID  . free water  100 mL Per Tube Q8H  . hydrALAZINE  50 mg Per Tube Q8H  . insulin aspart  0-15 Units  Subcutaneous Q4H  . magnesium sulfate 1 - 4 g bolus IVPB  4 g Intravenous Once  . mouth rinse  15 mL Mouth Rinse 10 times per day  . pantoprazole sodium  40 mg Per Tube Daily  . potassium chloride  40 mEq Per Tube Once  . potassium chloride (KCL  MULTIRUN) 30 mEq in 265 mL IVPB  30 mEq Intravenous Once  . sennosides  5 mL Per Tube BID   Continuous Infusions: . sodium chloride 10 mL/hr at 11/12/16 1500  . feeding supplement (JEVITY 1.2 CAL) 1,000 mL (11/12/16 1500)     LOS: 21 days    Time spent: 40 minutes    Chenille Toor, Roselind MessierURTIS J, MD [facility] Pager [phone]   If 7PM-7AM, please contact night-coverage [email or web address] Password [facility] 11/13/2016, 8:33 AM

## 2016-11-13 NOTE — Progress Notes (Addendum)
Palliative Medicine RN Note:  Spoke with office of inclusion; Mandarin interpreter available for only a few hours Sunday. Called son to try to set up meeting; no answer at home number and cell phone VM has not been set up. Will need to set up meeting time soon, as Office of Inclusion needs to coordinate translator. Will continue to reach out to family.  Marjie Skiff Alese Furniss, RN, BSN, [facility] 11/13/2016 11:42 AM Cell [phone] 8:00-4:00 Monday-Friday Office [phone]   ADDENDUM: met with grandson in room. He is bilingual; discussed our obligation to provide medical interpreter; he verbalized understanding. Meeting set up for Sunday at 1100. Coordinated with Adria in Office of Inclusion.  Marjie Skiff Eureka Valdes, RN, BSN, [facility] 11/13/2016 12:00 PM Cell [phone] 8:00-4:00 Monday-Friday Office [phone]

## 2016-11-14 DIAGNOSIS — R9431 Abnormal electrocardiogram [ECG] [EKG]: Secondary | ICD-10-CM

## 2016-11-14 DIAGNOSIS — E784 Other hyperlipidemia: Secondary | ICD-10-CM

## 2016-11-14 DIAGNOSIS — R1311 Dysphagia, oral phase: Secondary | ICD-10-CM

## 2016-11-14 DIAGNOSIS — J810 Acute pulmonary edema: Secondary | ICD-10-CM

## 2016-11-14 LAB — CBC WITH DIFFERENTIAL/PLATELET
Basophils Absolute: 0 10*3/uL (ref 0.0–0.1)
Basophils Relative: 1 %
EOS ABS: 0.3 10*3/uL (ref 0.0–0.7)
EOS PCT: 4 %
HCT: 30.3 % — ABNORMAL LOW (ref 39.0–52.0)
Hemoglobin: 9.6 g/dL — ABNORMAL LOW (ref 13.0–17.0)
LYMPHS ABS: 1.2 10*3/uL (ref 0.7–4.0)
Lymphocytes Relative: 19 %
MCH: 27.7 pg (ref 26.0–34.0)
MCHC: 31.7 g/dL (ref 30.0–36.0)
MCV: 87.3 fL (ref 78.0–100.0)
MONO ABS: 0.5 10*3/uL (ref 0.1–1.0)
MONOS PCT: 9 %
Neutro Abs: 4.2 10*3/uL (ref 1.7–7.7)
Neutrophils Relative %: 67 %
PLATELETS: 368 10*3/uL (ref 150–400)
RBC: 3.47 MIL/uL — AB (ref 4.22–5.81)
RDW: 14.6 % (ref 11.5–15.5)
WBC: 6.2 10*3/uL (ref 4.0–10.5)

## 2016-11-14 LAB — RENAL FUNCTION PANEL
Albumin: 2.1 g/dL — ABNORMAL LOW (ref 3.5–5.0)
Anion gap: 7 (ref 5–15)
BUN: 14 mg/dL (ref 6–20)
CALCIUM: 8.1 mg/dL — AB (ref 8.9–10.3)
CHLORIDE: 106 mmol/L (ref 101–111)
CO2: 26 mmol/L (ref 22–32)
CREATININE: 0.57 mg/dL — AB (ref 0.61–1.24)
GFR calc Af Amer: >60 mL/min (ref >60)
GFR calc non Af Amer: >60 mL/min (ref >60)
GLUCOSE: 113 mg/dL — AB (ref 65–99)
Phosphorus: 3.7 mg/dL (ref 2.5–4.6)
Potassium: 4 mmol/L (ref 3.5–5.1)
SODIUM: 139 mmol/L (ref 135–145)

## 2016-11-14 LAB — GLUCOSE, CAPILLARY
GLUCOSE-CAPILLARY: 110 mg/dL — AB (ref 65–99)
GLUCOSE-CAPILLARY: 177 mg/dL — AB (ref 65–99)
GLUCOSE-CAPILLARY: 150 mg/dL — AB (ref 65–99)
GLUCOSE-CAPILLARY: 170 mg/dL — AB (ref 65–99)
Glucose-Capillary: 135 mg/dL — ABNORMAL HIGH (ref 65–99)
Glucose-Capillary: 152 mg/dL — ABNORMAL HIGH (ref 65–99)
Glucose-Capillary: 120 mg/dL — ABNORMAL HIGH (ref 65–99)

## 2016-11-14 LAB — MAGNESIUM: Magnesium: 2.3 mg/dL (ref 1.7–2.4)

## 2016-11-14 NOTE — Progress Notes (Addendum)
PROGRESS NOTE    Sean [name]  ZOX:[id number] DOB: 1926/02/10 DOA: 10/23/2016 PCP: No primary care provider on file.   Brief Narrative:   82 y.o.  MANDARIN SPEAKING AM PMHx HTN, Atrial Fibrillation on Pradaxa (noncompliance with medication),  Presenting with left hemiplegia and speech difficulties. He did not receive IV t-PA due to anticoagulation. Had mechanical thrombectomy   Subjective: 12/2  A/O 0, withdraws to painful stimuli. Eyes open, follows commands afebrile last 24 hours    Assessment & Plan:   Active Problems:   Cerebral embolism with cerebral infarction   Cerebrovascular accident (CVA) due to thrombosis of precerebral artery (HCC)   Acute respiratory failure with hypoxia (HCC)   Cerebrovascular accident (CVA) due to embolism of right carotid artery (HCC)   Encounter for intubation   Chronic a-fib (HCC)   Dysphagia   Acute blood loss anemia   Thrombocytopenia (HCC)   Benign essential HTN   Bradycardia   Pain   Ventilator dependence (HCC)   HCAP (healthcare-associated pneumonia)   Cerebral infarction due to embolism of right carotid artery (HCC)   Essential hypertension   Tracheostomy care (HCC)   Pressure injury of skin   Bleeding   Cerebrovascular accident (CVA) (HCC)   VAP (ventilator-associated pneumonia) (HCC)   Right MCA CVA due to R ICA collusion s/p mechanical thrombectomy  -Felt to be embolic in setting of afib w/ Pradaxa noncompliance - Patient for SNF when cleared by pulmonology. Neurology has artery cleared for SNF  -[facility]  Acute Respiratory Failure secondary to CVA -Trach 11/16  - Mucomyst nebulizer QID + tracheal suction QID - on TC at time of exam today, thick purulent foul-smelling sputum continues however thined by Mucomyst  VAP/HCAP/Pulmonary Edema -Antibiotic therapy initiated by PCCM 11/17: Completed course of antibiotics -Tracheal aspirate on 11/17, 11/19, 11/28 normal  respiratory flora   Chronic Afib -Noncompliant w/ Pradaxa - rate controlled at this time  -Eliquis restarted per pharmacy  HTN -Controlled  HLD -LDL 53  -Per neurology niacin not resumed  Hypokalemia -Potassium goal > 4   Hypomagnesemia -Magnesium goal> 2  Dysphagia  -PEG 11/16  - continue tube feeds  Thrombocytopenia -unknown baseline, on chronic anticoagulation  -Resolved  Normocytic Anemia -No gross evidence of blood loss   Recent Labs Lab 11/10/16 0618 11/11/16 0522 11/12/16 0318 11/13/16 0920 11/14/16 0307  HGB 9.9* 9.3* 9.3* 9.8* 9.6*  -stable  Bleeding around Tracheostomy -Resolved  Prediabetes  -A1c 6.2 - consistent with prediabetic state -Sensitive SSI  Prolonged QT interval -11/26 QT interval 482 -Repeat EKG on 11/30   Goals of care -Consult Palliative Care: Setup family meeting on Saturday or Sunday with Mandarin speaking translator, discuss CODE STATUS, short-term vs long-term goals of care. Recovery will be limited. LTAC? -Palliative care has arranged family meeting with translator for Sunday at 1100     DVT prophylaxis: Eliquis  Code Status: Full Family Communication: Wife present Disposition Plan: Vent SNF   Consultants:  PCCM Neurology    Procedures/Significant Events:  11/10 admit w/ acute CVA - to IR for recanalization of R ICA - intubated 11/13 Echocardiogram:Left ventricle: focal   basal hypertrophy of the septum. -LVEF =-55% to 60%. - Tricuspid valve: moderate regurgitation. - Pulmonary arteries:  PA peak pressure: 32 mm Hg (S). 11/16 tracheostomy and PEG placed  11/20 TRH assumed cre  11/24 - Oozing around tracheostomy on 11/24 on systemic anticoagulation 11/24 transfuse 1u PRBC  VENTILATOR SETTINGS:    Cultures 11/10 MRSA by PCR negative 11/10 blood 2 negative 11/16 urine negative 11/16 tracheal aspirate normal flora 11/19 tracheal aspirate normal flora 11/28 tracheal aspirate normal  flora   Antimicrobials: Ceftriaxone 11/17 >>11/22 Ciprofloxacin eyedrops 11/21>> Vancomycin 11/26>> 11/30   Devices    LINES / TUBES:  ET tube 11/10 - 11/16 Left Radial Aline 11/10 - out R Femoral Sheath 11/10 - 11/11 Trach 11/16>> PEG 11/16>>    Continuous Infusions: . sodium chloride 10 mL/hr at 11/13/16 1900  . feeding supplement (JEVITY 1.2 CAL) Stopped (11/14/16 0530)     Objective: Vitals:   11/14/16 0600 11/14/16 0801 11/14/16 0803 11/14/16 0811  BP: 139/75  (!) 155/89 (!) 150/75  Pulse: 83  88 90  Resp: 19  (!) 23 15  Temp:    98.7 F (37.1 C)  TempSrc:    Oral  SpO2: 96% 100% 100% 99%  Weight:      Height:        Intake/Output Summary (Last 24 hours) at 11/14/16 0839 Last data filed at 11/14/16 0600  Gross per 24 hour  Intake           1402.5 ml  Output              650 ml  Net            752.5 ml   Filed Weights   11/11/16 0313 11/12/16 0500 11/14/16 0331  Weight: 83.9 kg (185 lb) 85.3 kg (188 lb) 85.7 kg (189 lb)    Examination:  General:  A/O 0, Eyes open,,Withdraws to painful stimuli, Follows commands, No acute respiratory distress Eyes: negative scleral hemorrhage, positive anisocoria, negative icterus ENT: Negative Runny nose, negative gingival bleeding, Neck:  Negative scars, masses, torticollis, lymphadenopathy, JVD Lungs: diffuse rhonchi, thick yellow-green purulent (foul-smelling) sputum from trach tube  Cardiovascular: Irregular irregular rhythm and rate, without murmur gallop or rub normal S1 and S2 Abdomen: negative abdominal pain, nondistended, positive soft, bowel sounds, no rebound, no ascites, no appreciable mass Extremities: No significant cyanosis, clubbing, or edema bilateral lower extremities Skin: Negative rashes, lesions, ulcers Psychiatric:  Unable to evaluate  Central nervous system:  Unable to evaluate other than patient withdraws to painful stimuli, positive anisocoria, moves R UE to command, and bilateral lower  extremities to command. Per nursing has also mouthed answers to simple questions.  .     Data Reviewed: Care during the described time interval was provided by me .  I have reviewed this patient's available data, including medical history, events of note, physical examination, and all test results as part of my evaluation. I have personally reviewed and interpreted all radiology studies.  CBC:  Recent Labs Lab 11/10/16 0618 11/11/16 0522 11/12/16 0318 11/13/16 0920 11/14/16 0307  WBC 7.5 7.8 7.2 6.6 6.2  NEUTROABS 5.5 5.4 5.1 4.7 4.2  HGB 9.9* 9.3* 9.3* 9.8* 9.6*  HCT 31.3* 29.0* 29.9* 30.8* 30.3*  MCV 89.2 89.2 89.0 89.3 87.3  PLT 367 385 384 334 368   Basic Metabolic Panel:  Recent Labs Lab 11/10/16 0424 11/10/16 0618 11/11/16 0522 11/12/16 0318 11/13/16 0709 11/14/16 0307  NA 144 139 139 140 138 139  K 2.6* 3.9 3.9 4.2 4.6 4.0  CL 120* 106 107 106 103 106  CO2 20* 25 25 28 24 26   GLUCOSE 97 177* 179* 133* 141* 113*  BUN 12 17 15 13 13 14   CREATININE 0.31* 0.44* 0.56* 0.49* 0.58* 0.57*  CALCIUM 5.6* 8.1*  7.8* 8.0* 8.0* 8.1*  MG 1.6*  --  2.4 2.2 2.2 2.3  PHOS 2.7 3.5 3.4 3.5 3.9 3.7   GFR: Estimated Creatinine Clearance: 66.7 mL/min (by C-G formula based on SCr of 0.57 mg/dL (L)). Liver Function Tests:  Recent Labs Lab 11/10/16 0618 11/11/16 0522 11/12/16 0318 11/13/16 0709 11/14/16 0307  ALBUMIN 2.1* 2.0* 2.1* 2.2* 2.1*   No results for input(s): LIPASE, AMYLASE in the last 168 hours. No results for input(s): AMMONIA in the last 168 hours. Coagulation Profile: No results for input(s): INR, PROTIME in the last 168 hours. Cardiac Enzymes: No results for input(s): CKTOTAL, CKMB, CKMBINDEX, TROPONINI in the last 168 hours. BNP (last 3 results) No results for input(s): PROBNP in the last 8760 hours. HbA1C: No results for input(s): HGBA1C in the last 72 hours. CBG:  Recent Labs Lab 11/13/16 1616 11/13/16 2030 11/14/16 0104 11/14/16 0357  11/14/16 0810  GLUCAP 109* 217* 135* 152* 110*   Lipid Profile: No results for input(s): CHOL, HDL, LDLCALC, TRIG, CHOLHDL, LDLDIRECT in the last 72 hours. Thyroid Function Tests: No results for input(s): TSH, T4TOTAL, FREET4, T3FREE, THYROIDAB in the last 72 hours. Anemia Panel: No results for input(s): VITAMINB12, FOLATE, FERRITIN, TIBC, IRON, RETICCTPCT in the last 72 hours. Urine analysis:    Component Value Date/Time   COLORURINE AMBER (A) 10/29/2016 1330   APPEARANCEUR TURBID (A) 10/29/2016 1330   LABSPEC 1.024 10/29/2016 1330   PHURINE 8.5 (H) 10/29/2016 1330   GLUCOSEU NEGATIVE 10/29/2016 1330   HGBUR NEGATIVE 10/29/2016 1330   BILIRUBINUR MODERATE (A) 10/29/2016 1330   KETONESUR NEGATIVE 10/29/2016 1330   PROTEINUR 100 (A) 10/29/2016 1330   NITRITE POSITIVE (A) 10/29/2016 1330   LEUKOCYTESUR SMALL (A) 10/29/2016 1330   Sepsis Labs: @LABRCNTIP (procalcitonin:4,lacticidven:4)  ) Recent Results (from the past 240 hour(s))  Culture, respiratory (NON-Expectorated)     Status: None   Collection Time: 11/10/16 10:53 PM  Result Value Ref Range Status   Specimen Description TRACHEAL ASPIRATE  Final   Special Requests NONE  Final   Gram Stain   Final    FEW WBC PRESENT,BOTH PMN AND MONONUCLEAR MODERATE GRAM NEGATIVE RODS FEW GRAM POSITIVE RODS FEW GRAM POSITIVE COCCI IN PAIRS    Culture Consistent with normal respiratory flora.  Final   Report Status 11/12/2016 FINAL  Final         Radiology Studies: No results found.      Scheduled Meds: . acetylcysteine  4 mL Nebulization QID  . apixaban  5 mg Oral BID  . bacitracin-polymyxin b   Right Eye QHS  . chlorhexidine  15 mL Mouth Rinse BID  . ciprofloxacin  2 drop Right Eye TID AC  . feeding supplement (PRO-STAT SUGAR FREE 64)  30 mL Per Tube TID  . free water  100 mL Per Tube Q8H  . hydrALAZINE  50 mg Per Tube Q8H  . insulin aspart  0-15 Units Subcutaneous Q4H  . magnesium sulfate 1 - 4 g bolus IVPB  4 g  Intravenous Once  . mouth rinse  15 mL Mouth Rinse 10 times per day  . pantoprazole sodium  40 mg Per Tube Daily  . potassium chloride  40 mEq Per Tube Once  . potassium chloride (KCL MULTIRUN) 30 mEq in 265 mL IVPB  30 mEq Intravenous Once  . sennosides  5 mL Per Tube BID   Continuous Infusions: . sodium chloride 10 mL/hr at 11/13/16 1900  . feeding supplement (JEVITY 1.2 CAL) Stopped (11/14/16 0530)  LOS: 22 days    Time spent: 40 minutes    Braleigh Massoud, Sean Messier, MD [facility] Pager [phone]   If 7PM-7AM, please contact night-coverage [email or web address] Password [facility] 11/14/2016, 8:39 AM

## 2016-11-15 DIAGNOSIS — R9431 Abnormal electrocardiogram [ECG] [EKG]: Secondary | ICD-10-CM | POA: Diagnosis present

## 2016-11-15 DIAGNOSIS — I482 Chronic atrial fibrillation, unspecified: Secondary | ICD-10-CM

## 2016-11-15 DIAGNOSIS — Z7189 Other specified counseling: Secondary | ICD-10-CM

## 2016-11-15 DIAGNOSIS — J810 Acute pulmonary edema: Secondary | ICD-10-CM | POA: Diagnosis present

## 2016-11-15 DIAGNOSIS — Z515 Encounter for palliative care: Secondary | ICD-10-CM

## 2016-11-15 DIAGNOSIS — R1311 Dysphagia, oral phase: Secondary | ICD-10-CM

## 2016-11-15 DIAGNOSIS — I63349 Cerebral infarction due to thrombosis of unspecified cerebellar artery: Secondary | ICD-10-CM

## 2016-11-15 DIAGNOSIS — E7849 Other hyperlipidemia: Secondary | ICD-10-CM

## 2016-11-15 LAB — CBC WITH DIFFERENTIAL/PLATELET
Basophils Absolute: 0 10*3/uL (ref 0.0–0.1)
Basophils Relative: 1 %
EOS ABS: 0.3 10*3/uL (ref 0.0–0.7)
Eosinophils Relative: 5 %
HCT: 29.4 % — ABNORMAL LOW (ref 39.0–52.0)
HEMOGLOBIN: 9.2 g/dL — AB (ref 13.0–17.0)
LYMPHS ABS: 1.2 10*3/uL (ref 0.7–4.0)
LYMPHS PCT: 24 %
MCH: 27.6 pg (ref 26.0–34.0)
MCHC: 31.3 g/dL (ref 30.0–36.0)
MCV: 88.3 fL (ref 78.0–100.0)
MONOS PCT: 7 %
Monocytes Absolute: 0.4 10*3/uL (ref 0.1–1.0)
NEUTROS PCT: 63 %
Neutro Abs: 3.3 10*3/uL (ref 1.7–7.7)
Platelets: 340 10*3/uL (ref 150–400)
RBC: 3.33 MIL/uL — AB (ref 4.22–5.81)
RDW: 14.7 % (ref 11.5–15.5)
WBC: 5.2 10*3/uL (ref 4.0–10.5)

## 2016-11-15 LAB — RENAL FUNCTION PANEL
ANION GAP: 9 (ref 5–15)
Albumin: 2.1 g/dL — ABNORMAL LOW (ref 3.5–5.0)
BUN: 15 mg/dL (ref 6–20)
CALCIUM: 7.9 mg/dL — AB (ref 8.9–10.3)
CHLORIDE: 103 mmol/L (ref 101–111)
CO2: 25 mmol/L (ref 22–32)
Creatinine, Ser: 0.67 mg/dL (ref 0.61–1.24)
GFR calc non Af Amer: >60 mL/min (ref >60)
GLUCOSE: 142 mg/dL — AB (ref 65–99)
POTASSIUM: 3.9 mmol/L (ref 3.5–5.1)
Phosphorus: 3.7 mg/dL (ref 2.5–4.6)
SODIUM: 137 mmol/L (ref 135–145)

## 2016-11-15 LAB — GLUCOSE, CAPILLARY
GLUCOSE-CAPILLARY: 132 mg/dL — AB (ref 65–99)
GLUCOSE-CAPILLARY: 155 mg/dL — AB (ref 65–99)
GLUCOSE-CAPILLARY: 160 mg/dL — AB (ref 65–99)

## 2016-11-15 LAB — MAGNESIUM: Magnesium: 2.2 mg/dL (ref 1.7–2.4)

## 2016-11-15 NOTE — Progress Notes (Signed)
PROGRESS NOTE    Sean [name]  WJX:[id number] DOB: August 19, 1926 DOA: 10/23/2016 PCP: No primary care provider on file.   Brief Narrative:   82 y.o.  MANDARIN SPEAKING AM PMHx HTN, Atrial Fibrillation on Pradaxa (noncompliance with medication),  Presenting with left hemiplegia and speech difficulties. He did not receive IV t-PA due to anticoagulation. Had mechanical thrombectomy   Subjective: 12/3   A/O 0, withdraws to painful stimuli. Eyes open, follows commands afebrile last 24 hours    Assessment & Plan:   Active Problems:   Cerebral embolism with cerebral infarction   Cerebrovascular accident (CVA) due to thrombosis of precerebral artery (HCC)   Acute respiratory failure with hypoxia (HCC)   Cerebrovascular accident (CVA) due to embolism of right carotid artery ([place])   Encounter for intubation   Chronic a-fib (HCC)   Dysphagia   Acute blood loss anemia   Thrombocytopenia (HCC)   Benign essential HTN   Bradycardia   Pain   Ventilator dependence ([place])   HCAP (healthcare-associated pneumonia)   Cerebral infarction due to embolism of right carotid artery ([place])   Essential hypertension   Tracheostomy care ([place])   Pressure injury of skin   Bleeding   Cerebrovascular accident (CVA) ([place])   VAP (ventilator-associated pneumonia) ([place])   Cerebrovascular accident (CVA) due to thrombosis of cerebellar artery ([place])   Acute pulmonary edema (HCC)   Chronic atrial fibrillation ([place])   Other hyperlipidemia   Oral phase dysphagia   Prolonged QT interval   Right MCA CVA due to R ICA collusion s/p mechanical thrombectomy  -Felt to be embolic in setting of afib w/ Pradaxa noncompliance - Patient for SNF when cleared by pulmonology. Neurology has artery cleared for SNF  -Scheduled follow up with Dr. Rosalin Hawking at [facility] in about 6 weeks  Acute Respiratory Failure secondary to CVA -Trach 11/16  - Mucomyst nebulizer QID + tracheal suction QID - on TC at time of exam today, thick  purulent foul-smelling sputum continues however thined by Mucomyst  VAP/HCAP/Pulmonary Edema -Antibiotic therapy initiated by PCCM 11/17: Completed course of antibiotics -Tracheal aspirate on 11/17, 11/19, 11/28 normal respiratory flora   Chronic Afib -Noncompliant w/ Pradaxa - rate controlled at this time  -Eliquis restarted per pharmacy  HTN -Controlled  HLD -LDL 53  -Per neurology niacin not resumed  Hypokalemia -Potassium goal > 4   Hypomagnesemia -Magnesium goal> 2  Dysphagia  -PEG 11/16  - continue tube feeds  Thrombocytopenia -unknown baseline, on chronic anticoagulation  -Resolved  Normocytic Anemia -No gross evidence of blood loss   Recent Labs Lab 11/11/16 0522 11/12/16 0318 11/13/16 0920 11/14/16 0307 11/15/16 0145  HGB 9.3* 9.3* 9.8* 9.6* 9.2*  -stable  Bleeding around Tracheostomy -Resolved  Prediabetes  -A1c 6.2 - consistent with prediabetic state -Sensitive SSI  Prolonged QT interval -11/26 QT interval 482 -Repeat EKG on 11/30   Goals of care -Consult Palliative Care: Setup family meeting on Saturday or Sunday with Mandarin speaking translator, discuss CODE STATUS, short-term vs long-term goals of care. Recovery will be limited. LTAC? -Palliative care and I met with family with translator Who spoke Mandarin today. Explained that LTAC or palliative care at home would be only courses of further treatment at this time. Patient would not qualify for SNF secondary to extremely poor performance and not being able to participate. Family has chosen LTAC will contact NCM Debbie in the A.m. to make arrangements. Son provided with number     DVT prophylaxis: Eliquis  Code Status: Full  Family Communication: Wife present Disposition Plan: Vent SNF   Consultants:  PCCM Neurology    Procedures/Significant Events:  11/10 admit w/ acute CVA - to IR for recanalization of R ICA - intubated 11/13 Echocardiogram:Left ventricle: focal    basal hypertrophy of the septum. -LVEF =-55% to 60%. - Tricuspid valve: moderate regurgitation. - Pulmonary arteries:  PA peak pressure: 32 mm Hg (S). 11/16 tracheostomy and PEG placed  11/20 TRH assumed cre  11/24 - Oozing around tracheostomy on 11/24 on systemic anticoagulation 11/24 transfuse 1u PRBC   VENTILATOR SETTINGS:    Cultures 11/10 MRSA by PCR negative 11/10 blood 2 negative 11/16 urine negative 11/16 tracheal aspirate normal flora 11/19 tracheal aspirate normal flora 11/28 tracheal aspirate normal flora   Antimicrobials: Ceftriaxone 11/17 >>11/22 Ciprofloxacin eyedrops 11/21>> Vancomycin 11/26>> 11/30   Devices    LINES / TUBES:  ET tube 11/10 - 11/16 Left Radial Aline 11/10 - out R Femoral Sheath 11/10 - 11/11 Trach 11/16>> PEG 11/16>>    Continuous Infusions: . sodium chloride 10 mL/hr at 11/14/16 2200  . feeding supplement (JEVITY 1.2 CAL) 1,000 mL (11/15/16 0412)     Objective: Vitals:   11/15/16 0600 11/15/16 0700 11/15/16 0753 11/15/16 0803  BP: 129/70 (!) 147/78 (!) 144/72 130/85  Pulse: 77 68 82 85  Resp: (!) _0 Temp:   98.5 F (36.9 C)   TempSrc:   Axillary   SpO2: 98% 97% 98% 99%  Weight:      Height:        Intake/Output Summary (Last 24 hours) at 11/15/16 0836 Last data filed at 11/15/16 3734  Gross per 24 hour  Intake          1458.67 ml  Output              550 ml  Net           908.67 ml   Filed Weights   11/12/16 0500 11/14/16 0331 11/15/16 0404  Weight: 85.3 kg (188 lb) 85.7 kg (189 lb) 86.6 kg (191 lb)    Examination:  General:  A/O 0, Eyes open,,Withdraws to painful stimuli, Follows commands, No acute respiratory distress Eyes: negative scleral hemorrhage, positive anisocoria, negative icterus ENT: Negative Runny nose, negative gingival bleeding, Neck:  Negative scars, masses, torticollis, lymphadenopathy, JVD Lungs: diffuse rhonchi, thick yellow-green purulent (foul-smelling) sputum from trach  tube  Cardiovascular: Irregular irregular rhythm and rate, without murmur gallop or rub normal S1 and S2 Abdomen: negative abdominal pain, nondistended, positive soft, bowel sounds, no rebound, no ascites, no appreciable mass Extremities: No significant cyanosis, clubbing, or edema bilateral lower extremities Skin: Negative rashes, lesions, ulcers Psychiatric:  Unable to evaluate  Central nervous system:  Unable to evaluate other than patient withdraws to painful stimuli, positive anisocoria, moves R UE to command, and bilateral lower extremities to command. Per nursing has also mouthed answers to simple questions.  .     Data Reviewed: Care during the described time interval was provided by me .  I have reviewed this patient's available data, including medical history, events of note, physical examination, and all test results as part of my evaluation. I have personally reviewed and interpreted all radiology studies.  CBC:  Recent Labs Lab 11/11/16 0522 11/12/16 0318 11/13/16 0920 11/14/16 0307 11/15/16 0145  WBC 7.8 7.2 6.6 6.2 5.2  NEUTROABS 5.4 5.1 4.7 4.2 3.3  HGB 9.3* 9.3* 9.8* 9.6* 9.2*  HCT 29.0* 29.9* 30.8* 30.3* 29.4*  MCV 89.2  89.0 89.3 87.3 88.3  PLT 385 384 334 368 417   Basic Metabolic Panel:  Recent Labs Lab 11/11/16 0522 11/12/16 0318 11/13/16 0709 11/14/16 0307 11/15/16 0145  NA 139 140 138 139 137  K 3.9 4.2 4.6 4.0 3.9  CL 107 106 103 106 103  CO2 _0 GLUCOSE 179* 133* 141* 113* 142*  BUN _1 CREATININE 0.56* 0.49* 0.58* 0.57* 0.67  CALCIUM 7.8* 8.0* 8.0* 8.1* 7.9*  MG 2.4 2.2 2.2 2.3 2.2  PHOS 3.4 3.5 3.9 3.7 3.7   GFR: Estimated Creatinine Clearance: 67.1 mL/min (by C-G formula based on SCr of 0.67 mg/dL). Liver Function Tests:  Recent Labs Lab 11/11/16 0522 11/12/16 0318 11/13/16 0709 11/14/16 0307 11/15/16 0145  ALBUMIN 2.0* 2.1* 2.2* 2.1* 2.1*   No results for input(s): LIPASE, AMYLASE in the last 168  hours. No results for input(s): AMMONIA in the last 168 hours. Coagulation Profile: No results for input(s): INR, PROTIME in the last 168 hours. Cardiac Enzymes: No results for input(s): CKTOTAL, CKMB, CKMBINDEX, TROPONINI in the last 168 hours. BNP (last 3 results) No results for input(s): PROBNP in the last 8760 hours. HbA1C: No results for input(s): HGBA1C in the last 72 hours. CBG:  Recent Labs Lab 11/14/16 1139 11/14/16 1631 11/14/16 2010 11/14/16 2304 11/15/16 0406  GLUCAP 177* 150* 120* 170* 132*   Lipid Profile: No results for input(s): CHOL, HDL, LDLCALC, TRIG, CHOLHDL, LDLDIRECT in the last 72 hours. Thyroid Function Tests: No results for input(s): TSH, T4TOTAL, FREET4, T3FREE, THYROIDAB in the last 72 hours. Anemia Panel: No results for input(s): VITAMINB12, FOLATE, FERRITIN, TIBC, IRON, RETICCTPCT in the last 72 hours. Urine analysis:    Component Value Date/Time   COLORURINE AMBER (A) 10/29/2016 1330   APPEARANCEUR TURBID (A) 10/29/2016 1330   LABSPEC 1.024 10/29/2016 1330   PHURINE 8.5 (H) 10/29/2016 1330   GLUCOSEU NEGATIVE 10/29/2016 1330   HGBUR NEGATIVE 10/29/2016 1330   BILIRUBINUR MODERATE (A) 10/29/2016 1330   KETONESUR NEGATIVE 10/29/2016 1330   PROTEINUR 100 (A) 10/29/2016 1330   NITRITE POSITIVE (A) 10/29/2016 1330   LEUKOCYTESUR SMALL (A) 10/29/2016 1330   Sepsis Labs: _2 (procalcitonin:4,lacticidven:4)  ) Recent Results (from the past 240 hour(s))  Culture, respiratory (NON-Expectorated)     Status: None   Collection Time: 11/10/16 10:53 PM  Result Value Ref Range Status   Specimen Description TRACHEAL ASPIRATE  Final   Special Requests NONE  Final   Gram Stain   Final    FEW WBC PRESENT,BOTH PMN AND MONONUCLEAR MODERATE GRAM NEGATIVE RODS FEW GRAM POSITIVE RODS FEW GRAM POSITIVE COCCI IN PAIRS    Culture Consistent with normal respiratory flora.  Final   Report Status 11/12/2016 FINAL  Final         Radiology  Studies: No results found.      Scheduled Meds: . apixaban  5 mg Oral BID  . bacitracin-polymyxin b   Right Eye QHS  . chlorhexidine  15 mL Mouth Rinse BID  . ciprofloxacin  2 drop Right Eye TID AC  . feeding supplement (PRO-STAT SUGAR FREE 64)  30 mL Per Tube TID  . free water  100 mL Per Tube Q8H  . hydrALAZINE  50 mg Per Tube Q8H  . insulin aspart  0-15 Units Subcutaneous Q4H  . magnesium sulfate 1 - 4 g bolus IVPB  4 g Intravenous Once  . mouth rinse  15 mL Mouth Rinse 10 times per  day  . pantoprazole sodium  40 mg Per Tube Daily  . potassium chloride  40 mEq Per Tube Once  . potassium chloride (KCL MULTIRUN) 30 mEq in 265 mL IVPB  30 mEq Intravenous Once  . sennosides  5 mL Per Tube BID   Continuous Infusions: . sodium chloride 10 mL/hr at 11/14/16 2200  . feeding supplement (JEVITY 1.2 CAL) 1,000 mL (11/15/16 0412)     LOS: 23 days    Time spent: 40 minutes    Tayt Moyers, Geraldo Docker, MD [facility] Pager [phone]   If 7PM-7AM, please contact night-coverage [email or web address] Password [facility] 11/15/2016, 8:36 AM

## 2016-11-15 NOTE — Consult Note (Signed)
Consultation Note Date: 11/15/2016   Patient Name: Sean [name]  DOB: 1926-05-02  MRN: [id number]  Age / Sex: 82 y.o., male  PCP: No primary care provider on file. Referring Physician: Allie Bossier, MD  Reason for Consultation: Establishing goals of care  HPI/Patient Profile: 82 y.o. male  with past medical history of hypertension, Afib admitted on 10/23/2016 with altered mental status and not moving his left side. Workup revealed R MCA CVA with thrombosis. He had mechanical thrombectomy. He has residual deficits including dysphagia, aphasia, respiratory failure (now with trach collar). He has developed HCAP. Palliative medicine consulted for [place].    Clinical Assessment and Goals of Care: Met with patient's son, Marcello Moores, with mandarin translator present. Patient's wife was at bedside but she did not want to attend meeting. Discussed patient's trajectory of illness, poor prognosis of recovery, and likely long term nursing facility placement. Discussed code status, including defining DNR vs. Full code status. Son notes prior to admission patient was not ambulating very well and could barely walk around his home. Discussed that his poor functional status prior to CVA is indicative that his post CVA recovery will be minimal. [name]'s current GOC would be to stabilize patient for him to return home. Discussed options of continued aggressive therapies vs. Comfort measures and hospice care with goals of comfort vs. Lengthening lifetime. Marcello Moores wishes to discuss this information with his Mom and his siblings (live in [place]). States he will contact palliative medicine once he has talked to them and more decisions are made.   Primary Decision Maker NEXT OF KIN patient's spouse and siblings are working together    SUMMARY OF RECOMMENDATIONS -Continue full scope of care for now -Palliative medicine will follow up after  son talks to rest of family    Code Status/Advance Care Planning:  Full code    Symptom Management:   No recommendations at this time   Additional Recommendations (Limitations, Scope, Preferences):  Full Scope Treatment  Psycho-social/Spiritual:   Desire for further Chaplaincy support:No  Additional Recommendations: Education on Hospice  Prognosis:    Unable to determine  Discharge Planning: To Be Determined  Primary Diagnoses: Present on Admission: . Cerebrovascular accident (CVA) due to thrombosis of precerebral artery ([place])   I have reviewed the medical record, interviewed the patient and family, and examined the patient. The following aspects are pertinent.  Past Medical History:  Diagnosis Date  . Atrial fibrillation ([place])   . Difficult intubation    "doctors say he has a little throat" (11/03/2016)  . History of kidney stones    "removed it; long time ago; don't know how" (11/03/2016)  . Hyperlipidemia   . Hypertension    Social History   Social History  . Marital status: Married    Spouse name: N/A  . Number of children: N/A  . Years of education: N/A   Social History Main Topics  . Smoking status: Former Smoker    Quit date: 10/24/2001  . Smokeless tobacco: Never Used  . Alcohol use None  .  Drug use: Unknown  . Sexual activity: Not Asked   Other Topics Concern  . None   Social History Narrative  . None   History reviewed. No pertinent family history. Scheduled Meds: . apixaban  5 mg Oral BID  . bacitracin-polymyxin b   Right Eye QHS  . chlorhexidine  15 mL Mouth Rinse BID  . ciprofloxacin  2 drop Right Eye TID AC  . feeding supplement (PRO-STAT SUGAR FREE 64)  30 mL Per Tube TID  . free water  100 mL Per Tube Q8H  . hydrALAZINE  50 mg Per Tube Q8H  . insulin aspart  0-15 Units Subcutaneous Q4H  . magnesium sulfate 1 - 4 g bolus IVPB  4 g Intravenous Once  . mouth rinse  15 mL Mouth Rinse 10 times per day  . pantoprazole sodium   40 mg Per Tube Daily  . potassium chloride  40 mEq Per Tube Once  . potassium chloride (KCL MULTIRUN) 30 mEq in 265 mL IVPB  30 mEq Intravenous Once  . sennosides  5 mL Per Tube BID   Continuous Infusions: . sodium chloride 10 mL/hr at 11/15/16 0800  . feeding supplement (JEVITY 1.2 CAL) 1,000 mL (11/15/16 0800)   PRN Meds:.acetaminophen (TYLENOL) oral liquid 160 mg/5 mL, albuterol, bisacodyl, fentaNYL (SUBLIMAZE) injection, haloperidol lactate, labetalol, sodium chloride flush Medications Prior to Admission:  Prior to Admission medications   Medication Sig Start Date End Date Taking? Authorizing Provider  ibuprofen (ADVIL,MOTRIN) 200 MG tablet Take 400 mg by mouth every 6 (six) hours as needed for headache (pain).   Yes Historical Provider, MD  lisinopril-hydrochlorothiazide (PRINZIDE,ZESTORETIC) 20-25 MG tablet Take 1 tablet by mouth daily.   Yes Historical Provider, MD  niacin 500 MG tablet Take 500 mg by mouth daily.   Yes Historical Provider, MD  dabigatran (PRADAXA) 150 MG CAPS capsule Take 150 mg by mouth 2 (two) times daily.    Historical Provider, MD   No Known Allergies Review of Systems  Unable to perform ROS: Patient nonverbal    Physical Exam  Constitutional: He appears well-developed and well-nourished. No distress.  Elderly male  Cardiovascular: Normal rate and regular rhythm.   Pulmonary/Chest: Effort normal. He has rales.  Thick secretions noted  Abdominal: Soft. Bowel sounds are normal.  Musculoskeletal:  Did not follow commands  Neurological:  not responsive to voice  Skin: Skin is warm and dry.  Nursing note and vitals reviewed.   Vital Signs: BP (!) 121/47   Pulse 74   Temp 98.5 F (36.9 C) (Axillary)   Resp 16   Ht _0  (1.702 m)   Wt 86.6 kg (191 lb)   SpO2 97%   BMI 29.91 kg/m  Pain Assessment: CPOT POSS *See Group Information*: 1-Acceptable,Awake and alert Pain Score: 0-No pain   SpO2: SpO2: 97 % O2 Device:SpO2: 97 % O2 Flow Rate: .O2  Flow Rate (L/min): 6 L/min  IO: Intake/output summary:  Intake/Output Summary (Last 24 hours) at 11/15/16 1134 Last data filed at 11/15/16 1000  Gross per 24 hour  Intake             1745 ml  Output              550 ml  Net             1195 ml    LBM: Last BM Date: 11/15/16 Baseline Weight: Weight: 84.4 kg (186 lb) Most recent weight: Weight: 86.6 kg (191 lb)  Palliative Assessment/Data: PPS: 10%   Flowsheet Rows   Flowsheet Row Most Recent Value  Intake Tab  Referral Department  Hospitalist  Unit at Time of Referral  ICU  Palliative Care Primary Diagnosis  Neurology  Date Notified  11/12/16  Palliative Care Type  New Palliative care  Reason for referral  Clarify Goals of Care  Date of Admission  10/23/16  # of days IP prior to Palliative referral  20  Clinical Assessment  Psychosocial & Spiritual Assessment  Palliative Care Outcomes      Thank you for this consult. Palliative medicine will continue to follow and assist as needed.   Time In: 1100 Time Out: 1145 Time Total: 45 mins Greater than 50%  of this time was spent counseling and coordinating care related to the above assessment and plan.  Signed by: Mariana Kaufman, AGNP-C Palliative Medicine    Please contact Palliative Medicine Team phone at [phone] for questions and concerns.  For individual provider: See Shea Evans

## 2016-11-16 LAB — RENAL FUNCTION PANEL
ANION GAP: 7 (ref 5–15)
Albumin: 2.3 g/dL — ABNORMAL LOW (ref 3.5–5.0)
BUN: 13 mg/dL (ref 6–20)
CHLORIDE: 103 mmol/L (ref 101–111)
CO2: 24 mmol/L (ref 22–32)
Calcium: 7.6 mg/dL — ABNORMAL LOW (ref 8.9–10.3)
Creatinine, Ser: 0.6 mg/dL — ABNORMAL LOW (ref 0.61–1.24)
GFR calc non Af Amer: >60 mL/min (ref >60)
Glucose, Bld: 131 mg/dL — ABNORMAL HIGH (ref 65–99)
Phosphorus: 3.4 mg/dL (ref 2.5–4.6)
Potassium: 4.1 mmol/L (ref 3.5–5.1)
Sodium: 134 mmol/L — ABNORMAL LOW (ref 135–145)

## 2016-11-16 LAB — CBC WITH DIFFERENTIAL/PLATELET
BASOS PCT: 1 %
Basophils Absolute: 0 10*3/uL (ref 0.0–0.1)
Eosinophils Absolute: 0.2 10*3/uL (ref 0.0–0.7)
Eosinophils Relative: 4 %
HEMATOCRIT: 29 % — AB (ref 39.0–52.0)
HEMOGLOBIN: 9.5 g/dL — AB (ref 13.0–17.0)
LYMPHS ABS: 1.4 10*3/uL (ref 0.7–4.0)
LYMPHS PCT: 22 %
MCH: 28.8 pg (ref 26.0–34.0)
MCHC: 32.8 g/dL (ref 30.0–36.0)
MCV: 87.9 fL (ref 78.0–100.0)
MONO ABS: 0.5 10*3/uL (ref 0.1–1.0)
MONOS PCT: 8 %
NEUTROS ABS: 4.2 10*3/uL (ref 1.7–7.7)
NEUTROS PCT: 65 %
Platelets: 257 10*3/uL (ref 150–400)
RBC: 3.3 MIL/uL — ABNORMAL LOW (ref 4.22–5.81)
RDW: 14.8 % (ref 11.5–15.5)
WBC: 6.3 10*3/uL (ref 4.0–10.5)

## 2016-11-16 LAB — GLUCOSE, CAPILLARY
GLUCOSE-CAPILLARY: 145 mg/dL — AB (ref 65–99)
GLUCOSE-CAPILLARY: 174 mg/dL — AB (ref 65–99)
GLUCOSE-CAPILLARY: 151 mg/dL — AB (ref 65–99)
GLUCOSE-CAPILLARY: 164 mg/dL — AB (ref 65–99)
Glucose-Capillary: 153 mg/dL — ABNORMAL HIGH (ref 65–99)

## 2016-11-16 LAB — MAGNESIUM: Magnesium: 2.1 mg/dL (ref 1.7–2.4)

## 2016-11-16 NOTE — Progress Notes (Signed)
Report given to Danne HarborAubrey RN at this time.  Pt to be moved after change of shift.

## 2016-11-16 NOTE — Progress Notes (Addendum)
Saw dr Joseph [place]woods note about ltac eval. Do not think his ins will cover ltac since off vent. Have asked both ltacs to do eliidg check but probably trach snf only option.spoke w son Maisie Fusthomas. I explained I cannot promise ltac that is dependant on ins coverage. Explained 2 ltac's in [place]. [facility] would [facility] and if [facility] cannot get approval then try [facility] and then if no approval will discuss snf for rehab. Await [facility] to see if they get auth from ins carrier. Cont to follow.

## 2016-11-16 NOTE — Progress Notes (Addendum)
[place] TEAM 1 - [place]  STM:[id number] DOB: [birth year] DOA: 10/23/2016 PCP: No primary care provider on file.    Brief Narrative:  82 y/o M admitted 11/10 with c/o AMS, no movement on left side, unable to speak.  Found to have R ICA occlusion / MCA CVA.  To Neuro IR for thrombectomy, returned to ICU on vent.   Significant Events: 11/10 admit w/ acute CVA - to IR for recanalization of R ICA - intubated 11/16 tracheostomy and PEG placed  11/20 TRH assumed care   Subjective: The patient is not able to communicate with me.  There is no family present.  There is no evidence of uncontrolled pain or acute respiratory distress.  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  Felt to be embolic in setting of afib w/ Pradaxa noncompliance - anticoag w/ Eliquis presently - OT/PT suggesting SNF   Acute Respiratory Failure secondary to CVA Trach 11/16 - ongoing tracheostomy care per PCCM - on TC w/ some ongoing greenish trach secretions   Chronic Afib Noncompliant w/ Pradaxa - rate controlled at this time - Eliquis ongoing    HTN Reasonably well controlled  HLD LDL 53 - f/u as outpt  Hypokalemia Corrected  Dysphagia  PEG 11/16 - continue tube feeds - abdom binder in place    Thrombocytopenia - resolved  unknown baseline, on chronic anticoagulation - platelet count has normalized   Normocytic Anemia No gross evidence of blood loss at this time - hemoglobin stable even on anticoag   Recent Labs Lab 11/12/16 0318 11/13/16 0920 11/14/16 0307 11/15/16 0145 11/16/16 0211  HGB 9.3* 9.8* 9.6* 9.2* 9.5*    Bleeding around Tracheostomy resolved  HCAP - ? Effusion Left base  Antibiotic therapy initiated by PCCM 11/17 - CXR w/o signif change 11/22 - has completed an abx course   Hyperglycemia  A1c 6.2 - consistent with prediabetic state- stable for now   Goals of Care Palliative Care has met with the family  - family is discussing options and is to contact Palliative Care when a decision has been made  DVT prophylaxis: eliquis Code Status: FULL CODE Family Communication: no family present at time of exam  Disposition Plan:  SNF pending - needs change to cuffless trach prior to d/c   Consultants:  PCCM Neurology   Antimicrobials:  Ceftriaxone 11/17 > 11/22 Vanc 11/26 > 11/28  Objective: Blood pressure (!) 157/73, pulse 91, temperature 98.5 F (36.9 C), temperature source Oral, resp. rate 20, height 5' 7" (1.702 m), weight 89.8 kg (198 lb), SpO2 100 %.  Intake/Output Summary (Last 24 hours) at 11/16/16 1048 Last data filed at 11/16/16 0900  Gross per 24 hour  Intake             1535 ml  Output             2100 ml  Net             -565 ml   Filed Weights   11/14/16 0331 11/15/16 0404 11/16/16 0454  Weight: 85.7 kg (189 lb) 86.6 kg (191 lb) 89.8 kg (198 lb)    Examination: General: No acute respiratory distress appreciated  Lungs: CTA B - no wheeze  Cardiovascular: irregularly irregular - rate controlled  Abdomen: Nondistended, soft, bowel sounds positive - PEG insertion clean/dry  Extremities: No edema bilateral lower extremities  CBC:  Recent Labs Lab 11/12/16 0318 11/13/16 0920 11/14/16 8921  11/15/16 0145 11/16/16 0211  WBC 7.2 6.6 6.2 5.2 6.3  NEUTROABS 5.1 4.7 4.2 3.3 4.2  HGB 9.3* 9.8* 9.6* 9.2* 9.5*  HCT 29.9* 30.8* 30.3* 29.4* 29.0*  MCV 89.0 89.3 87.3 88.3 87.9  PLT 384 334 368 340 753   Basic Metabolic Panel:  Recent Labs Lab 11/12/16 0318 11/13/16 0709 11/14/16 0307 11/15/16 0145 11/16/16 0211  NA 140 138 139 137 134*  K 4.2 4.6 4.0 3.9 4.1  CL 106 103 106 103 103  CO2 _0 GLUCOSE 133* 141* 113* 142* 131*  BUN _1 CREATININE 0.49* 0.58* 0.57* 0.67 0.60*  CALCIUM 8.0* 8.0* 8.1* 7.9* 7.6*  MG 2.2 2.2 2.3 2.2 2.1  PHOS 3.5 3.9 3.7 3.7 3.4   GFR: Estimated Creatinine Clearance: 68.3 mL/min (by C-G formula based on SCr  of 0.6 mg/dL (L)).  Liver Function Tests:  Recent Labs Lab 11/12/16 0318 11/13/16 0709 11/14/16 0307 11/15/16 0145 11/16/16 0211  ALBUMIN 2.1* 2.2* 2.1* 2.1* 2.3*    HbA1C: Hgb A1c MFr Bld  Date/Time Value Ref Range Status  10/24/2016 05:00 AM 6.2 (H) 4.8 - 5.6 % Final    Comment:    (NOTE)         Pre-diabetes: 5.7 - 6.4         Diabetes: >6.4         Glycemic control for adults with diabetes: <7.0     CBG:  Recent Labs Lab 11/15/16 0406 11/15/16 2030 11/15/16 2351 11/16/16 0405 11/16/16 0801  GLUCAP 132* 160* 155* 145* 174*    Recent Results (from the past 240 hour(s))  Culture, respiratory (NON-Expectorated)     Status: None   Collection Time: 11/10/16 10:53 PM  Result Value Ref Range Status   Specimen Description TRACHEAL ASPIRATE  Final   Special Requests NONE  Final   Gram Stain   Final    FEW WBC PRESENT,BOTH PMN AND MONONUCLEAR MODERATE GRAM NEGATIVE RODS FEW GRAM POSITIVE RODS FEW GRAM POSITIVE COCCI IN PAIRS    Culture Consistent with normal respiratory flora.  Final   Report Status 11/12/2016 FINAL  Final     Scheduled Meds: . apixaban  5 mg Oral BID  . bacitracin-polymyxin b   Right Eye QHS  . chlorhexidine  15 mL Mouth Rinse BID  . ciprofloxacin  2 drop Right Eye TID AC  . feeding supplement (PRO-STAT SUGAR FREE 64)  30 mL Per Tube TID  . free water  100 mL Per Tube Q8H  . hydrALAZINE  50 mg Per Tube Q8H  . insulin aspart  0-15 Units Subcutaneous Q4H  . magnesium sulfate 1 - 4 g bolus IVPB  4 g Intravenous Once  . mouth rinse  15 mL Mouth Rinse 10 times per day  . pantoprazole sodium  40 mg Per Tube Daily  . potassium chloride  40 mEq Per Tube Once  . potassium chloride (KCL MULTIRUN) 30 mEq in 265 mL IVPB  30 mEq Intravenous Once  . sennosides  5 mL Per Tube BID   Continuous Infusions: . sodium chloride 10 mL/hr at 11/16/16 0900  . feeding supplement (JEVITY 1.2 CAL) 1,000 mL (11/16/16 0900)     LOS: 24 days   Cherene Altes, MD [facility] Office  [phone] Pager - Text Page per Amion as per below:  On-Call/Text Page:      [email or web address]      password [email or web address]  If 7PM-7AM, please  contact night-coverage [email or web address] Password [email or web address] 11/16/2016, 10:48 AM

## 2016-11-17 ENCOUNTER — Inpatient Hospital Stay (HOSPITAL_COMMUNITY): Payer: Medicare (Managed Care)

## 2016-11-17 DIAGNOSIS — I63311 Cerebral infarction due to thrombosis of right middle cerebral artery: Secondary | ICD-10-CM

## 2016-11-17 LAB — GLUCOSE, CAPILLARY
GLUCOSE-CAPILLARY: 120 mg/dL — AB (ref 65–99)
GLUCOSE-CAPILLARY: 128 mg/dL — AB (ref 65–99)
GLUCOSE-CAPILLARY: 132 mg/dL — AB (ref 65–99)
GLUCOSE-CAPILLARY: 147 mg/dL — AB (ref 65–99)
GLUCOSE-CAPILLARY: 178 mg/dL — AB (ref 65–99)
Glucose-Capillary: 130 mg/dL — ABNORMAL HIGH (ref 65–99)

## 2016-11-17 LAB — CBC WITH DIFFERENTIAL/PLATELET
BASOS ABS: 0 10*3/uL (ref 0.0–0.1)
BASOS PCT: 0 %
Eosinophils Absolute: 0.2 10*3/uL (ref 0.0–0.7)
Eosinophils Relative: 3 %
HEMATOCRIT: 29.8 % — AB (ref 39.0–52.0)
HEMOGLOBIN: 9.6 g/dL — AB (ref 13.0–17.0)
LYMPHS PCT: 19 %
Lymphs Abs: 1.3 10*3/uL (ref 0.7–4.0)
MCH: 28.2 pg (ref 26.0–34.0)
MCHC: 32.2 g/dL (ref 30.0–36.0)
MCV: 87.4 fL (ref 78.0–100.0)
MONO ABS: 0.5 10*3/uL (ref 0.1–1.0)
MONOS PCT: 7 %
NEUTROS ABS: 4.9 10*3/uL (ref 1.7–7.7)
NEUTROS PCT: 71 %
Platelets: 265 10*3/uL (ref 150–400)
RBC: 3.41 MIL/uL — ABNORMAL LOW (ref 4.22–5.81)
RDW: 15.2 % (ref 11.5–15.5)
WBC: 6.8 10*3/uL (ref 4.0–10.5)

## 2016-11-17 LAB — RENAL FUNCTION PANEL
ALBUMIN: 2.3 g/dL — AB (ref 3.5–5.0)
ANION GAP: 9 (ref 5–15)
BUN: 10 mg/dL (ref 6–20)
CALCIUM: 8 mg/dL — AB (ref 8.9–10.3)
CO2: 24 mmol/L (ref 22–32)
Chloride: 101 mmol/L (ref 101–111)
Creatinine, Ser: 0.59 mg/dL — ABNORMAL LOW (ref 0.61–1.24)
GFR calc non Af Amer: >60 mL/min (ref >60)
GLUCOSE: 123 mg/dL — AB (ref 65–99)
PHOSPHORUS: 3.8 mg/dL (ref 2.5–4.6)
POTASSIUM: 4 mmol/L (ref 3.5–5.1)
SODIUM: 134 mmol/L — AB (ref 135–145)

## 2016-11-17 LAB — MAGNESIUM: Magnesium: 2.1 mg/dL (ref 1.7–2.4)

## 2016-11-17 NOTE — Progress Notes (Signed)
Nutrition Follow-up  DOCUMENTATION CODES:   Not applicable  INTERVENTION:   -Continue Jevity 1.2 at 55 ml/h with Prostat 30 ml TID to provide 1884 kcal, 118 gm protein, 1069 ml free water daily.  NUTRITION DIAGNOSIS:   Inadequate oral intake related to inability to eat as evidenced by NPO status.  Ongoing  GOAL:   Patient will meet greater than or equal to 90% of their needs  Met with TF  MONITOR:   Labs, I & O's, TF tolerance  REASON FOR ASSESSMENT:   Ventilator Enteral/tube feeding initiation and management  ASSESSMENT:   Pt admitted with acute onset of aphasia and left hemiplegia with occluded RT ICA, RT MCA, and RT ACA with complete revasuclaraztion.  11/15/16- Palliative consult pending; pt and family desire full scope of treatment 11/16/16- Per RNCM note, plan bed search for LTACH vs SNF  Pt remains on trach collar.   Pt continues on TF; noted Jevity 1.2 infusing via PEG at 55 ml/hr. Pt also recieivng 30 ml Prostat TID. Complete TF regimen providing 1884 kcals, 118 grams protein, and 1065 ml fluid (1365 ml fluid with 100 ml every 8 hour free water flush regimen), which provides 94% of estimated kcal needs and 100% of estimated protein needs  Labs reviewed: Na: 134, CBGS: 128-178.   Diet Order:  Diet NPO time specified  Skin:  Wound (see comment) (DPTI sacrum,partial thickness loss on stoma site)  Last BM:  11/15/16  Height:   Ht Readings from Last 1 Encounters:  11/10/16 5' 7" (1.702 m)    Weight:   Wt Readings from Last 1 Encounters:  11/17/16 182 lb (82.6 kg)    Ideal Body Weight:  80.9 kg  BMI:  Body mass index is 28.51 kg/m.  Estimated Nutritional Needs:   Kcal:  2000-2200  Protein:  105-120 grams  Fluid:  >2 L  EDUCATION NEEDS:   No education needs identified at this time  Coral Timme A. Jimmye Norman, RD, LDN, CDE Pager: [phone] After hours Pager: [phone]

## 2016-11-17 NOTE — Progress Notes (Signed)
CSW to continue to follow for snf needs.   Osborne Cascoadia Adriene Padula LCSWA [phone]

## 2016-11-17 NOTE — Progress Notes (Signed)
Daily Progress Note   Patient Name: Sean [name]       Date: 11/18/2016 DOB: [birth year]  Age: 82 y.o. MRN#: [id number] Attending Physician: Clydia LlanoMutaz Elmahi, MD Primary Care Physician: No primary care provider on file. Admit Date: 10/23/2016  Reason for Consultation/Follow-up: Establishing goals of care  Subjective: Sean [name] does not respond to voice or touch. His wife and grandson are at bedside. Grandson speaks English and says that they have talked as a family and focus seems to be on awaiting insurance approval for LTAC. No other decisions or questions.   I also called and spoke briefly with son, Sean [name], who was main contact in initial palliative discussion. Sean [name] also expresses that main concern is awaiting for LTAC approval. I asked if he had any further questions or thoughts on other decisions such as resuscitation but he does not at this time.   Length of Stay: 26  Current Medications: Scheduled Meds:  . apixaban  5 mg Oral BID  . bacitracin-polymyxin b   Right Eye QHS  . chlorhexidine  15 mL Mouth Rinse BID  . ciprofloxacin  2 drop Right Eye TID AC  . feeding supplement (PRO-STAT SUGAR FREE 64)  30 mL Per Tube TID  . free water  100 mL Per Tube Q8H  . hydrALAZINE  50 mg Per Tube Q8H  . insulin aspart  0-15 Units Subcutaneous Q4H  . pantoprazole sodium  40 mg Per Tube Daily  . sennosides  5 mL Per Tube BID    Continuous Infusions: . sodium chloride 10 mL/hr at 11/17/16 2040  . feeding supplement (JEVITY 1.2 CAL) 1,000 mL (11/17/16 1558)    PRN Meds: acetaminophen (TYLENOL) oral liquid 160 mg/5 mL, albuterol, bisacodyl, fentaNYL (SUBLIMAZE) injection, haloperidol lactate, labetalol, sodium chloride flush  Physical Exam          Vital Signs: BP (!) 145/75 (BP Location: Left  Arm)   Pulse 84   Temp 98.9 F (37.2 C) (Oral)   Resp 18   Ht 5\' 7"  (1.702 m)   Wt 75.8 kg (167 lb)   SpO2 100%   BMI 26.16 kg/m  SpO2: SpO2: 100 % O2 Device: O2 Device: Tracheostomy Collar O2 Flow Rate: O2 Flow Rate (L/min): 6 L/min  Intake/output summary:  Intake/Output Summary (Last 24 hours) at 11/18/16 0814 Last data  filed at 11/18/16 16100628  Gross per 24 hour  Intake          1560.34 ml  Output             1150 ml  Net           410.34 ml   LBM: Last BM Date: 11/15/16 Baseline Weight: Weight: 84.4 kg (186 lb) Most recent weight: Weight: 75.8 kg (167 lb)       Palliative Assessment/Data: PPS: 10%    Flowsheet Rows   Flowsheet Row Most Recent Value  Intake Tab  Referral Department  Hospitalist  Unit at Time of Referral  ICU  Palliative Care Primary Diagnosis  Neurology  Date Notified  11/12/16  Palliative Care Type  New Palliative care  Reason for referral  Clarify Goals of Care  Date of Admission  10/23/16  # of days IP prior to Palliative referral  20  Clinical Assessment  Psychosocial & Spiritual Assessment  Palliative Care Outcomes      Patient Active Problem List   Diagnosis Date Noted  . 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 Acute pulmonary edema (HCC)   . Chronic atrial fibrillation (HCC)   . Other hyperlipidemia   . Oral phase dysphagia   . Prolonged QT interval   . Goals of care, counseling/discussion   . Palliative care by specialist   . Advance care planning   . VAP (ventilator-associated pneumonia) (HCC)   . Cerebrovascular accident (CVA) (HCC)   . Bleeding   . Pressure injury of skin 11/10/2016  . Tracheostomy care (HCC)   . 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 Essential hypertension   . HCAP (healthcare-associated pneumonia)   . Acute respiratory failure with hypoxia (HCC)   . Cerebrovascular accident (CVA) due to embolism of right carotid artery (HCC)   .  Encounter for intubation   . Chronic a-fib (HCC)   . Dysphagia   . Acute blood loss anemia   . Thrombocytopenia (HCC)   . Benign essential HTN   . Bradycardia   . Pain   . Ventilator dependence (HCC)   . Cerebral embolism with cerebral infarction 10/23/2016  . Cerebrovascular accident (CVA) due to thrombosis of precerebral artery (HCC) 10/23/2016    Palliative Care Assessment & Plan   HPI: 82 y.o. male  with past medical history of hypertension, Afib admitted on 10/23/2016 with altered mental status and not moving his left side. Workup revealed R MCA CVA with thrombosis. He had mechanical thrombectomy. He has residual deficits including dysphagia, aphasia, respiratory failure (now with trach collar). He has developed HCAP. Palliative medicine consulted for GOC.     Assessment: Lying in bed, unresponsive, increased secretions.   Recommendations/Plan:  Secretions: Consider a few doses of low dose Robinul.   Goals of Care and Additional Recommendations:  Limitations on Scope of Treatment: Full Scope Treatment  Code Status:  Full code  Prognosis:   Unable to determine - very poor and at risk for acute decline.   Discharge Planning:  To Be Determined   Thank you for allowing the Palliative Medicine Team to assist in the care of this patient.   Time In: 1600 Time Out: 1620 Total Time 20min Prolonged Time Billed  no       Greater than 50%  of this time was spent counseling and coordinating care related to the above assessment and plan.  Yong ChannelAlicia Nika Yazzie, NP Palliative  Medicine Team Pager # [phone] (M-F 8a-5p) Team Phone # [phone] (Nights/Weekends)

## 2016-11-17 NOTE — Progress Notes (Signed)
Sean [name]  [facility]:[id number] DOB: June 08, 1926 DOA: 10/23/2016 PCP: No primary care provider on file.    Brief Narrative:  82 y/o M admitted 11/10 with c/o AMS, no movement on left side, unable to speak.  Found to have R ICA occlusion / MCA CVA.  To Neuro IR for thrombectomy, returned to ICU on vent.   Significant Events: 11/10 admit w/ acute CVA - to IR for recanalization of R ICA - intubated 11/16 tracheostomy and PEG placed  11/20 TRH assumed care   Subjective: The patient is not able to communicate. There is no family present.  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 LTAC also being considered.   Acute Respiratory Failure secondary to CVA Trach 11/16 - ongoing tracheostomy care per PCCM - on Trach collar withongoing greenish trach secretions. Per nursing staff the secretions have been quite significant. Patient does have coarse breath sounds with crackles bilaterally. Hasn't had any imaging studies recently. We will get a chest x-ray.  Chronic Afib Noncompliant w/ Pradaxa - rate controlled at this time - Eliquis ongoing    Essential hypertension Reasonably well controlled  Hyperlipidemia LDL 53 - f/u as outpt  Hypokalemia Corrected  Dysphagia  PEG 11/16 - continue tube feeds - abdom binder in place    Thrombocytopenia - resolved  unknown baseline, on chronic anticoagulation - platelet count has normalized   Normocytic Anemia No gross evidence of blood loss at this time - hemoglobin stable even on anticoag   Bleeding around Tracheostomy resolved  HCAP - ? Effusion Left base  Antibiotic therapy initiated by PCCM 11/17 - CXR w/o signif change 11/22 - has completed an abx course. Repeat chest x-ray from today is pending.   Hyperglycemia  A1c 6.2 - consistent with prediabetic state- stable for now    Goals of Care Palliative Care has met with the family - family is discussing options and is to contact Palliative Care when a decision has been made  DVT prophylaxis: eliquis Code Status: FULL CODE Family Communication: no family present at time of exam  Disposition Plan:  LTAC versus SNF placement being pursued. Repeat chest x-ray today due to increased secretions.   Consultants:  PCCM Neurology   Antimicrobials:  Ceftriaxone 11/17 > 11/22 Vanc 11/26 > 11/28  Objective: Blood pressure (!) 131/53, pulse 79, temperature 99.1 F (37.3 C), temperature source Axillary, resp. rate 18, height 5' 7"  (1.702 m), weight 82.6 kg (182 lb), SpO2 100 %.  Intake/Output Summary (Last 24 hours) at 11/17/16 1017 Last data filed at 11/17/16 0600  Gross per 24 hour  Intake             1560 ml  Output             1050 ml  Net              510 ml   Filed Weights   11/15/16 0404 11/16/16 0454 11/17/16 0500  Weight: 86.6 kg (191 lb) 89.8 kg (198 lb) 82.6 kg (182 lb)    Examination: General: Awake, alert. Appears to be distracted. Lungs: Coarse breath sounds bilaterally. Few crackles. No wheezing. Cardiovascular: S1, S2 is irregularly irregular. No S3, S4. No rubs, murmurs, or bruit Abdomen: Nondistended, soft, bowel sounds positive - PEG insertion clean/dry  Extremities: No edema bilateral lower extremities. Mitts noted  on the right hand. He has some kind of a splint over his left wrist.  CBC:  Recent Labs Lab 11/13/16 0920 11/14/16 0307 11/15/16 0145 11/16/16 0211 11/17/16 0523  WBC 6.6 6.2 5.2 6.3 6.8  NEUTROABS 4.7 4.2 3.3 4.2 4.9  HGB 9.8* 9.6* 9.2* 9.5* 9.6*  HCT 30.8* 30.3* 29.4* 29.0* 29.8*  MCV 89.3 87.3 88.3 87.9 87.4  PLT 334 368 340 257 034   Basic Metabolic Panel:  Recent Labs Lab 11/13/16 0709 11/14/16 0307 11/15/16 0145 11/16/16 0211 11/17/16 0523  NA 138 139 137 134* 134*  K 4.6 4.0 3.9 4.1 4.0  CL 103 106 103 103 101  CO2 24 26 25 24 24   GLUCOSE 141*  113* 142* 131* 123*  BUN 13 14 15 13 10   CREATININE 0.58* 0.57* 0.67 0.60* 0.59*  CALCIUM 8.0* 8.1* 7.9* 7.6* 8.0*  MG 2.2 2.3 2.2 2.1 2.1  PHOS 3.9 3.7 3.7 3.4 3.8   GFR: Estimated Creatinine Clearance: 65.6 mL/min (by C-G formula based on SCr of 0.59 mg/dL (L)).  Liver Function Tests:  Recent Labs Lab 11/13/16 0709 11/14/16 0307 11/15/16 0145 11/16/16 0211 11/17/16 0523  ALBUMIN 2.2* 2.1* 2.1* 2.3* 2.3*    HbA1C: Hgb A1c MFr Bld  Date/Time Value Ref Range Status  10/24/2016 05:00 AM 6.2 (H) 4.8 - 5.6 % Final    Comment:    (NOTE)         Pre-diabetes: 5.7 - 6.4         Diabetes: >6.4         Glycemic control for adults with diabetes: <7.0     CBG:  Recent Labs Lab 11/16/16 1614 11/16/16 2104 11/17/16 0012 11/17/16 0358 11/17/16 0748  GLUCAP 153* 164* 178* 132* 128*    Recent Results (from the past 240 hour(s))  Culture, respiratory (NON-Expectorated)     Status: None   Collection Time: 11/10/16 10:53 PM  Result Value Ref Range Status   Specimen Description TRACHEAL ASPIRATE  Final   Special Requests NONE  Final   Gram Stain   Final    FEW WBC PRESENT,BOTH PMN AND MONONUCLEAR MODERATE GRAM NEGATIVE RODS FEW GRAM POSITIVE RODS FEW GRAM POSITIVE COCCI IN PAIRS    Culture Consistent with normal respiratory flora.  Final   Report Status 11/12/2016 FINAL  Final     Scheduled Meds: . apixaban  5 mg Oral BID  . bacitracin-polymyxin b   Right Eye QHS  . chlorhexidine  15 mL Mouth Rinse BID  . ciprofloxacin  2 drop Right Eye TID AC  . feeding supplement (PRO-STAT SUGAR FREE 64)  30 mL Per Tube TID  . free water  100 mL Per Tube Q8H  . hydrALAZINE  50 mg Per Tube Q8H  . insulin aspart  0-15 Units Subcutaneous Q4H  . mouth rinse  15 mL Mouth Rinse 10 times per day  . pantoprazole sodium  40 mg Per Tube Daily  . sennosides  5 mL Per Tube BID   Continuous Infusions: . sodium chloride 10 mL/hr at 11/16/16 1800  . feeding supplement (JEVITY 1.2 CAL)  1,000 mL (11/16/16 1800)     LOS: 25 days   [place]  [phone] Pager - Text Page per Shea Evans as per below:  On-Call/Text Page:      [email or web address]      password [email or web address]  If 7PM-7AM, please contact night-coverage [email or web address] Password [email or web address] 11/17/2016, 10:17 AM

## 2016-11-17 NOTE — Progress Notes (Signed)
CSW to continue to follow for snf needs.   Jemery Stacey LCSWA [phone]  

## 2016-11-17 NOTE — Progress Notes (Signed)
Physical Therapy Treatment Patient Details Name: Sean AbedKong Degregorio MRN: [id number] DOB: 06-26-1926 Today's Date: 11/17/2016    History of Present Illness Sean [name] is a 82 y.o. male admitted with He has expressive aphasia and Not moving left side with right-sided gaze deviation. MRI: Areas of acute infarction in the right MCA territory involving the insula, frontal operculum, anteromedial temporal lobe, and lateral temporal parietal junction s/p revascularization.    PT Comments    The pt tolerated sitting EOB with total assistance. Pt remains lethargic in sitting.  Pt would benefit from further PT to increase his functional independence as much as possible.  Continue with POC.  Follow Up Recommendations  SNF     Equipment Recommendations  None recommended by PT    Recommendations for Other Services OT consult;Rehab consult     Precautions / Restrictions Precautions Precautions: Fall Precaution Comments: trach, peg, condom cath Restrictions Weight Bearing Restrictions: No    Mobility  Bed Mobility Overal bed mobility: Needs Assistance;+2 for physical assistance Bed Mobility: Rolling;Supine to Sit;Sit to Supine Rolling: Total assist;+2 for physical assistance   Supine to sit: Total assist;+2 for physical assistance Sit to supine: Total assist;+2 for physical assistance   General bed mobility comments: Total A +2 to navigate LEs off of bed and elevate trunk due to arousal level.  Transfers                    Ambulation/Gait                 Stairs            Wheelchair Mobility    Modified Rankin (Stroke Patients Only)       Balance Overall balance assessment: Needs assistance Sitting-balance support: Bilateral upper extremity supported;Feet unsupported Sitting balance-Leahy Scale: Poor Sitting balance - Comments: Unable to arouse pt.  Required constant support to maintain sitting on EOB.  Assistance to hold pts head up and rotate it to the left.   Pt pushing and resisting repositioning and not aware of verbal cues from SPTA, PTA, and wife.  Pt kept reaching non-purposely with RUE. Postural control: Posterior lean                          Cognition Arousal/Alertness: Lethargic Behavior During Therapy: Flat affect Overall Cognitive Status: Difficult to assess                      Exercises      General Comments        Pertinent Vitals/Pain Pain Assessment: Faces Faces Pain Scale: Hurts little more Pain Location: with repositioning Pain Descriptors / Indicators: Grimacing;Guarding Pain Intervention(s): Monitored during session;Repositioned    Home Living                      Prior Function            PT Goals (current goals can now be found in the care plan section) Acute Rehab PT Goals Patient Stated Goal: unable PT Goal Formulation: Patient unable to participate in goal setting Time For Goal Achievement: 11/09/16 Potential to Achieve Goals: Fair Progress towards PT goals:  (Difficult to determine this session.)    Frequency    Min 3X/week      PT Plan Current plan remains appropriate    Co-evaluation             End of Session Equipment Utilized During  Treatment: Other (comment) Activity Tolerance: Treatment limited secondary to medical complications (Comment) Patient left: in bed;with family/visitor present;with bed alarm set;with nursing/sitter in room     Time: 1440-1505 PT Time Calculation (min) (ACUTE ONLY): 25 min  Charges:  $Therapeutic Activity: 23-37 mins                    G Codes:      Cathleen CortiMary Kiahna [name] 11/17/2016, 5:18 PM Zelphia CairoMary R. Tristen [name], Leda GauzeSPTA [phone]

## 2016-11-17 NOTE — Care Management Important Message (Signed)
Important Message  Patient Details  Name: Sean [name] MRN: [id number] Date of Birth: January 11, 1926   Medicare Important Message Given:  Yes    Kyla BalzarineShealy, Kalayla Shadden Abena 11/17/2016, 9:42 AM

## 2016-11-18 DIAGNOSIS — I639 Cerebral infarction, unspecified: Secondary | ICD-10-CM | POA: Diagnosis present

## 2016-11-18 LAB — BASIC METABOLIC PANEL
Anion gap: 9 (ref 5–15)
BUN: 15 mg/dL (ref 6–20)
CHLORIDE: 98 mmol/L — AB (ref 101–111)
CO2: 26 mmol/L (ref 22–32)
CREATININE: 0.67 mg/dL (ref 0.61–1.24)
Calcium: 8 mg/dL — ABNORMAL LOW (ref 8.9–10.3)
GFR calc non Af Amer: >60 mL/min (ref >60)
Glucose, Bld: 164 mg/dL — ABNORMAL HIGH (ref 65–99)
Potassium: 4 mmol/L (ref 3.5–5.1)
Sodium: 133 mmol/L — ABNORMAL LOW (ref 135–145)

## 2016-11-18 LAB — CBC
HCT: 29.5 % — ABNORMAL LOW (ref 39.0–52.0)
Hemoglobin: 9.5 g/dL — ABNORMAL LOW (ref 13.0–17.0)
MCH: 27.9 pg (ref 26.0–34.0)
MCHC: 32.2 g/dL (ref 30.0–36.0)
MCV: 86.8 fL (ref 78.0–100.0)
PLATELETS: 262 10*3/uL (ref 150–400)
RBC: 3.4 MIL/uL — ABNORMAL LOW (ref 4.22–5.81)
RDW: 14.9 % (ref 11.5–15.5)
WBC: 6.2 10*3/uL (ref 4.0–10.5)

## 2016-11-18 LAB — GLUCOSE, CAPILLARY
GLUCOSE-CAPILLARY: 155 mg/dL — AB (ref 65–99)
GLUCOSE-CAPILLARY: 165 mg/dL — AB (ref 65–99)
GLUCOSE-CAPILLARY: 168 mg/dL — AB (ref 65–99)
Glucose-Capillary: 132 mg/dL — ABNORMAL HIGH (ref 65–99)
Glucose-Capillary: 155 mg/dL — ABNORMAL HIGH (ref 65–99)
Glucose-Capillary: 121 mg/dL — ABNORMAL HIGH (ref 65–99)
Glucose-Capillary: 129 mg/dL — ABNORMAL HIGH (ref 65–99)

## 2016-11-18 NOTE — Progress Notes (Addendum)
CM spoke with Carrina/[employer] @ [phone] regarding insurance approval for LTAC and learned pt was not approved for LTAC. CARRINA stated insurance would need an extensive list of SNF denials in order for insurance to consider approval for LTAC. CM made CSW aware. Gae GallopAngela Zeke Aker RN,BSN,CM

## 2016-11-18 NOTE — Progress Notes (Signed)
CSW spoke with patient's insurance company. They will do an out of state snf assessment. They require 48 hours to review. Sean [name]([name], phone: [phone] fax: [phone])  Sean GoldmannNadia Heidy [name] LCSWA [phone]

## 2016-11-18 NOTE — Progress Notes (Signed)
Occupational Therapy Treatment Patient Details Name: Sean AbedKong Nasby MRN: [id number] DOB: [birth year] Today's Date: 11/18/2016    History of present illness Sean [name] is a 82 y.o. male admitted with He has expressive aphasia and Not moving left side with right-sided gaze deviation. MRI: Areas of acute infarction in the right MCA territory involving the insula, frontal operculum, anteromedial temporal lobe, and lateral temporal parietal junction s/p revascularization.   OT comments  Pt arouses to voice and touch intermittently this session; family reports pt appropriately responding to questions via head nods when alert. Educated family on LUE PROM, retrograde massage, and elevation for edema control. Pt tolerated L UE shoulder, elbow,wrist, hand PROM x10 reps each and retrograde massage this session. Pt noted to have increased edema in L hand/palm; discussed with RN modifying splint schedule for edema control. D/c plan remains appropriate. Will continue to follow acutely.   Follow Up Recommendations  SNF;Supervision/Assistance - 24 hour    Equipment Recommendations  Other (comment) (TBD at next venue)    Recommendations for Other Services      Precautions / Restrictions Precautions Precautions: Fall Precaution Comments: trach, peg, condom cath Restrictions Weight Bearing Restrictions: No       Mobility Bed Mobility                  Transfers                      Balance                                   ADL Overall ADL's : Needs assistance/impaired                                       General ADL Comments: total assist for ADL.      Vision                     Perception     Praxis      Cognition   Behavior During Therapy: Flat affect Overall Cognitive Status: Difficult to assess                  General Comments: Family reports pt appropriately responding with head nods. Intermittently arousable  today.    Extremity/Trunk Assessment               Exercises Other Exercises Other Exercises: Pt able to cross midline x2 when alert to follow wifes voice. Other Exercises: Educated pts grandson and wife on PROM for LUE, retrograde massage for edema management, and elevation. Other Exercises: Pt tolerated PROM L shoulder, elbow, wrist, digits x 10 each. Other Exercises: Pt noted to have increased edema in L hand/palm. Tolerated retrograde massage but did report increase in pain with touch.   Shoulder Instructions       General Comments      Pertinent Vitals/ Pain       Pain Assessment: Faces Faces Pain Scale: Hurts even more Pain Location: L hand Pain Descriptors / Indicators: Grimacing  Home Living                                          Prior Functioning/Environment  Frequency  Min 2X/week        Progress Toward Goals  OT Goals(current goals can now be found in the care plan section)  Progress towards OT goals: Not progressing toward goals - comment (decreased arousal)  Acute Rehab OT Goals Patient Stated Goal: unable OT Goal Formulation: Patient unable to participate in goal setting  Plan Discharge plan remains appropriate    Co-evaluation                 End of Session Equipment Utilized During Treatment: Oxygen   Activity Tolerance Patient tolerated treatment well;Patient limited by lethargy   Patient Left in bed;with call bell/phone within reach;with family/visitor present   Nurse Communication Mobility status;Other (comment) (discussed modifying splint schedule for edema management)        Time: [id number] OT Time Calculation (min): 20 min  Charges: OT General Charges $OT Visit: 1 Procedure OT Treatments $Therapeutic Exercise: 8-22 mins  Gaye AlkenBailey A Orel Cooler M.S., OTR/L Pager: [phone]  11/18/2016, 3:29 PM

## 2016-11-18 NOTE — Progress Notes (Signed)
Sean [name]  QIO:[id number] DOB: 09/11/1926 DOA: 10/23/2016 PCP: No primary care provider on file.    Brief Narrative:  82 y/o M admitted 11/10 with c/o AMS, no movement on left side, unable to speak.  Found to have R ICA occlusion / MCA CVA.  To Neuro IR for thrombectomy, returned to ICU on vent. Prolonged stay on a ventilator, ended up with tracheostomy and PEG tube. Currently awaiting LTAC versus skilled nursing facility, family favors LTAC.  Significant Events: 11/10 admit w/ acute CVA - to IR for recanalization of R ICA - intubated 11/16 tracheostomy and PEG placed  11/20 [facility] assumed care   Subjective: The patient is not able to communicate. There is no family present. 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 LTAC also being considered.   Acute Respiratory Failure secondary to CVA Trach 11/16 - ongoing tracheostomy care per PCCM - on Trach collar withongoing greenish trach secretions. Per nursing staff the secretions have been quite significant. Patient does have coarse breath sounds with crackles bilaterally. Hasn't had any imaging studies recently. -CXR showed stable interstitial prominence suggesting low-grade CHF, new right density atelectasis versus pneumonia.  Chronic Afib Noncompliant w/ Pradaxa - rate controlled at this time - Eliquis ongoing    Essential hypertension Reasonably well controlled  Hyperlipidemia LDL 53 - f/u as outpt  Hypokalemia Corrected  Dysphagia  PEG 11/16 - continue tube feeds - abdom binder in place    Thrombocytopenia - resolved  unknown baseline, on chronic anticoagulation - platelet count has normalized   Normocytic Anemia No gross evidence of blood loss at this time - hemoglobin stable even on anticoag    Bleeding around Tracheostomy resolved  HCAP - ? Effusion Left base  Antibiotic therapy initiated by PCCM 11/17 - CXR w/o signif change 11/22 - has completed an abx course. Repeat chest x-ray from today is pending.   Hyperglycemia  A1c 6.2 - consistent with prediabetic state- stable for now   Goals of Care Palliative Care has met with the family, per notes family wants patient to go to [facility] versus SNF.  DVT prophylaxis: eliquis Code Status: FULL CODE Family Communication: no family present at time of exam  Disposition Plan:  LTAC versus SNF placement being pursued. Repeat chest x-ray today due to increased secretions.   Consultants:  PCCM Neurology   Antimicrobials:  Ceftriaxone 11/17 > 11/22 Vanc 11/26 > 11/28  Objective: Blood pressure (!) 145/75, pulse 84, temperature 98.9 F (37.2 C), temperature source Oral, resp. rate 18, height 5' 7"  (1.702 m), weight 75.8 kg (167 lb), SpO2 100 %.  Intake/Output Summary (Last 24 hours) at 11/18/16 1159 Last data filed at 11/18/16 0943  Gross per 24 hour  Intake          1560.34 ml  Output             1650 ml  Net           -89.66 ml   Filed Weights   11/16/16 0454 11/17/16 0500 11/18/16 0500  Weight: 89.8 kg (198 lb) 82.6 kg (182 lb) 75.8 kg (167 lb)    Examination: General: Awake, alert. Appears to be distracted. Lungs: Coarse breath sounds bilaterally. Few crackles. No wheezing. Cardiovascular: S1, S2 is irregularly irregular. No  S3, S4. No rubs, murmurs, or bruit Abdomen: Nondistended, soft, bowel sounds positive - PEG insertion clean/dry  Extremities: No edema bilateral lower extremities. Mitts noted on the right hand. He has some kind of a splint over his left wrist.  CBC:  Recent Labs Lab 11/13/16 0920 11/14/16 0307 11/15/16 0145 11/16/16 0211 11/17/16 0523 11/18/16 0559  WBC 6.6 6.2 5.2 6.3 6.8 6.2  NEUTROABS 4.7 4.2 3.3 4.2 4.9  --   HGB 9.8* 9.6* 9.2* 9.5* 9.6* 9.5*  HCT 30.8* 30.3* 29.4* 29.0* 29.8*  29.5*  MCV 89.3 87.3 88.3 87.9 87.4 86.8  PLT 334 368 340 257 265 945   Basic Metabolic Panel:  Recent Labs Lab 11/13/16 0709 11/14/16 0307 11/15/16 0145 11/16/16 0211 11/17/16 0523 11/18/16 0559  NA 138 139 137 134* 134* 133*  K 4.6 4.0 3.9 4.1 4.0 4.0  CL 103 106 103 103 101 98*  CO2 24 26 25 24 24 26   GLUCOSE 141* 113* 142* 131* 123* 164*  BUN 13 14 15 13 10 15   CREATININE 0.58* 0.57* 0.67 0.60* 0.59* 0.67  CALCIUM 8.0* 8.1* 7.9* 7.6* 8.0* 8.0*  MG 2.2 2.3 2.2 2.1 2.1  --   PHOS 3.9 3.7 3.7 3.4 3.8  --    GFR: Estimated Creatinine Clearance: 59.7 mL/min (by C-G formula based on SCr of 0.67 mg/dL).  Liver Function Tests:  Recent Labs Lab 11/13/16 0709 11/14/16 0307 11/15/16 0145 11/16/16 0211 11/17/16 0523  ALBUMIN 2.2* 2.1* 2.1* 2.3* 2.3*    HbA1C: Hgb A1c MFr Bld  Date/Time Value Ref Range Status  10/24/2016 05:00 AM 6.2 (H) 4.8 - 5.6 % Final    Comment:    (NOTE)         Pre-diabetes: 5.7 - 6.4         Diabetes: >6.4         Glycemic control for adults with diabetes: <7.0     CBG:  Recent Labs Lab 11/17/16 2107 11/18/16 0107 11/18/16 0505 11/18/16 0744 11/18/16 1118  GLUCAP 130* 132* 155* 121* 155*    Recent Results (from the past 240 hour(s))  Culture, respiratory (NON-Expectorated)     Status: None   Collection Time: 11/10/16 10:53 PM  Result Value Ref Range Status   Specimen Description TRACHEAL ASPIRATE  Final   Special Requests NONE  Final   Gram Stain   Final    FEW WBC PRESENT,BOTH PMN AND MONONUCLEAR MODERATE GRAM NEGATIVE RODS FEW GRAM POSITIVE RODS FEW GRAM POSITIVE COCCI IN PAIRS    Culture Consistent with normal respiratory flora.  Final   Report Status 11/12/2016 FINAL  Final     Scheduled Meds: . apixaban  5 mg Oral BID  . bacitracin-polymyxin b   Right Eye QHS  . chlorhexidine  15 mL Mouth Rinse BID  . ciprofloxacin  2 drop Right Eye TID AC  . feeding supplement (PRO-STAT SUGAR FREE 64)  30 mL Per Tube TID  .  free water  100 mL Per Tube Q8H  . hydrALAZINE  50 mg Per Tube Q8H  . insulin aspart  0-15 Units Subcutaneous Q4H  . pantoprazole sodium  40 mg Per Tube Daily  . sennosides  5 mL Per Tube BID   Continuous Infusions: . sodium chloride 10 mL/hr at 11/17/16 2040  . feeding supplement (JEVITY 1.2 CAL) 1,000 mL (11/17/16 1558)     LOS: 26 days   [facility] Office  [phone] Pager - Text Page per Shea Evans as per  below:  On-Call/Text Page:      [email or web address]      password [email or web address]  If 7PM-7AM, please contact night-coverage [email or web address] Password [email or web address] 11/18/2016, 11:59 AM

## 2016-11-19 ENCOUNTER — Inpatient Hospital Stay (HOSPITAL_COMMUNITY): Payer: Medicare (Managed Care)

## 2016-11-19 LAB — GLUCOSE, CAPILLARY
GLUCOSE-CAPILLARY: 159 mg/dL — AB (ref 65–99)
Glucose-Capillary: 147 mg/dL — ABNORMAL HIGH (ref 65–99)
Glucose-Capillary: 138 mg/dL — ABNORMAL HIGH (ref 65–99)
Glucose-Capillary: 153 mg/dL — ABNORMAL HIGH (ref 65–99)
Glucose-Capillary: 115 mg/dL — ABNORMAL HIGH (ref 65–99)

## 2016-11-19 LAB — BASIC METABOLIC PANEL
Anion gap: 7 (ref 5–15)
BUN: 15 mg/dL (ref 6–20)
CHLORIDE: 103 mmol/L (ref 101–111)
CO2: 26 mmol/L (ref 22–32)
CREATININE: 0.64 mg/dL (ref 0.61–1.24)
Calcium: 8 mg/dL — ABNORMAL LOW (ref 8.9–10.3)
GFR calc non Af Amer: >60 mL/min (ref >60)
Glucose, Bld: 158 mg/dL — ABNORMAL HIGH (ref 65–99)
POTASSIUM: 4.1 mmol/L (ref 3.5–5.1)
Sodium: 136 mmol/L (ref 135–145)

## 2016-11-19 MED ORDER — FUROSEMIDE 10 MG/ML IJ SOLN
40.0000 mg | Freq: Once | INTRAMUSCULAR | Status: AC
  Administered 2016-11-19: 40 mg via INTRAVENOUS
  Filled 2016-11-19: qty 4

## 2016-11-19 NOTE — Progress Notes (Signed)
Called to room by RN stating that pt had coughed trach out. Upon our arrival trach was hanging out of stoma. Trach replaced using obturator with no complications. Pt placed back on 28% ATC. Suctioned thick bloody, tan secretions. Janina Mayorach ties and gauze replaced. EtCO2 showed positive color change and CXR showed good trach placement. RT will continue to monitor.

## 2016-11-19 NOTE — Progress Notes (Signed)
Patient coughed out trach. Respiratory replaced. Patient is now stabilized. Coughing up bloody phlegm. Chest x-ray completed. Continuous pulse ox is being maintained. Dr. Merdis DelayK. Schorr paged twice, 40 minutes apart, to be alerted of event. Awaiting return phone call. Will continue to monitor patient.

## 2016-11-19 NOTE — Significant Event (Addendum)
Rapid Response Event Note Called per Floor RN regarding Pt with Trach dislodged, arrived STAT with Respiratory Therapist  Overview: Time Called: 0314 Arrival Time: 0315 Event Type: Respiratory  Initial Focused Assessment/Interventions: Pt found with trach dislodged. Minimally responsive but RR 20s and Pt appears uncomfortable. Replaced tracheostomy per RT quickly upon arrival. Po2 sats dropped to 76% but quickly improved once trach replaced. Deep tracheal suction provided yielding moderate blood secretions. Lung sounds course. Pt back to neuro baseline flaccid on left, does not follow commands. Janina Mayorach ties replaced per RT. CXR ordered and completed STAT. VSS HR 100s, BP 139/70 RR 12-15, po2 96-100%. EtCO2 showed positive color change and CXR showed good trach placement. RT will continue to monitor.  Jerilynn MagesK. Schor [facility] NP paged.   Plan of Care (if not transferred): RN to monitor Pt closely. RN to update [facility] NP and to follow CXR. RN to call RRT and Provider for worsening changes. RRT will follow tonight.   Event Summary: Name of Physician Notified: Jerilynn MagesK. Schor [facility] NP at [phone]    at    Outcome: Stayed in room and stabalized  Event End Time: 0345  Lolita RiegerWhite, Katriona Schmierer Leigh Tahira Olivarez

## 2016-11-19 NOTE — Progress Notes (Signed)
CSW has submitted snf review to out of state insurance. They will need to negotiate a contract with [employer].   Osborne Cascoadia Shereese Bonnie LCSWA [phone]

## 2016-11-19 NOTE — Progress Notes (Signed)
Physical Therapy Treatment Patient Details Name: Sean AbedKong Walrath MRN: [id number] DOB: 05-05-1926 Today's Date: 11/19/2016    History of Present Illness Sean [name] is a 82 y.o. male admitted with He has expressive aphasia and Not moving left side with right-sided gaze deviation. MRI: Areas of acute infarction in the right MCA territory involving the insula, frontal operculum, anteromedial temporal lobe, and lateral temporal parietal junction s/p revascularization.    PT Comments    Pt participated in B LE therapeutic exercise to improve strength and ROM to prevent contractures.  Pt activelty moving R UE.  Difficult to differentiate tone vs. Spasticity due to language barrier.    Follow Up Recommendations  SNF     Equipment Recommendations  None recommended by PT    Recommendations for Other Services       Precautions / Restrictions Precautions Precautions: Fall Precaution Comments: trach, peg, condom cath Restrictions Weight Bearing Restrictions: No    Mobility  Bed Mobility               General bed mobility comments: deferred ther ex with emphasis placed on therapeutic exercise.    Transfers                    Ambulation/Gait                 Stairs            Wheelchair Mobility    Modified Rankin (Stroke Patients Only)       Balance                                    Cognition Arousal/Alertness: Lethargic Behavior During Therapy: Flat affect Overall Cognitive Status: Difficult to assess                 General Comments: Pt awake but difficult to assess due to language barrier.      Exercises General Exercises - Lower Extremity Heel Slides: AAROM;PROM;Both;20 reps Hip ABduction/ADduction: AAROM;PROM;20 reps;Both Straight Leg Raises: AAROM;PROM;Both;20 reps Other Exercises Other Exercises: R UE exercises in multiple planes including, shoulder flexion, elbow flexion and digit flexion.      General Comments         Pertinent Vitals/Pain Pain Assessment: Faces Faces Pain Scale: No hurt Pain Intervention(s): Monitored during session    Home Living                      Prior Function            PT Goals (current goals can now be found in the care plan section) Acute Rehab PT Goals Patient Stated Goal: unable Potential to Achieve Goals: Fair Progress towards PT goals: Not progressing toward goals - comment    Frequency    Min 3X/week      PT Plan Current plan remains appropriate    Co-evaluation             End of Session   Activity Tolerance: Other (comment) (Tx remains limited due to language barrier.  family not present.  ) Patient left: in bed;with family/visitor present;with bed alarm set;with nursing/sitter in room     Time: [phone] PT Time Calculation (min) (ACUTE ONLY): 20 min  Charges:  $Therapeutic Exercise: 8-22 mins                    G Codes:  Sean [name] 11/19/2016, 1:42 PM  Sean [name], PTA pager [phone]

## 2016-11-19 NOTE — Consult Note (Signed)
WOC Nurse wound follow up Wound type: DTI that has evolved to Stage 2 Pressure injury Measurement: 6.0cm x 4.0cm x 0.2cm  Wound bed: 100% pink, some healing at wound edges, clean now Drainage (amount, consistency, odor) minimal, serosanguinous, no odor Periwound: intact  Dressing procedure/placement/frequency: Continue silicone foam dressing, must be on low air loss mattress for pressure redistribution. Continue Prevalon boots for offloading heels.  WOC Nurse team will follow along with you for weekly wound assessments.  Please notify me of any acute changes in the wounds or any new areas of concerns Aakash Hollomon [facility] MSN, RN,CWOCN, CNS [phone]

## 2016-11-19 NOTE — Progress Notes (Signed)
Palliative:  Attempted to follow up with [name]'s family today. Wife and grandson at bedside and grandson speaks [place] but they rely on Mr. Karie Schwalbe iong's son, [name], who I reached by phone. [name]' main concern at this time is for placement. I attempted to follow up on decision for resuscitation and expectations but continued to focus on placement and LTAC. [facility].   No charge  Yong ChannelAlicia Aniyia Rane, NP Palliative Medicine Team Pager # [phone] (M-F 8a-5p) Team Phone # [phone] (Nights/Weekends)

## 2016-11-19 NOTE — Progress Notes (Signed)
Sean [name]  XNA:[id number] DOB: 03-Jan-1926 DOA: 10/23/2016 PCP: No primary care provider on file.    Brief Narrative:  82 y/o M admitted 11/10 with c/o AMS, no movement on left side, unable to speak.  Found to have R ICA occlusion / MCA CVA.  To Neuro IR for thrombectomy, returned to ICU on vent. Prolonged stay on a ventilator, ended up with tracheostomy and PEG tube. Currently awaiting LTAC versus skilled nursing facility, family favors LTAC.  Significant Events: 11/10 admit w/ acute CVA - to IR for recanalization of R ICA - intubated 11/16 tracheostomy and PEG placed  11/20 [facility] assumed care   Subjective: No family at bedside, nonverbal, does not follow simple commands. Appears with increased secretions coming out of his trach, Per report he coughed out his trach this morning.  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 LTAC also being considered.   Acute Respiratory Failure secondary to CVA Trach 11/16 - ongoing tracheostomy care per PCCM - on Trach collar withongoing greenish trach secretions. Per nursing staff the secretions have been quite significant. Patient does have coarse breath sounds with crackles bilaterally. Hasn't had any imaging studies recently. -CXR showed stable interstitial prominence suggesting low-grade CHF, new right density atelectasis versus pneumonia. -Given 1 dose of Lasix yesterday, switched to less trach. Continue deep suction and trach care.  Chronic Afib Noncompliant w/ Pradaxa - rate controlled at this time - Eliquis ongoing    Essential hypertension Reasonably well controlled  Hyperlipidemia LDL 53 - f/u as outpt  Hypokalemia Corrected  Dysphagia  PEG 11/16 - continue tube feeds - abdom binder in place    Thrombocytopenia - resolved  unknown baseline, on chronic  anticoagulation - platelet count has normalized   Normocytic Anemia No gross evidence of blood loss at this time - hemoglobin stable even on anticoag   Bleeding around Tracheostomy resolved  HCAP - ? Effusion Left base  Antibiotic therapy initiated by PCCM 11/17 - CXR w/o signif change 11/22 - has completed an abx course.    Hyperglycemia  A1c 6.2 - consistent with prediabetic state- stable for now   Goals of Care Palliative Care has met with the family, per notes family wants patient to go to [facility] versus SNF.   DVT prophylaxis: Eliquis Code Status: FULL CODE Family Communication: no family present at time of exam  Disposition Plan:  LTAC versus SNF placement being pursued. Repeat chest x-ray today due to increased secretions.   Consultants:  PCCM Neurology   Antimicrobials:  Ceftriaxone 11/17 > 11/22 Vanc 11/26 > 11/28  Objective: Blood pressure (!) 135/56, pulse 73, temperature 98.4 F (36.9 C), temperature source Oral, resp. rate 17, height 5' 7"  (1.702 m), weight 75.8 kg (167 lb), SpO2 100 %.  Intake/Output Summary (Last 24 hours) at 11/19/16 1229 Last data filed at 11/19/16 1033  Gross per 24 hour  Intake          2158.58 ml  Output             1475 ml  Net           683.58 ml   Filed Weights   11/17/16 0500 11/18/16 0500 11/19/16 0451  Weight: 82.6 kg (182 lb) 75.8 kg (167 lb) 75.8 kg (167 lb)    Examination: General: Awake, alert. Appears to  be distracted. Lungs: Coarse breath sounds bilaterally. Few crackles. No wheezing. Cardiovascular: S1, S2 is irregularly irregular. No S3, S4. No rubs, murmurs, or bruit Abdomen: Nondistended, soft, bowel sounds positive - PEG insertion clean/dry  Extremities: No edema bilateral lower extremities. Mitts noted on the right hand. He has some kind of a splint over his left wrist.  CBC:  Recent Labs Lab 11/13/16 0920 11/14/16 0307 11/15/16 0145 11/16/16 0211 11/17/16 0523 11/18/16 0559  WBC 6.6 6.2 5.2 6.3 6.8  6.2  NEUTROABS 4.7 4.2 3.3 4.2 4.9  --   HGB 9.8* 9.6* 9.2* 9.5* 9.6* 9.5*  HCT 30.8* 30.3* 29.4* 29.0* 29.8* 29.5*  MCV 89.3 87.3 88.3 87.9 87.4 86.8  PLT 334 368 340 257 265 242   Basic Metabolic Panel:  Recent Labs Lab 11/13/16 0709 11/14/16 0307 11/15/16 0145 11/16/16 0211 11/17/16 0523 11/18/16 0559 11/19/16 0507  NA 138 139 137 134* 134* 133* 136  K 4.6 4.0 3.9 4.1 4.0 4.0 4.1  CL 103 106 103 103 101 98* 103  CO2 24 26 25 24 24 26 26   GLUCOSE 141* 113* 142* 131* 123* 164* 158*  BUN 13 14 15 13 10 15 15   CREATININE 0.58* 0.57* 0.67 0.60* 0.59* 0.67 0.64  CALCIUM 8.0* 8.1* 7.9* 7.6* 8.0* 8.0* 8.0*  MG 2.2 2.3 2.2 2.1 2.1  --   --   PHOS 3.9 3.7 3.7 3.4 3.8  --   --    GFR: Estimated Creatinine Clearance: 59.7 mL/min (by C-G formula based on SCr of 0.64 mg/dL).  Liver Function Tests:  Recent Labs Lab 11/13/16 0709 11/14/16 0307 11/15/16 0145 11/16/16 0211 11/17/16 0523  ALBUMIN 2.2* 2.1* 2.1* 2.3* 2.3*    HbA1C: Hgb A1c MFr Bld  Date/Time Value Ref Range Status  10/24/2016 05:00 AM 6.2 (H) 4.8 - 5.6 % Final    Comment:    (NOTE)         Pre-diabetes: 5.7 - 6.4         Diabetes: >6.4         Glycemic control for adults with diabetes: <7.0     CBG:  Recent Labs Lab 11/18/16 2011 11/18/16 2359 11/19/16 0447 11/19/16 0742 11/19/16 1211  GLUCAP 165* 168* 159* 147* 138*    Recent Results (from the past 240 hour(s))  Culture, respiratory (NON-Expectorated)     Status: None   Collection Time: 11/10/16 10:53 PM  Result Value Ref Range Status   Specimen Description TRACHEAL ASPIRATE  Final   Special Requests NONE  Final   Gram Stain   Final    FEW WBC PRESENT,BOTH PMN AND MONONUCLEAR MODERATE GRAM NEGATIVE RODS FEW GRAM POSITIVE RODS FEW GRAM POSITIVE COCCI IN PAIRS    Culture Consistent with normal respiratory flora.  Final   Report Status 11/12/2016 FINAL  Final     Scheduled Meds: . apixaban  5 mg Oral BID  . bacitracin-polymyxin b    Right Eye QHS  . chlorhexidine  15 mL Mouth Rinse BID  . ciprofloxacin  2 drop Right Eye TID AC  . feeding supplement (PRO-STAT SUGAR FREE 64)  30 mL Per Tube TID  . free water  100 mL Per Tube Q8H  . hydrALAZINE  50 mg Per Tube Q8H  . insulin aspart  0-15 Units Subcutaneous Q4H  . pantoprazole sodium  40 mg Per Tube Daily  . sennosides  5 mL Per Tube BID   Continuous Infusions: . sodium chloride 10 mL/hr at 11/19/16 0400  .  feeding supplement (JEVITY 1.2 CAL) 1,000 mL (11/19/16 1128)     LOS: 27 days   [facility] Office  [phone] Pager - Text Page per Shea Evans as per below:  On-Call/Text Page:      [email or web address]      password [email or web address]  If 7PM-7AM, please contact night-coverage [email or web address] Password [facility] 11/19/2016, 12:29 PM

## 2016-11-20 LAB — BASIC METABOLIC PANEL
Anion gap: 10 (ref 5–15)
BUN: 15 mg/dL (ref 6–20)
CO2: 27 mmol/L (ref 22–32)
Calcium: 8.1 mg/dL — ABNORMAL LOW (ref 8.9–10.3)
Chloride: 100 mmol/L — ABNORMAL LOW (ref 101–111)
Creatinine, Ser: 0.61 mg/dL (ref 0.61–1.24)
GFR calc Af Amer: >60 mL/min (ref >60)
GLUCOSE: 119 mg/dL — AB (ref 65–99)
POTASSIUM: 3.8 mmol/L (ref 3.5–5.1)
Sodium: 137 mmol/L (ref 135–145)

## 2016-11-20 LAB — GLUCOSE, CAPILLARY
GLUCOSE-CAPILLARY: 156 mg/dL — AB (ref 65–99)
Glucose-Capillary: 137 mg/dL — ABNORMAL HIGH (ref 65–99)
Glucose-Capillary: 137 mg/dL — ABNORMAL HIGH (ref 65–99)
Glucose-Capillary: 171 mg/dL — ABNORMAL HIGH (ref 65–99)

## 2016-11-20 LAB — CBC
HEMATOCRIT: 30.6 % — AB (ref 39.0–52.0)
Hemoglobin: 9.7 g/dL — ABNORMAL LOW (ref 13.0–17.0)
MCH: 28 pg (ref 26.0–34.0)
MCHC: 31.7 g/dL (ref 30.0–36.0)
MCV: 88.2 fL (ref 78.0–100.0)
Platelets: 218 10*3/uL (ref 150–400)
RBC: 3.47 MIL/uL — ABNORMAL LOW (ref 4.22–5.81)
RDW: 15.5 % (ref 11.5–15.5)
WBC: 5.9 10*3/uL (ref 4.0–10.5)

## 2016-11-20 MED ORDER — PRO-STAT SUGAR FREE PO LIQD: 30.0000 mL | Freq: Three times a day (TID) | ORAL | 0 refills | Status: AC

## 2016-11-20 MED ORDER — FUROSEMIDE 20 MG PO TABS: 20.0000 mg | ORAL_TABLET | Freq: Every day | ORAL | Status: AC

## 2016-11-20 MED ORDER — APIXABAN 5 MG PO TABS: 5.0000 mg | ORAL_TABLET | Freq: Two times a day (BID) | ORAL | Status: AC

## 2016-11-20 MED ORDER — FREE WATER: 100.0000 mL | Freq: Three times a day (TID) | Status: AC

## 2016-11-20 MED ORDER — CIPROFLOXACIN HCL 0.3 % OP SOLN: 2.0000 [drp] | Freq: Three times a day (TID) | OPHTHALMIC | 0 refills | Status: DC

## 2016-11-20 MED ORDER — HYDRALAZINE HCL 50 MG PO TABS: 50.0000 mg | ORAL_TABLET | Freq: Three times a day (TID) | ORAL | Status: AC

## 2016-11-20 MED ORDER — PANTOPRAZOLE SODIUM 40 MG PO PACK: 40.0000 mg | PACK | Freq: Every day | ORAL | Status: AC

## 2016-11-20 MED ORDER — FUROSEMIDE 10 MG/ML IJ SOLN
40.0000 mg | Freq: Three times a day (TID) | INTRAMUSCULAR | Status: DC
  Administered 2016-11-20: 40 mg via INTRAVENOUS
  Filled 2016-11-20: qty 4

## 2016-11-20 MED ORDER — POTASSIUM CHLORIDE 20 MEQ/15ML (10%) PO SOLN
60.0000 meq | Freq: Every day | ORAL | Status: DC
  Administered 2016-11-20: 60 meq
  Filled 2016-11-20: qty 45

## 2016-11-20 MED ORDER — JEVITY 1.2 CAL PO LIQD: 1000.0000 mL | ORAL | 0 refills | Status: AC

## 2016-11-20 MED ORDER — BISACODYL 10 MG RE SUPP: 10.0000 mg | Freq: Every day | RECTAL | 0 refills | Status: AC | PRN

## 2016-11-20 MED ORDER — BACITRACIN-POLYMYXIN B 500-10000 UNIT/GM OP OINT: TOPICAL_OINTMENT | Freq: Every day | OPHTHALMIC | 0 refills | Status: AC

## 2016-11-20 NOTE — Clinical Social Work Placement (Signed)
   CLINICAL SOCIAL WORK PLACEMENT  NOTE  Date:  11/20/2016  Patient Details  Name: Sean [name] MRN: [id number] Date of Birth: 01/31/1926  Clinical Social Work is seeking post-discharge placement for this patient at the Skilled  Nursing Facility level of care (*CSW will initial, date and re-position this form in  chart as items are completed):  Yes   Patient/family provided with [facility] Social Work Department's list of facilities offering this level of care within the geographic area requested by the patient (or if unable, by the patient's family).  Yes   Patient/family informed of their freedom to choose among providers that offer the needed level of care, that participate in Medicare, Medicaid or managed care program needed by the patient, have an available bed and are willing to accept the patient.  Yes   Patient/family informed of [employer]'s ownership interest in [facility] and [facility], as well as of the fact that they are under no obligation to receive care at these facilities.  PASRR submitted to EDS on 11/18/16     PASRR number received on 11/18/16     Existing PASRR number confirmed on       FL2 transmitted to all facilities in geographic area requested by pt/family on 11/20/16     FL2 transmitted to all facilities within larger geographic area on       Patient informed that his/her managed care company has contracts with or will negotiate with certain facilities, including the following:        Yes   Patient/family informed of bed offers received.  Patient [facility] bed at [facility]     Physician recommends and patient chooses bed at      Patient to be [facility] on 11/20/16.  Patient to be transferred to facility by ptar     Patient family notified on 11/20/16 of transfer.  Name of family member notified:  [name]     PHYSICIAN Please sign FL2     Additional Comment:     _______________________________________________ Burna SisUris, Tishara Pizano H, LCSW 11/20/2016, 2:33 PM

## 2016-11-20 NOTE — Progress Notes (Signed)
Sean [name]  XYI:[id number] DOB: [birth year] DOA: 10/23/2016 PCP: No primary care provider on file.    Brief Narrative:  82 y/o M admitted 11/10 with c/o AMS, no movement on left side, unable to speak.  Found to have R ICA occlusion / MCA CVA.  To Neuro IR for thrombectomy, returned to ICU on vent. Prolonged stay on a ventilator, ended up with tracheostomy and PEG tube. Currently awaiting LTAC versus skilled nursing facility, family favors LTAC.  Significant Events: 11/10 admit w/ acute CVA - to IR for recanalization of R ICA - intubated 11/16 tracheostomy and PEG placed  11/20 [facility] assumed care   Subjective: No family at bedside, nonverbal, does not follow simple commands. No changes clinically, he is tracheostomy tube change to cuffless since 11/7. 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 LTAC also being considered.   Acute Respiratory Failure secondary to CVA Trach 11/16 - ongoing tracheostomy care per PCCM - on Trach collar withongoing greenish trach secretions. Per nursing staff the secretions have been quite significant. Patient does have coarse breath sounds with crackles bilaterally. Hasn't had any imaging studies recently. -CXR showed stable interstitial prominence suggesting low-grade CHF, new right density atelectasis versus pneumonia. -Given 1 dose of Lasix yesterday, switched to less trach. Continue deep suction and trach care.  Chronic Afib Was on PRADAXA, noncompliant - rate controlled at this time, switched to Eliquis    Essential hypertension Reasonably well controlled  Hyperlipidemia LDL 53 - f/u as outpt  Hypokalemia Corrected  Dysphagia  PEG 11/16 - continue tube feeds - abdom binder in place    Thrombocytopenia - resolved    unknown baseline, on chronic anticoagulation - platelet count has normalized   Normocytic Anemia No gross evidence of blood loss at this time - hemoglobin stable even on anticoag   Bleeding around Tracheostomy resolved  HCAP - ? Effusion Left base  Antibiotic therapy initiated by PCCM 11/17 - CXR w/o signif change 11/22 - has completed an abx course.    Hyperglycemia  A1c 6.2 - consistent with prediabetic state- stable for now   Goals of Care Palliative Care has met with the family, per notes family wants patient to go to [facility] versus SNF.   DVT prophylaxis: Eliquis Code Status: FULL CODE Family Communication: no family present at time of exam  Disposition Plan:  LTAC versus SNF placement being pursued. Repeat chest x-ray today due to increased secretions.   Consultants:  PCCM Neurology   Antimicrobials:  Ceftriaxone 11/17 > 11/22 Vanc 11/26 > 11/28  Objective: Blood pressure (!) 113/94, pulse 92, temperature 99.4 F (37.4 C), temperature source Oral, resp. rate 16, height _0  (1.702 m), weight 81.6 kg (180 lb), SpO2 100 %.  Intake/Output Summary (Last 24 hours) at 11/20/16 1130 Last data filed at 11/20/16 0500  Gross per 24 hour  Intake             1770 ml  Output             1850 ml  Net              -80 ml   Filed Weights   11/18/16 0500 11/19/16 0451 11/20/16 0409  Weight: 75.8 kg (167 lb) 75.8 kg (167 lb) 81.6 kg (180  lb)    Examination: General: Awake, alert. Appears to be distracted. Lungs: Coarse breath sounds bilaterally. Few crackles. No wheezing. Cardiovascular: S1, S2 is irregularly irregular. No S3, S4. No rubs, murmurs, or bruit Abdomen: Nondistended, soft, bowel sounds positive - PEG insertion clean/dry  Extremities: No edema bilateral lower extremities. Mitts noted on the right hand. He has some kind of a splint over his left wrist.  CBC:  Recent Labs Lab 11/14/16 0307 11/15/16 0145 11/16/16 0211 11/17/16 0523 11/18/16 0559  11/20/16 0525  WBC 6.2 5.2 6.3 6.8 6.2 5.9  NEUTROABS 4.2 3.3 4.2 4.9  --   --   HGB 9.6* 9.2* 9.5* 9.6* 9.5* 9.7*  HCT 30.3* 29.4* 29.0* 29.8* 29.5* 30.6*  MCV 87.3 88.3 87.9 87.4 86.8 88.2  PLT 368 340 257 265 262 315   Basic Metabolic Panel:  Recent Labs Lab 11/14/16 0307 11/15/16 0145 11/16/16 0211 11/17/16 0523 11/18/16 0559 11/19/16 0507 11/20/16 0525  NA 139 137 134* 134* 133* 136 137  K 4.0 3.9 4.1 4.0 4.0 4.1 3.8  CL 106 103 103 101 98* 103 100*  CO2 _0 GLUCOSE 113* 142* 131* 123* 164* 158* 119*  BUN _1 CREATININE 0.57* 0.67 0.60* 0.59* 0.67 0.64 0.61  CALCIUM 8.1* 7.9* 7.6* 8.0* 8.0* 8.0* 8.1*  MG 2.3 2.2 2.1 2.1  --   --   --   PHOS 3.7 3.7 3.4 3.8  --   --   --    GFR: Estimated Creatinine Clearance: 65.3 mL/min (by C-G formula based on SCr of 0.61 mg/dL).  Liver Function Tests:  Recent Labs Lab 11/14/16 0307 11/15/16 0145 11/16/16 0211 11/17/16 0523  ALBUMIN 2.1* 2.1* 2.3* 2.3*    HbA1C: Hgb A1c MFr Bld  Date/Time Value Ref Range Status  10/24/2016 05:00 AM 6.2 (H) 4.8 - 5.6 % Final    Comment:    (NOTE)         Pre-diabetes: 5.7 - 6.4         Diabetes: >6.4         Glycemic control for adults with diabetes: <7.0     CBG:  Recent Labs Lab 11/19/16 1614 11/19/16 2054 11/20/16 0021 11/20/16 0416 11/20/16 0800  GLUCAP 153* 115* 171* 137* 137*    Recent Results (from the past 240 hour(s))  Culture, respiratory (NON-Expectorated)     Status: None   Collection Time: 11/10/16 10:53 PM  Result Value Ref Range Status   Specimen Description TRACHEAL ASPIRATE  Final   Special Requests NONE  Final   Gram Stain   Final    FEW WBC PRESENT,BOTH PMN AND MONONUCLEAR MODERATE GRAM NEGATIVE RODS FEW GRAM POSITIVE RODS FEW GRAM POSITIVE COCCI IN PAIRS    Culture Consistent with normal respiratory flora.  Final   Report Status 11/12/2016 FINAL  Final     Scheduled Meds: . apixaban  5 mg Oral BID  .  bacitracin-polymyxin b   Right Eye QHS  . chlorhexidine  15 mL Mouth Rinse BID  . ciprofloxacin  2 drop Right Eye TID AC  . feeding supplement (PRO-STAT SUGAR FREE 64)  30 mL Per Tube TID  . free water  100 mL Per Tube Q8H  . furosemide  40 mg Intravenous Q8H  . hydrALAZINE  50 mg Per Tube Q8H  . insulin aspart  0-15 Units Subcutaneous Q4H  . pantoprazole sodium  40 mg Per Tube Daily  .  potassium chloride  60 mEq Per Tube Daily  . sennosides  5 mL Per Tube BID   Continuous Infusions: . sodium chloride 10 mL/hr at 11/19/16 0400  . feeding supplement (JEVITY 1.2 CAL) 1,000 mL (11/20/16 0827)     LOS: 28 days   [facility] Office  [phone] Pager - Text Page per Shea Evans as per below:  On-Call/Text Page:      [email or web address]      password [email or web address]  If 7PM-7AM, please contact night-coverage [email or web address] Password [email or web address] 11/20/2016, 11:30 AM

## 2016-11-20 NOTE — Progress Notes (Signed)
[facility] continues to negotiate with insurance- anticipate approval Monday.  Facility will accept 5 day LOG while awaiting approval.  Patient will discharge to [facility] Anticipated discharge date: 12/8 Family notified: [name], wife at bedside Transportation by [facility]- scheduled for 3:30pm- pt wife to ride along Report #: [phone]   CSW signing off.  Burna SisJenna H. Brynnly Bonet, LCSW Clinical Social Worker [phone]

## 2016-11-20 NOTE — Progress Notes (Signed)
Occupational Therapy Treatment Patient Details Name: Sean [name] [place] MRN: [id number] DOB: [birth year] Today's Date: 11/20/2016    History of present illness Sean [name] Magley is a 82 y.o. male admitted with He has expressive aphasia and Not moving left side with right-sided gaze deviation. MRI: Areas of acute infarction in the right MCA territory involving the insula, frontal operculum, anteromedial temporal lobe, and lateral temporal parietal junction s/p revascularization.   OT comments  Pt.s spouse present for session.  Continued education with PROM and positioning of B UES.  Attempted some ADL grooming tasks with pt., inconsistencies with following cues.  Will continue to follow acutely.    Follow Up Recommendations  SNF;Supervision/Assistance - 24 hour    Equipment Recommendations  Other (comment)    Recommendations for Other Services      Precautions / Restrictions Precautions Precautions: Fall Precaution Comments: trach, peg, condom cath Restrictions Weight Bearing Restrictions: No       Mobility Bed Mobility                  Transfers                      Balance                                   ADL Overall ADL's : Needs assistance/impaired     Grooming: Oral care;Wash/dry face;Maximal assistance;Total assistance Grooming Details (indicate cue type and reason): attempted hand over hand, pt. unable to hold washcloth and initiate movement on his face.  open/close mouth on swab without cues, some delay with opening again clenching tightly on swab stick                                      Vision                     Perception     Praxis      Cognition                             Extremity/Trunk Assessment               Exercises Other Exercises Other Exercises: Educated pts  wife on PROM for LUE, retrograde massage for edema management, and elevation. Other Exercises: educated on proper  positioning of resting hand splint   Shoulder Instructions       General Comments      Pertinent Vitals/ Pain          Home Living                                          Prior Functioning/Environment              Frequency  Min 2X/week        Progress Toward Goals  OT Goals(current goals can now be found in the care plan section)  Progress towards OT goals: Progressing toward goals     Plan Discharge plan remains appropriate    Co-evaluation                 End of Session Equipment Utilized During Treatment: Oxygen   Activity  Tolerance Patient tolerated treatment well;Patient limited by lethargy   Patient Left in bed;with call bell/phone within reach;with family/visitor present   Nurse Communication          Time: 1217-1227 OT Time Calculation (min): 10 min  Charges: OT General Charges $OT Visit: 1 Procedure OT Treatments $Self Care/Home Management : 8-22 mins  Robet LeuMorris, Denine Brotz [place], COTA/L 11/20/2016, 12:37 PM

## 2016-11-20 NOTE — Discharge Summary (Signed)
Physician Discharge Summary  Corneluis Allston LSL:[id number] DOB: 31-Aug-1926 DOA: 10/23/2016  PCP: No primary care provider on file.  Admit date: 10/23/2016 Discharge date: 11/20/2016  Admitted From: Home Disposition: [facility]  Recommendations for Outpatient Follow-up:  1. Follow up with PCP in 1-2 weeks 2. Please obtain BMP/CBC in one week  Home Health: NA Equipment/Devices:NA  Discharge Condition: Fair, prognosis is guarded and expected re-admission CODE STATUS: Full Code Diet recommendation: Diet NPO time specified, on tube feeding, Continue Jevity 1.2 at 55 ml/h with Prostat 30 ml TID  Brief/Interim Summary: 82 y/o M admitted 11/10 with c/o AMS, no movement on left side, unable to speak. Found to have R ICA occlusion / MCA CVA. To Neuro IR for thrombectomy, returned to ICU on vent. Prolonged stay on a ventilator, ended up with tracheostomy and PEG tube. Has dysphagia and respiratory failure s/p tracheostomy.  Discharge Diagnoses:  Active Problems:   Cerebral embolism with cerebral infarction   Cerebrovascular accident (CVA) due to thrombosis of precerebral artery (HCC)   Acute respiratory failure with hypoxia (HCC)   Cerebrovascular accident (CVA) due to embolism of right carotid artery ([place])   Encounter for intubation   Chronic a-fib (HCC)   Dysphagia   Acute blood loss anemia   Thrombocytopenia (HCC)   Benign essential HTN   Bradycardia   Pain   Ventilator dependence ([place])   HCAP (healthcare-associated pneumonia)   Cerebral infarction due to embolism of right carotid artery ([place])   Essential hypertension   Tracheostomy care ([place])   Pressure injury of skin   Bleeding   Cerebrovascular accident (CVA) ([place])   VAP (ventilator-associated pneumonia) ([place])   Cerebrovascular accident (CVA) due to thrombosis of cerebellar artery ([place])   Acute pulmonary edema (HCC)   Chronic atrial fibrillation ([place])   Other hyperlipidemia   Oral phase dysphagia   Prolonged QT interval    Goals of care, counseling/discussion   Palliative care by specialist   Advance care planning   Stroke [facility])   Right MCA CVA due to R ICA occlusion s/p mechanical thrombectomy  -Felt to be embolic in setting of A fib w/ Pradaxa noncompliance - anticoag w/ Eliquis presently - OT/PT suggesting SNF.  -Now on Eliquis though PEG tube.  Acute Respiratory Failure secondary to CVA -Trach 11/16 - ongoing tracheostomy care per PCCM - on Trach collar withongoing greenish trach secretions. Per nursing staff the secretions have been quite significant. Patient does have coarse breath sounds with crackles bilaterally. -CXR showed stable interstitial prominence suggesting low-grade CHF, new right density atelectasis versus pneumonia. -Diuresed with good response, no fever or WBC to suggest PNA. -Trach changed to cuffless on 11/19/16  Chronic Afib -Was on PRADAXA, noncompliant - rate controlled at this time, switched to Eliquis.   -CHA2DS2-VASc is at least 5 for Age, CVA and HTN  Essential hypertension -Reasonably well controlled, on per tube hydralazine  Hyperlipidemia LDL 53 - f/u as outpt  Hypokalemia Corrected  Dysphagia  -PEG 11/16 placed by Dr. Grandville Silos of gen surgery- continue tube feeds - abdom binder in place  -Started on lasix 20 mg daily to keep even intake / output  Thrombocytopenia - resolved  unknown baseline, on chronic anticoagulation - platelet count has normalized   Normocytic Anemia No gross evidence of blood loss at this time - hemoglobin stable even on anticoag   Bleeding around Tracheostomy -Resolved, not uncommon for fresh trach  HCAP - ? Effusion Left base  Antibiotic therapy initiated by PCCM 11/17 - CXR w/o  signif change 11/22 - has completed an abx course.    Hyperglycemia  A1c 6.2 - consistent with prediabetic state- stable for now   Goals of Care Palliative Care has met with the family, decided for full scope of treatment, per notes family  wants patient to go to SNF.   Discharge Instructions  Discharge Instructions    Ambulatory referral to Neurology    Complete by:  As directed    Pt will follow up with Dr. Erlinda Hong at [facility] in about 2 months. Thanks.   Diet - low sodium heart healthy    Complete by:  As directed    Increase activity slowly    Complete by:  As directed        Medication List    STOP taking these medications   dabigatran 150 MG Caps capsule Commonly known as:  PRADAXA   ibuprofen 200 MG tablet Commonly known as:  ADVIL,MOTRIN   lisinopril-hydrochlorothiazide 20-25 MG tablet Commonly known as:  PRINZIDE,ZESTORETIC   niacin 500 MG tablet     TAKE these medications   apixaban 5 MG Tabs tablet Commonly known as:  ELIQUIS Place 1 tablet (5 mg total) into feeding tube 2 (two) times daily.   bacitracin-polymyxin b ophthalmic ointment Commonly known as:  POLYSPORIN Place into the right eye at bedtime. apply to eye every 12 hours while awake   bisacodyl 10 MG suppository Commonly known as:  DULCOLAX Place 1 suppository (10 mg total) rectally daily as needed for moderate constipation.   ciprofloxacin 0.3 % ophthalmic solution Commonly known as:  CILOXAN Place 2 drops into the right eye 3 (three) times daily before meals. Administer 1 drop, every 2 hours, while awake, for 2 days. Then 1 drop, every 4 hours, while awake, for the next 5 days.   feeding supplement (JEVITY 1.2 CAL) Liqd Place 1,000 mLs into feeding tube continuous.   feeding supplement (PRO-STAT SUGAR FREE 64) Liqd Place 30 mLs into feeding tube 3 (three) times daily.   free water Soln Place 100 mLs into feeding tube every 8 (eight) hours.   furosemide 20 MG tablet Commonly known as:  LASIX Take 1 tablet (20 mg total) by mouth daily.   hydrALAZINE 50 MG tablet Commonly known as:  APRESOLINE Place 1 tablet (50 mg total) into feeding tube every 8 (eight) hours.   pantoprazole sodium 40 mg/20 mL Pack Commonly known as:   PROTONIX Place 20 mLs (40 mg total) into feeding tube daily. Start taking on:  11/21/2016      Follow-up Information    Xu,Jindong, MD. Schedule an appointment as soon as possible for a visit in 6 week(s).   Specialty:  Neurology Contact information: [address] [place] [id number] [phone]          No Known Allergies  Consultations:  Neuro  PCCM  Palliative care.  Gen Surg.   Procedures (Echo, Carotid, EGD, Colonoscopy, ERCP)   Radiological studies: Ct Angio Head W Or Wo Contrast  Result Date: 10/23/2016 CLINICAL DATA:  Code stroke.  Left -sided weakness. EXAM: CT ANGIOGRAPHY HEAD AND NECK TECHNIQUE: Multidetector CT imaging of the head and neck was performed using the standard protocol during bolus administration of intravenous contrast. Multiplanar CT image reconstructions and MIPs were obtained to evaluate the vascular anatomy. Carotid stenosis measurements (when applicable) are obtained utilizing NASCET criteria, using the distal internal carotid diameter as the denominator. CONTRAST:  50 mL Isovue 370 COMPARISON:  None. FINDINGS: CTA NECK FINDINGS Aortic  arch: There is atherosclerotic calcification within the aortic arch. The proximal subclavian arteries are normal. No aneurysm or dissection of the aorta. Right carotid system: The right carotid origin is normal. There is mild atherosclerotic plaque within the right common carotid artery. Within the distal right common carotid artery. There are multiple areas of somewhat reduced attenuation. There is central thrombus within the proximal right ICA at the bifurcation with complete occlusion of the right ICA approximately 1.3 cm above the bifurcation. The remainder of the cervical right ICA is occluded. Left carotid system: There is mild multifocal atherosclerotic calcification of the left common carotid artery. No hemodynamically significant stenosis of the left ICA. Vertebral arteries: There is  atherosclerotic calcification within the V4 segments of both vertebral arteries with less than 50% stenosis. The vertebral artery origins are normal. The course and caliber of the cervical segments of the vertebral arteries are normal. Skeleton: There is no bony spinal canal stenosis. No lytic or blastic lesions. Other neck: The nasopharynx is clear. The oropharynx and hypopharynx are normal. The epiglottis is normal. The supraglottic larynx, glottis and subglottic larynx are normal. No retropharyngeal collection. The parapharyngeal spaces are preserved. The parotid and submandibular glands are normal. No sialolithiasis or salivary ductal dilatation. The thyroid gland is normal. There is no cervical lymphadenopathy. Upper chest: No pulmonary nodules or masses. No pleural effusion or focal consolidation. Review of the MIP images confirms the above findings CTA HEAD FINDINGS There is motion degradation of the intracranial images. Anterior circulation: --Intracranial internal carotid arteries: The right ICA is occluded at the bifurcation and there is no opacification of the intracranial right ICA. There is atherosclerotic calcification of the left ICA at the skullbase. --Anterior cerebral arteries: Normal. --Middle cerebral arteries: There is severely diminished opacification of the right MCA. Lead opacification there is is probably secondary to collateral flow across the anterior communicating artery. --Posterior communicating arteries: Present on the left. Not seen on the right. Posterior circulation: --Posterior cerebral arteries: There is a fetal origin of the left PCA. Right PCA is normal. --Superior cerebellar arteries: Normal. --Basilar artery: Normal. --Anterior inferior cerebellar arteries: Normal. --Posterior inferior cerebellar arteries: Normal. Venous sinuses: As permitted by contrast timing, patent. Anatomic variants: Fetal origin of the left PCA. Delayed phase: Not performed Review of the MIP images  confirms the above findings IMPRESSION: 1. Occlusion of the right internal carotid artery just distal to the carotid bifurcation 2. Severely diminished opacification of the right middle cerebral artery, in keeping with acute right MCA infarct and the reported clinical left-sided symptoms. 3. No hemodynamically significant stenosis of the left carotid or vertebral arteries. Critical Value/emergent results were called by telephone at the time of interpretation on 10/23/2016 at 4:57 am to Dr. Kerney Elbe, who verbally acknowledged these results. Electronically Signed   By: Ulyses Jarred M.D.   On: 10/23/2016 05:07   Ct Head Wo Contrast  Result Date: 10/25/2016 CLINICAL DATA:  Acute onset of left hemiplegia and aphasia. Re- vascularized right ICA. EXAM: CT HEAD WITHOUT CONTRAST TECHNIQUE: Contiguous axial images were obtained from the base of the skull through the vertex without intravenous contrast. COMPARISON:  CT 10/23/2016.  MRI 10/23/2016. FINDINGS: Brain: Progressive low-density in the right insular and frontal region and in the right temporal and parietal region consistent with completed infarction in those areas. Mild swelling but no evidence of hemorrhage or shift. Old infarctions affecting the cerebellum in cerebral hemispheric white matter appear the same. No sign of new acute infarction. No hydrocephalus or  extra-axial collection. Vascular: There is atherosclerotic calcification of the major vessels at the base of the brain. Skull: Negative Sinuses/Orbits: Mucosal inflammatory changes of the maxillary and ethmoid sinuses. Other: None significant IMPRESSION: Progressive low-density has developed in the right insular and frontal region and in the right temporal and parietal region consistent with completed infarction in those regions. Mild swelling but no hemorrhage or shift. Electronically Signed   By: Nelson Chimes M.D.   On: 10/25/2016 11:20   Ct Head Wo Contrast  Result Date: 10/23/2016 CLINICAL  DATA:  Stroke. Post revascularization of occluded right internal carotid artery. EXAM: CT HEAD WITHOUT CONTRAST TECHNIQUE: Contiguous axial images were obtained from the base of the skull through the vertex without intravenous contrast. COMPARISON:  CT head 10/23/2016 FINDINGS: Brain: Advanced atrophy. Chronic microvascular ischemic change in the white matter. No new area of acute infarct since the preprocedure CT Ill-defined hyperdensity in the right hippocampus and posterior limb internal capsule. Similar area of ill-defined hyperdensity in the right superior cerebellum. These may be areas of hemorrhage or contrast staining related to angiography and/or infarction. CT follow-up recommended. No shift of the midline structures. Ventricular enlargement consistent with atrophy is unchanged. Vascular: Extensive arterial calcification. Contrast filled artery is from prior angiography. Skull: Negative Sinuses/Orbits: Mucosal edema throughout the paranasal sinuses Other: None IMPRESSION: Advanced atrophy with chronic microvascular ischemia. No new area of acute infarct identified Mild patchy areas of increased density in the right cerebellum, right hippocampus, and posterior limb internal capsule the right. These may be areas of contrast staining from angiography in areas of infarction. Hemorrhage not excluded. If hemorrhage, these areas are mild without mass-effect. Continued CT follow-up recommended. Electronically Signed   By: Franchot Gallo M.D.   On: 10/23/2016 12:35   Ct Angio Neck W And/or Wo Contrast  Result Date: 10/23/2016 CLINICAL DATA:  Code stroke.  Left -sided weakness. EXAM: CT ANGIOGRAPHY HEAD AND NECK TECHNIQUE: Multidetector CT imaging of the head and neck was performed using the standard protocol during bolus administration of intravenous contrast. Multiplanar CT image reconstructions and MIPs were obtained to evaluate the vascular anatomy. Carotid stenosis measurements (when applicable) are  obtained utilizing NASCET criteria, using the distal internal carotid diameter as the denominator. CONTRAST:  50 mL Isovue 370 COMPARISON:  None. FINDINGS: CTA NECK FINDINGS Aortic arch: There is atherosclerotic calcification within the aortic arch. The proximal subclavian arteries are normal. No aneurysm or dissection of the aorta. Right carotid system: The right carotid origin is normal. There is mild atherosclerotic plaque within the right common carotid artery. Within the distal right common carotid artery. There are multiple areas of somewhat reduced attenuation. There is central thrombus within the proximal right ICA at the bifurcation with complete occlusion of the right ICA approximately 1.3 cm above the bifurcation. The remainder of the cervical right ICA is occluded. Left carotid system: There is mild multifocal atherosclerotic calcification of the left common carotid artery. No hemodynamically significant stenosis of the left ICA. Vertebral arteries: There is atherosclerotic calcification within the V4 segments of both vertebral arteries with less than 50% stenosis. The vertebral artery origins are normal. The course and caliber of the cervical segments of the vertebral arteries are normal. Skeleton: There is no bony spinal canal stenosis. No lytic or blastic lesions. Other neck: The nasopharynx is clear. The oropharynx and hypopharynx are normal. The epiglottis is normal. The supraglottic larynx, glottis and subglottic larynx are normal. No retropharyngeal collection. The parapharyngeal spaces are preserved. The parotid and submandibular  glands are normal. No sialolithiasis or salivary ductal dilatation. The thyroid gland is normal. There is no cervical lymphadenopathy. Upper chest: No pulmonary nodules or masses. No pleural effusion or focal consolidation. Review of the MIP images confirms the above findings CTA HEAD FINDINGS There is motion degradation of the intracranial images. Anterior circulation:  --Intracranial internal carotid arteries: The right ICA is occluded at the bifurcation and there is no opacification of the intracranial right ICA. There is atherosclerotic calcification of the left ICA at the skullbase. --Anterior cerebral arteries: Normal. --Middle cerebral arteries: There is severely diminished opacification of the right MCA. Lead opacification there is is probably secondary to collateral flow across the anterior communicating artery. --Posterior communicating arteries: Present on the left. Not seen on the right. Posterior circulation: --Posterior cerebral arteries: There is a fetal origin of the left PCA. Right PCA is normal. --Superior cerebellar arteries: Normal. --Basilar artery: Normal. --Anterior inferior cerebellar arteries: Normal. --Posterior inferior cerebellar arteries: Normal. Venous sinuses: As permitted by contrast timing, patent. Anatomic variants: Fetal origin of the left PCA. Delayed phase: Not performed Review of the MIP images confirms the above findings IMPRESSION: 1. Occlusion of the right internal carotid artery just distal to the carotid bifurcation 2. Severely diminished opacification of the right middle cerebral artery, in keeping with acute right MCA infarct and the reported clinical left-sided symptoms. 3. No hemodynamically significant stenosis of the left carotid or vertebral arteries. Critical Value/emergent results were called by telephone at the time of interpretation on 10/23/2016 at 4:57 am to Dr. Kerney Elbe, who verbally acknowledged these results. Electronically Signed   By: Ulyses Jarred M.D.   On: 10/23/2016 05:07   Mr Brain Wo Contrast  Result Date: 10/23/2016 CLINICAL DATA:  82 y/o M; post revascularization of the occluded right internal carotid artery. EXAM: MRI HEAD WITHOUT CONTRAST TECHNIQUE: Multiplanar, multiecho pulse sequences of the brain and surrounding structures were obtained without intravenous contrast. COMPARISON:  None. FINDINGS:  Brain: There are areas of diffusion restriction in the right MCA territory involving the right insula, frontal operculum, lateral temporal parietal junction, and anteromedial temporal lobe with few additional scattered punctate foci compatible with acute infarct. No evidence for hemorrhagic conversion. There is additional punctate cortical focus of diffusion restriction within the left occipital lobe consistent with infarction. Foci of mixed diffusion signal with susceptibility hypointensity within the right cerebellar hemisphere have corresponding low density on CT and are likely represents sequelae of chronic hemorrhagic infarction. There are multiple additional scattered foci of susceptibility hypointensity with a predominantly central distribution likely representing hypertensive etiology. Background of advanced chronic microvascular ischemic changes, parenchymal volume loss, and small lacunar infarcts within the pons and thalamus. Vascular: Normal flow voids. Skull and upper cervical spine: Normal marrow signal. Sinuses/Orbits: Mastoid and paranasal sinus opacification and fluid within the nasopharynx is likely due to intubation. Visualized orbits are unremarkable with bilateral intra-ocular lens replacement. Minimal nonspecific optic nerve dural ectasia. Other: None. IMPRESSION: 1. Areas of acute infarction in the right MCA territory involving the insula, frontal operculum, anteromedial temporal lobe, and lateral temporal parietal junction. No evidence for hemorrhagic conversion. 2. Sequelae of small chronic hemorrhagic infarcts in the right cerebellum and central distribution chronic microhemorrhage likely hypertensive in etiology. 3. Advanced chronic microvascular ischemic changes and parenchymal volume loss. Electronically Signed   By: Kristine Garbe M.D.   On: 10/23/2016 23:04   Ct Cerebral Perfusion W Contrast  Result Date: 10/23/2016 CLINICAL DATA:  Stroke.  Left-sided symptoms. EXAM: CT  CEREBRAL PERFUSION  WITH CONTRAST TECHNIQUE: Axial CT images of the head were acquired following the administration of IV contrast. Using a separate software package, perfusion data was created. CONTRAST:  50 mL Isovue 370 IV COMPARISON:  CTA head and neck 10/23/2016 FINDINGS: The examination is compromised by patient motion. Perfusion data maps show a increased Tmax in the right MCA distribution, which fits with the clinical picture and the findings of the CTA. However, there are also elevated Tmax values in the left hemisphere and posterior fossa, which are likely artifactual. The calculated mismatch volume of 193 mL is likely unreliable. IMPRESSION: Motion during the perfusion scan compromises the obtained data. The calculated mismatch volume of 193 mL is likely unreliable and is not expected to provide accurate assessment of the ischemic penumbra. Electronically Signed   By: Ulyses Jarred M.D.   On: 10/23/2016 06:11   Dg Chest Port 1 View  Result Date: 11/19/2016 CLINICAL DATA:  Assess tracheostomy.  Pulled out. EXAM: PORTABLE CHEST 1 VIEW COMPARISON:  11/17/2016 FINDINGS: Tracheostomy appliance is centered on the tracheal airway. Mild patchy airspace opacity in the right medial base. No large effusions. IMPRESSION: Tracheostomy appliance appears satisfactorily positioned on this single view. Unchanged patchy right medial base opacity. Electronically Signed   By: Andreas Newport M.D.   On: 11/19/2016 03:47   Dg Chest Port 1 View  Result Date: 11/17/2016 CLINICAL DATA:  Tracheostomy patient. , acute respiratory failure with hypoxia, previous CVA, atrial fibrillation, previous episodes of pneumonia EXAM: PORTABLE CHEST 1 VIEW COMPARISON:  Chest x-ray of November 11, 2016 FINDINGS: The lungs are reasonably well inflated. The interstitial markings are increased. There is increased density in the right infrahilar region today. The cardiac silhouette is enlarged. The central pulmonary vascularity is  prominent. There is calcification in the wall of the aortic arch. There is no pleural effusion. The tracheostomy tube tip projects 1 cm below the inferior margin of the clavicular heads which is 6.2 cm above the carina. IMPRESSION: Mild stable interstitial prominence suggests low-grade CHF given the pulmonary vascular congestion and mild cardiomegaly. New right infrahilar density consistent with atelectasis or pneumonia. The tracheostomy appliance is in reasonable position. Thoracic aortic atherosclerosis. Electronically Signed   By: David  Martinique M.D.   On: 11/17/2016 11:21   Dg Chest Port 1 View  Result Date: 11/11/2016 CLINICAL DATA:  Pneumonia. EXAM: PORTABLE CHEST 1 VIEW COMPARISON:  Radiograph of November 06, 2016. FINDINGS: Stable cardiomegaly. Tracheostomy tube is unchanged in position. No pneumothorax is noted. Mild bibasilar subsegmental atelectasis or infiltrates are noted. Bony thorax is unremarkable. IMPRESSION: Increase bibasilar opacities are noted concerning for worsening atelectasis or possibly infiltrates. Electronically Signed   By: Marijo Conception, M.D.   On: 11/11/2016 07:38   Dg Chest Port 1 View  Result Date: 11/06/2016 CLINICAL DATA:  Bleeding around the tracheostomy site. History of hypertension. EXAM: PORTABLE CHEST 1 VIEW COMPARISON:  11/04/2016 FINDINGS: Mild cardiac enlargement. No pulmonary vascular congestion or edema. No blunting of costophrenic angles. No pneumothorax. Calcification of the aorta. Mediastinal contours appear intact. Tracheostomy with tip measuring 8 cm above the carina. IMPRESSION: Tracheostomy appears in place. Mild cardiac enlargement. No evidence of active pulmonary disease. Electronically Signed   By: Lucienne Capers M.D.   On: 11/06/2016 05:44   Dg Chest Port 1 View  Result Date: 11/04/2016 CLINICAL DATA:  Tracheostomy EXAM: PORTABLE CHEST 1 VIEW COMPARISON:  11/02/2016 FINDINGS: Cardiomediastinal silhouette is stable. Tracheostomy tube is  unchanged in position. Central mild vascular congestion. Residual mild  interstitial prominence bilateral lower lobes probable improving edema. No segmental infiltrate. IMPRESSION: Tracheostomy tube is unchanged in position. Central mild vascular congestion. Residual mild interstitial prominence bilateral lower lobes probable improving edema. No segmental infiltrate. Electronically Signed   By: Lahoma Crocker M.D.   On: 11/04/2016 08:35   Dg Chest Port 1 View  Result Date: 11/02/2016 CLINICAL DATA:  Pneumonia. EXAM: PORTABLE CHEST 1 VIEW FINDINGS: Tracheostomy tube noted good anatomic position. Cardiomegaly. Bilateral pulmonary infiltrates noted suggesting a component of congestive heart failure. Superimposed pneumonia cannot be excluded. Small pleural effusions cannot be excluded. IMPRESSION: 1. Tracheostomy tube in stable position. 2. Cardiomegaly with diffuse bilateral pulmonary interstitial prominence, increased from prior exam. Findings suggest congestive heart failure. Superimposed pneumonia cannot be excluded . Small bilateral pleural effusions cannot be excluded . Electronically Signed   By: Marcello Moores  Register   On: 11/02/2016 08:03   Dg Chest Port 1 View  Result Date: 11/01/2016 CLINICAL DATA:  Respiratory failure EXAM: PORTABLE CHEST 1 VIEW COMPARISON:  1 day prior FINDINGS: Tracheostomy appropriately positioned. Cardiomegaly accentuated by AP portable technique. Superior mediastinal soft tissue fullness is new and favored to be due to AP portable technique. No pleural effusion or pneumothorax. Shifting airspace opacities. Improved right infrahilar aeration with developing left perihilar and persistent left lower lobe airspace disease. IMPRESSION: Cardiomegaly with shifting airspace opacities, favoring alveolar edema. Multifocal infection could look similar. Electronically Signed   By: Abigail Miyamoto M.D.   On: 11/01/2016 07:13   Dg Chest Port 1 View  Result Date: 10/31/2016 CLINICAL DATA:   Respiratory failure EXAM: PORTABLE CHEST 1 VIEW COMPARISON:  Two days ago FINDINGS: Both lower lobes were likely collapsed previously, improved currently although there is still hazy opacity. No generalized edema. No effusion or pneumothorax. Chronic cardiopericardial enlargement. Tracheostomy tube is well seated. Artifact from EKG leads over the chest. IMPRESSION: Interval re- inflation of the lower lobes. Hazy opacity over the bases could be residual atelectasis or infection. Electronically Signed   By: Monte Fantasia M.D.   On: 10/31/2016 07:42   Dg Chest Port 1 View  Result Date: 10/29/2016 CLINICAL DATA:  82 y/o  M; tracheostomy and PEG tube today. EXAM: PORTABLE CHEST 1 VIEW COMPARISON:  10/28/2016 chest radiograph FINDINGS: Stable enlarged cardiac silhouette given projection and technique. Elevated right hemidiaphragm from prior study. Tracheostomy tube in situ. Mild haziness of the left lung may represent underlying effusion. No focal consolidation. No acute osseous abnormality is evident. IMPRESSION: Interval tracheostomy placement. New elevation of right hemidiaphragm. Haziness of left lung may represent an underlying effusion. Electronically Signed   By: Kristine Garbe M.D.   On: 10/29/2016 15:08   Dg Chest Port 1 View  Result Date: 10/28/2016 CLINICAL DATA:  Acute respiratory failure with hypoxia.  Ventilator. EXAM: PORTABLE CHEST 1 VIEW COMPARISON:  10/27/2016 and 10/25/2016 and 10/23/2016 and 01/29/2006 FINDINGS: Endotracheal tube and NG tube appear in good position. Minimal effusion at the right base, more apparent than on the prior study. Slight atelectasis at the left base, unchanged. Pulmonary vascularity is normal. Heart size is stable. IMPRESSION: Small residual right effusion. Persistent slight atelectasis at the left base. No significant Electronically Signed   By: Lorriane Shire M.D.   On: 10/28/2016 08:35   Dg Chest Port 1 View  Result Date: 10/27/2016 CLINICAL DATA:   82 year old male status post emergent right ICA occlusion and neuro-intervention for reperfusion. Initial encounter. EXAM: PORTABLE CHEST 1 VIEW COMPARISON:  Portable chest 10/25/2016 and earlier. FINDINGS: Portable AP semi  upright view at 0551 hours. Endotracheal tube tip in good position between the level the clavicles and carina. Enteric tube courses to the left upper quadrant, tip not included. Interval improved left lung volume and left lung base ventilation with improved visualization of the left hemidiaphragm. Stable cardiac size and mediastinal contours. Pulmonary vascular congestion without overt edema. No pneumothorax or pleural effusion identified. IMPRESSION: 1.  Stable lines and tubes. 2. Interval improved left lung base ventilation with mild residual atelectasis or consolidation. 3. Pulmonary vascular congestion without overt edema. Electronically Signed   By: Genevie Ann M.D.   On: 10/27/2016 07:34   Dg Chest Port 1 View  Result Date: 10/25/2016 CLINICAL DATA:  Acute respiratory failure. EXAM: PORTABLE CHEST 1 VIEW COMPARISON:  October 23, 2016 FINDINGS: The ETT is in good position. The NG tube terminates below today's film. No pneumothorax. Probable atelectasis in the left base obscuring the left hemidiaphragm. Increased interstitial markings on the right, more focal in the right base. No other interval changes. IMPRESSION: 1. Stable support apparatus. 2. Increased interstitial markings on the right versus the left, more focal in the medial right base. This could represent asymmetric edema. Recommend attention on follow-up. Electronically Signed   By: Dorise Bullion III M.D   On: 10/25/2016 07:17   Portable Chest Xray  Result Date: 10/23/2016 CLINICAL DATA:  Hypoxia EXAM: PORTABLE CHEST 1 VIEW COMPARISON:  January 29, 2006 FINDINGS: Endotracheal tube tip is 5.2 cm above the carina. Nasogastric tube tip and side port are below the diaphragm. There is no evident pneumothorax. There is no  edema or consolidation. Heart is upper normal in size with pulmonary vascular within normal limits. No adenopathy. There is atherosclerotic calcification in the aorta. IMPRESSION: Tube positions as described without pneumothorax. No edema or consolidation. Stable cardiac silhouette. There is aortic atherosclerosis. Electronically Signed   By: Lowella Grip III M.D.   On: 10/23/2016 15:22   Ir Percutaneous Art Thrombectomy/infusion Intracranial Inc Diag Angio  Result Date: 10/27/2016 CLINICAL DATA:  Left-sided weakness. Right disease gaze deviation. Abnormal CT angiogram of the head and neck. EXAM: IR PERCUTANEOUS ART THORMBECTOMY/INFUSION INTRACRANIAL INCLUDE DIAG ANGIO. BILATERAL COMMON CAROTID ARTERIOGRAMS, RIGHT VERTEBRAL ARTERY ANGIOGRAM FOLLOWED BY ENDOVASCULAR COMPLETE REVASCULARIZATION OF OCCLUDED RIGHT INTERNAL CAROTID ARTERY INTRACRANIALLY AND THE RIGHT MIDDLE CEREBRAL ARTERY AND THE RIGHT ANTERIOR CEREBRAL ARTERY USING MECHANICAL THROMBECTOMY, COMBINATION OF MECHANICAL THROMBECTOMY AND PENUMBRA ASPIRATION, AND 7.2 MG OF SUPER SELECTIVE INTRACRANIAL INTRA-ARTERIAL INTEGRILIN PROCEDURE: Following a full explanation of the procedure along with the potential associated complications, an informed witnessed consent was obtained. Risks of intracranial hemorrhage of 10%, worsening neurological deficit, ventilator dependency and death were all reviewed in detail with the patient's family. Informed consent was obtained from the patient's son. The patient was then put under general anesthesia by the Department of Anesthesiology at [facility] The right groin was prepped and draped in the usual sterile fashion. Thereafter using modified Seldinger technique, transfemoral access into the right common femoral artery was obtained without difficulty. Over a 0.035 inch guidewire a 5 French Pinnacle sheath was inserted. Through this, and also over a 0.035 inch guidewire a 5 [place] JB 1 catheter was advanced  to the aortic arch region and selectively positioned in the right common carotid artery, the right subclavian artery and the left common carotid artery. There were no acute complications. The patient tolerated the procedure well. Contrast: Isovue 300 approximately 120 mL. Anesthesia/Sedation:  General anesthesia. Medications: As per general anesthesia. FINDINGS: The right subclavian  arteriogram demonstrates a moderate 50% stenosis at the origin of the right vertebral artery. The vessel is otherwise seen to opacify to the cranial skull base. Patency is seen of the right vertebrobasilar junction and the right posterior inferior cerebellar artery. The opacified portions of the basilar artery, the right posterior cerebral artery, the superior cerebellar arteries and the right anterior-inferior cerebellar artery is normal into the delayed arterial and capillary phases. Non-opacified blood is seen in the basilar artery from the contralateral vertebral artery. The left common carotid arteriogram demonstrates the distal left cervical ICA to be widely patent. The petrous, the cavernous and supraclinoid segments are widely patent. A dominant left posterior communicating artery is seen opacifying the left posterior cerebral artery distribution. The left middle cerebral artery and the left anterior cerebral artery are seen to opacify into the capillary and venous phases. Prompt opacification via the anterior communicating artery of the right anterior cerebral artery A1 segment and A2 segment is noted. Delayed arterial phase demonstrates retrograde opacification of the right peri-insular area opacification from the right pericallosal and the callosal marginal branches. The right common carotid arteriogram demonstrates opacification of the right external carotid artery and its major branches to be widely patent. The right internal carotid artery demonstrates complete occlusion of the right internal carotid artery in the petrous  segment. There is no intracranial reconstitution from the external carotid artery branches. ENDOVASCULAR TREATMENT OF OCCLUDED RIGHT INTERNAL CAROTID ARTERY AT THE CRANIAL SKULL BASE, THE RIGHT MIDDLE CEREBRAL ARTERY AND RIGHT ANTERIOR CEREBRAL ARTERY USING MECHANICAL THROMBECTOMY X 4 PASSES, AND 1 PASS WITH A COMBINATION OF THE SOLITAIRE FR 4 MM X 40 MM DEVICE, AND MECHANICAL ASPIRATION USING AN ACE 64 ASPIRATION CATHETER, AND 7.2 MG OF SUPER SELECTIVE INTRACRANIAL INTRA-ARTERIAL INTEGRILIN ACHIEVING A TICI 2B REPERFUSION The diagnostic JB 1 catheter in the right common carotid artery was exchanged over a 0.035 inch 300 cm Rosen exchange guidewire for an 8 French 80 cm Arrow neurovascular sheath using biplane roadmap technique and constant fluoroscopic guidance. Good aspiration was obtained from the side port of the hub of the Arrow neurovascular sheath. A gentle contrast injection demonstrated no evidence of spasms, dissections or of intraluminal filling defects. This was then connected to continuous heparinized saline infusion. Over the [facility] exchange guidewire, a 95 cm FlowGate balloon guide catheter which had been prepped with 50% contrast and 50% heparinized saline infusion was then advanced and positioned in the right common carotid artery proximal to the carotid bifurcation. The guidewire was removed. Good aspiration obtained from the hub of the 8 [place] FlowGate guide catheter. A gentle contrast injection demonstrated no evidence of spasms, dissections or of intraluminal filling defects. Over a 0.035 inch Roadrunner guidewire, using biplane roadmap technique and constant fluoroscopic guidance, the 8 [place] FlowGate guide catheter was then advanced to the mid cervical right ICA. The guidewire was removed. Again good aspiration obtained from the hub of the [facility] guide catheter. However, a gentle control arteriogram again demonstrated complete occlusion of the right internal carotid artery at the level of  the proximal cavernous segment. Also demonstrated at this time was opacification of the carotid cavernous sinus on the right-side and then the left-side with subsequent opacification of the inferior petrosal sinuses. 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 At this time, in a coaxial manner and with constant heparinized saline infusion a Trevo ProVue micro catheter was advanced over a 0.014 inch Softip Synchro micro guidewire to the proximal cavernous segment. The guidewire was removed. Good aspiration obtained  from the hub of the micro catheter. At this time, approximately 3.6 mg of super selective intracranial intra-arterial Integrelin was injected over a period of approximately a minute. A gentle contrast injection through the micro catheter demonstrated large filling defect in the cavernous segment with opacification just distal to the ophthalmic artery and then complete occlusion. Over a 0.014 inch Softip Synchro micro guidewire, the micro catheter was then gently advanced into the supraclinoid right ICA. The micro guidewire had a J-tip configuration to avoid dissections or inducing spasms. Using a torque device, access was obtained into the right middle cerebral artery followed by the micro catheter which was then advanced to the distal M2 M3 region of the inferior division of the right middle cerebral artery followed by the micro catheter. The micro guidewire was removed. Poor aspiration was obtained at the hub of the micro catheter. A very gentle contrast injection demonstrated slow opacification just distal to the micro catheter with a small filling defect. This necessitated the use of another 1.8 mg of super selective intracranial intra-arterial Integrilin. Additionally 1.8 mg of intra-arterial Integrilin was infused through the 8 [place] FlowGate guide catheter in the right internal carotid artery. Thereafter, a 4 mm x 40 mm Solitaire FR retrieval device which had been prepped and purged with  50% contrast and 50% heparinized saline infusion was then advanced to the distal end of the micro catheter. The entire system was then straightened. After positioning the unopened retrieval device and the tip of the micro catheter appropriately, to cover the occluded segment of the right middle cerebral artery, the O-rings on the delivery micro catheter and the micro guidewire were loosened. With slight forward traction with the right hand on the delivery micro guidewire, with the left hand the delivery micro catheter was then retrieved unsheathing the device. This was left opened for approximately 1 minute. A control arteriogram performed through the 8 [place] FlowGate guide catheter demonstrated no opacification beyond the distal cavernous segment. The balloon was then inflated in the right ICA of the [facility] guide catheter for proximal flow arrest. Thereafter using a 60 mL syringe constant aspiration was then applied as the combination of the retrieval device with the micro catheter was then retrieved and removed. Chunks of clot were seen on the retrieval device, and also in the [place]. This was continued as the balloon was then deflated. Because of the occlusion of the guide catheter, the entire system had to be removed. The [facility] guide catheter contained a few chunks of clot as well. A diagnostic catheter was then reintroduced into right common carotid artery. A control arteriogram performed demonstrated improved flow in the supraclinoid right ICA but essentially occluded right middle cerebral artery and the right anterior cerebral artery. At this time, a second pass was made through the 8 French 95 cm FlowGate guide catheter which had been introduced into the right internal carotid artery mid cervical right ICA as mentioned above. Again after having covered the area of the large filling defect, this was then deployed as described earlier. With proximal flow arrest in the right internal carotid artery,  the combination of the retrieval device, and micro catheter were then gently retrieved and removed as mentioned early as aspiration was continued. Again a few chunks of clot were seen on the retrieval device. The aspirate did not have any clots. Aspiration was continued as the balloon was then deflated. A control arteriogram performed through the 8 [place] FlowGate guide catheter now demonstrated a large filling defect sitting  in the supraclinoid right ICA, with poor filling of the anterior cerebral artery, and large clot sitting in the cavernous segment. This prompted a third pass with the 4 mm x 40 mm Solitaire FR retrieval device as mentioned above. At this time following the retrieval of the retrieval device with the micro catheter revealed more clot in the Tuohy [place] and also on the retrieval device. After having deflated the balloon in the right internal carotid artery, control arteriogram performed demonstrated now opacification of the right internal carotid artery the petrous, the cavernous and supraclinoid segment but also demonstrated pitiful flow into the right middle and the proximal right anterior cerebral artery. Opacification of the anterior choroidal artery was now noted. At this time, a combination of the Trevo ProVue micro catheter inside of an Ace 64 Penumbra guide catheter was introduced over a 0.014 inch Softip Synchro micro guidewire through the [facility] guide catheter without difficulty. This combination was then advanced without difficulty to the supraclinoid right ICA. The micro guidewire was then gently introduced into the right middle cerebral artery into the M2 M3 region of the inferior division followed by the micro catheter. The guidewire was removed. Again a gentle contrast injection demonstrated filling defects which prompted the use of another 1.8 mg of super selective intracranial intra-arterial Integrilin. A control arteriogram performed after deployment of the device demonstrated  opacification into the now distal right MCA and also the bifurcation branches with opacification of the superior division. Prominent filling defects continued to be present in the inferior division and also in the proximal right middle cerebral artery and supraclinoid right ICA. At this time, the Ace 64 catheter was advanced into the supraclinoid right ICA just adjacent to the large filling defect. Thereafter proximal flow arrest was performed by inflating the balloon in the right internal carotid artery. The proximal portion of the 6 mm x 30 mm retrieval device was then captured into the 3 Max Penumbra micro catheter and vacuum aspiration was applied at the hub of the Ace 64 guide catheter in the supraclinoid right ICA just into the filling defect. This was continued as the combination of the retrieval device and the micro catheter were gently retrieved and removed whilst aspiration was continued in the Ace 64 guide catheter hub, and also using a 60 mL syringe at the hub of the 8 [place] FlowGate guide catheter. These were continued as the combination of the retrieval device and the micro catheter were retrieved and removed. This contained chunks of clot in the Tuohy [place] and also on the retrieval device. Aspiration was continued in the 8 [place] FlowGate guide catheter as the Ace guide catheter was retrieved and removed as there was blockage of this catheter. After removal of this, the aspiration was continued in the 8 [place] FlowGate guide guide catheter until there was free flow. The Ace aspiration catheter contained chunks of clot. Also the aspirate did contain some clot. After having established free flow in the 8 [place] FlowGate guide catheter in the right internal carotid artery, a control arteriogram performed through the [facility] guide catheter in the right internal carotid artery demonstrated complete angiographic revascularization of the internal carotid artery on the right, the right anterior cerebral  artery, and the right middle cerebral artery. There was free flow noted in the superior division. However, filling defect was noted in the proximal inferior division though slow flow was noted distal to this. This prompted the advancement of a Trevo ProVue micro catheter and Softip Synchro micro guidewire  inside of a 125 cm [place] guide catheter to the supraclinoid right ICA. The micro guidewire was then gently manipulated into the right middle cerebral artery and followed by the micro catheter. Free easy access was obtained into the nearly occluded inferior division. The micro catheter was advanced to the M2 M3 region over the 0.014 inch micro guidewire. The guidewire was removed. Good aspiration obtained from the hub of the micro catheter. A 4 mm x 40 mm Solitaire FR retrieval device was then advanced into the distal end of the micro catheter. This was then again deployed as mentioned earlier. Proximal flow arrest was then provided by inflating the balloon in the right internal carotid artery. A control arteriogram performed through the [place] guide catheter demonstrated complete revascularization of the inferior division and also opening up of a more prominent branch of the inferior division. With proximal flow arrest, aspiration was applied using a 60 mL syringe at the hub of the 8 [place] FlowGate guide catheter. The combination of the retrieval device and the micro catheter were then retrieved and removed. The balloon was deflated in the are right internal carotid artery. Free back flow was noted at the hub of the Tuohy [place]. A control arteriogram performed through the 8 [place] FlowGate guide catheter demonstrated complete angiographic revascularization of the right middle cerebral artery superior and inferior division. A posterior parietal branch of the M3 region remained occluded with collateral flow from the adjacent branches. The procedure was then stopped. A final control arteriogram performed  through the 8 [place] FlowGate guide catheter in the right internal carotid artery demonstrated complete revascularization of the right middle cerebellar artery proximally, the right internal carotid artery and the right anterior cerebral artery. The division of the right middle cerebral artery was widely patent. The inferior division was widely patent except for the M3 posterior parietal branch occlusion. No mass-effect or midline shift of the major vessel intracranially was seen. No extravasation of contrast was seen. Throughout the procedure, the patient's blood pressure and neurological status remained stable. The 8 [place] FlowGate guide catheter was then retrieved into the abdominal aorta as was the 8 French 80 cm Arrow neurovascular sheath. These were then exchanged over a J-tip guidewire for a 9 French Pinnacle sheath. The patient's distal pulses remained Dopplerable at the end of the procedure unchanged from before the procedure. The 9 French Pinnacle sheath was then connected to continuous heparinized saline infusion. The patient was then transferred to the CT scanner for postprocedural CT scan of the brain. IMPRESSION: Status post endovascular complete revascularization of occluded right internal carotid artery involving the cavernous segment, and the supraclinoid segment, and also the right anterior cerebral artery and the right middle cerebral artery achieving a TICI 2b revascularization requiring a Solitaire FR 4 mm x 40 mm retrieval device, and 1 pass with the combination of the 6 mm x 30 min retrieval device in combination with an Ace 64 aspiration, and a total of 7.2 mg of intra-arterial and super selective intracranial Integrilin as described above. Electronically Signed   By: Luanne Bras M.D.   On: 10/26/2016 18:39   Ct Head Code Stroke W/o Cm  Result Date: 10/23/2016 CLINICAL DATA:  Code stroke.  Left-sided weakness EXAM: CT HEAD WITHOUT CONTRAST TECHNIQUE: Contiguous axial images were  obtained from the base of the skull through the vertex without intravenous contrast. COMPARISON:  None. FINDINGS: Brain: No mass lesion, intraparenchymal hemorrhage or extra-axial collection. No evidence of acute infarct. No midline shift  or hydrocephalus. There is periventricular hypoattenuation compatible with chronic microvascular disease. There is diffuse cerebral atrophy without lobar predominance. Vascular: There is atherosclerotic calcification of the carotid and vertebral arteries at the skullbase. Skull: No skull fracture or focal calvarial lesion. Visualized skull base is normal. Sinuses/Orbits: The visualized portions of the paranasal sinuses and mastoid air cells are free of fluid. No advanced mucosal thickening. The visualized orbits are normal. Other: None ASPECTS ([place] Stroke Program Early CT Score) - Ganglionic level infarction (caudate, lentiform nuclei, internal capsule, insula, M1-M3 cortex): 7 - Supraganglionic infarction (M4-M6 cortex): 3 Total score (0-10 with 10 being normal): 10 IMPRESSION: 1. No acute intracranial hemorrhage. 2. ASPECTS is 10.  No CT evidence of acute cortical infarct. These results were called by telephone at the time of interpretation on 10/23/2016 at 4:10 am to Dr. Jeanene Erb, who verbally acknowledged these results. Electronically Signed   By: Ulyses Jarred M.D.   On: 10/23/2016 04:10   Ir Angio Intra Extracran Sel Com Carotid Innominate Uni L Mod Sed  Result Date: 10/27/2016 CLINICAL DATA:  Left-sided weakness. Right disease gaze deviation. Abnormal CT angiogram of the head and neck. EXAM: IR PERCUTANEOUS ART THORMBECTOMY/INFUSION INTRACRANIAL INCLUDE DIAG ANGIO. BILATERAL COMMON CAROTID ARTERIOGRAMS, RIGHT VERTEBRAL ARTERY ANGIOGRAM FOLLOWED BY ENDOVASCULAR COMPLETE REVASCULARIZATION OF OCCLUDED RIGHT INTERNAL CAROTID ARTERY INTRACRANIALLY AND THE RIGHT MIDDLE CEREBRAL ARTERY AND THE RIGHT ANTERIOR CEREBRAL ARTERY USING MECHANICAL THROMBECTOMY, COMBINATION OF  MECHANICAL THROMBECTOMY AND PENUMBRA ASPIRATION, AND 7.2 MG OF SUPER SELECTIVE INTRACRANIAL INTRA-ARTERIAL INTEGRILIN PROCEDURE: Following a full explanation of the procedure along with the potential associated complications, an informed witnessed consent was obtained. Risks of intracranial hemorrhage of 10%, worsening neurological deficit, ventilator dependency and death were all reviewed in detail with the patient's family. Informed consent was obtained from the patient's son. The patient was then put under general anesthesia by the Department of Anesthesiology at [facility] The right groin was prepped and draped in the usual sterile fashion. Thereafter using modified Seldinger technique, transfemoral access into the right common femoral artery was obtained without difficulty. Over a 0.035 inch guidewire a 5 French Pinnacle sheath was inserted. Through this, and also over a 0.035 inch guidewire a 5 [place] JB 1 catheter was advanced to the aortic arch region and selectively positioned in the right common carotid artery, the right subclavian artery and the left common carotid artery. There were no acute complications. The patient tolerated the procedure well. Contrast: Isovue 300 approximately 120 mL. Anesthesia/Sedation:  General anesthesia. Medications: As per general anesthesia. FINDINGS: The right subclavian arteriogram demonstrates a moderate 50% stenosis at the origin of the right vertebral artery. The vessel is otherwise seen to opacify to the cranial skull base. Patency is seen of the right vertebrobasilar junction and the right posterior inferior cerebellar artery. The opacified portions of the basilar artery, the right posterior cerebral artery, the superior cerebellar arteries and the right anterior-inferior cerebellar artery is normal into the delayed arterial and capillary phases. Non-opacified blood is seen in the basilar artery from the contralateral vertebral artery. The left common carotid  arteriogram demonstrates the distal left cervical ICA to be widely patent. The petrous, the cavernous and supraclinoid segments are widely patent. A dominant left posterior communicating artery is seen opacifying the left posterior cerebral artery distribution. The left middle cerebral artery and the left anterior cerebral artery are seen to opacify into the capillary and venous phases. Prompt opacification via the anterior communicating artery of the right anterior cerebral artery A1  segment and A2 segment is noted. Delayed arterial phase demonstrates retrograde opacification of the right peri-insular area opacification from the right pericallosal and the callosal marginal branches. The right common carotid arteriogram demonstrates opacification of the right external carotid artery and its major branches to be widely patent. 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 There is no intracranial reconstitution from the external carotid artery branches. ENDOVASCULAR TREATMENT OF OCCLUDED RIGHT INTERNAL CAROTID ARTERY AT THE CRANIAL SKULL BASE, THE RIGHT MIDDLE CEREBRAL ARTERY AND RIGHT ANTERIOR CEREBRAL ARTERY USING MECHANICAL THROMBECTOMY X 4 PASSES, AND 1 PASS WITH A COMBINATION OF THE SOLITAIRE FR 4 MM X 40 MM DEVICE, AND MECHANICAL ASPIRATION USING AN ACE 64 ASPIRATION CATHETER, AND 7.2 MG OF SUPER SELECTIVE INTRACRANIAL INTRA-ARTERIAL INTEGRILIN ACHIEVING A TICI 2B REPERFUSION The diagnostic JB 1 catheter in the right common carotid artery was exchanged over a 0.035 inch 300 cm Rosen exchange guidewire for an 8 French 80 cm Arrow neurovascular sheath using biplane roadmap technique and constant fluoroscopic guidance. Good aspiration was obtained from the side port of the hub of the Arrow neurovascular sheath. A gentle contrast injection demonstrated no evidence of spasms, dissections or of intraluminal filling defects. This was then  connected to continuous heparinized saline infusion. Over the [facility] exchange guidewire, a 95 cm FlowGate balloon guide catheter which had been prepped with 50% contrast and 50% heparinized saline infusion was then advanced and positioned in the right common carotid artery proximal to the carotid bifurcation. The guidewire was removed. Good aspiration obtained from the hub of the 8 [place] FlowGate guide catheter. A gentle contrast injection demonstrated no evidence of spasms, dissections or of intraluminal filling defects. Over a 0.035 inch Roadrunner guidewire, using biplane roadmap technique and constant fluoroscopic guidance, the 8 [place] FlowGate guide catheter was then advanced to the mid cervical right ICA. The guidewire was removed. Again good aspiration obtained from the hub of the [facility] guide catheter. However, a gentle control arteriogram again demonstrated complete occlusion of the right internal carotid artery at the level of the proximal cavernous segment. Also demonstrated at this time was opacification of the carotid cavernous sinus on the right-side and then the left-side with subsequent opacification of the inferior petrosal sinuses. 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 At this time, in a coaxial manner and with constant heparinized saline infusion a Trevo ProVue micro catheter was advanced over a 0.014 inch Softip Synchro micro guidewire to the proximal cavernous segment. The guidewire was removed. Good aspiration obtained from the hub of the micro catheter. At this time, approximately 3.6 mg of super selective intracranial intra-arterial Integrelin was injected over a period of approximately a minute. A gentle contrast injection through the micro catheter demonstrated large filling defect in the cavernous segment with opacification just distal to the ophthalmic artery and then complete occlusion. Over a 0.014 inch Softip Synchro micro guidewire, the micro catheter was then gently  advanced into the supraclinoid right ICA. The micro guidewire had a J-tip configuration to avoid dissections or inducing spasms. Using a torque device, access was obtained into the right middle cerebral artery followed by the micro catheter which was then advanced to the distal M2 M3 region of the inferior division of the right middle cerebral artery followed by the micro catheter. The micro guidewire was removed. Poor aspiration was obtained at the hub of the micro catheter. A very gentle contrast injection demonstrated slow opacification just distal to the  micro catheter with a small filling defect. This necessitated the use of another 1.8 mg of super selective intracranial intra-arterial Integrilin. Additionally 1.8 mg of intra-arterial Integrilin was infused through the 8 [place] FlowGate guide catheter in the right internal carotid artery. Thereafter, a 4 mm x 40 mm Solitaire FR retrieval device which had been prepped and purged with 50% contrast and 50% heparinized saline infusion was then advanced to the distal end of the micro catheter. The entire system was then straightened. After positioning the unopened retrieval device and the tip of the micro catheter appropriately, to cover the occluded segment of the right middle cerebral artery, the O-rings on the delivery micro catheter and the micro guidewire were loosened. With slight forward traction with the right hand on the delivery micro guidewire, with the left hand the delivery micro catheter was then retrieved unsheathing the device. This was left opened for approximately 1 minute. A control arteriogram performed through the 8 [place] FlowGate guide catheter demonstrated no opacification beyond the distal cavernous segment. The balloon was then inflated in the right ICA of the [facility] guide catheter for proximal flow arrest. Thereafter using a 60 mL syringe constant aspiration was then applied as the combination of the retrieval device with the micro  catheter was then retrieved and removed. Chunks of clot were seen on the retrieval device, and also in the [place]. This was continued as the balloon was then deflated. Because of the occlusion of the guide catheter, the entire system had to be removed. The [facility] guide catheter contained a few chunks of clot as well. A diagnostic catheter was then reintroduced into right common carotid artery. A control arteriogram performed demonstrated improved flow in the supraclinoid right ICA but essentially occluded right middle cerebral artery and the right anterior cerebral artery. At this time, a second pass was made through the 8 French 95 cm FlowGate guide catheter which had been introduced into the right internal carotid artery mid cervical right ICA as mentioned above. Again after having covered the area of the large filling defect, this was then deployed as described earlier. With proximal flow arrest in the right internal carotid artery, the combination of the retrieval device, and micro catheter were then gently retrieved and removed as mentioned early as aspiration was continued. Again a few chunks of clot were seen on the retrieval device. The aspirate did not have any clots. Aspiration was continued as the balloon was then deflated. A control arteriogram performed through the 8 [place] FlowGate guide catheter now demonstrated a large filling defect sitting in the supraclinoid right ICA, with poor filling of the anterior cerebral artery, and large clot sitting in the cavernous segment. This prompted a third pass with the 4 mm x 40 mm Solitaire FR retrieval device as mentioned above. At this time following the retrieval of the retrieval device with the micro catheter revealed more clot in the [employer] [place] and also on the retrieval device. After having deflated the balloon in the right internal carotid artery, control arteriogram performed demonstrated now opacification of the right internal carotid artery the  petrous, the cavernous and supraclinoid segment but also demonstrated pitiful flow into the right middle and the proximal right anterior cerebral artery. Opacification of the anterior choroidal artery was now noted. At this time, a combination of the Trevo ProVue micro catheter inside of an Ace 64 Penumbra guide catheter was introduced over a 0.014 inch Softip Synchro micro guidewire through the [facility] guide catheter without difficulty. This combination  was then advanced without difficulty to the supraclinoid right ICA. The micro guidewire was then gently introduced into the right middle cerebral artery into the M2 M3 region of the inferior division followed by the micro catheter. The guidewire was removed. Again a gentle contrast injection demonstrated filling defects which prompted the use of another 1.8 mg of super selective intracranial intra-arterial Integrilin. A control arteriogram performed after deployment of the device demonstrated opacification into the now distal right MCA and also the bifurcation branches with opacification of the superior division. Prominent filling defects continued to be present in the inferior division and also in the proximal right middle cerebral artery and supraclinoid right ICA. At this time, the Ace 64 catheter was advanced into the supraclinoid right ICA just adjacent to the large filling defect. Thereafter proximal flow arrest was performed by inflating the balloon in the right internal carotid artery. The proximal portion of the 6 mm x 30 mm retrieval device was then captured into the 3 Max Penumbra micro catheter and vacuum aspiration was applied at the hub of the Ace 64 guide catheter in the supraclinoid right ICA just into the filling defect. This was continued as the combination of the retrieval device and the micro catheter were gently retrieved and removed whilst aspiration was continued in the Ace 64 guide catheter hub, and also using a 60 mL syringe at the hub of  the 8 [place] FlowGate guide catheter. These were continued as the combination of the retrieval device and the micro catheter were retrieved and removed. This contained chunks of clot in the Tu[employer] [place] and also on the retrieval device. Aspiration was continued in the 8 [place] FlowGate guide catheter as the Ace guide catheter was retrieved and removed as there was blockage of this catheter. After removal of this, the aspiration was continued in the 8 [place] FlowGate guide guide catheter until there was free flow. The Ace aspiration catheter contained chunks of clot. Also the aspirate did contain some clot. After having established free flow in the 8 [place] FlowGate guide catheter in the right internal carotid artery, a control arteriogram performed through the [facility] guide catheter in the right internal carotid artery demonstrated complete angiographic revascularization of the internal carotid artery on the right, the right anterior cerebral artery, and the right middle cerebral artery. There was free flow noted in the superior division. However, filling defect was noted in the proximal inferior division though slow flow was noted distal to this. This prompted the advancement of a Trevo ProVue micro catheter and Softip Synchro micro guidewire inside of a 125 cm [place] guide catheter to the supraclinoid right ICA. The micro guidewire was then gently manipulated into the right middle cerebral artery and followed by the micro catheter. Free easy access was obtained into the nearly occluded inferior division. The micro catheter was advanced to the M2 M3 region over the 0.014 inch micro guidewire. The guidewire was removed. Good aspiration obtained from the hub of the micro catheter. A 4 mm x 40 mm Solitaire FR retrieval device was then advanced into the distal end of the micro catheter. This was then again deployed as mentioned earlier. Proximal flow arrest was then provided by inflating the balloon in the right  internal carotid artery. A control arteriogram performed through the [place] guide catheter demonstrated complete revascularization of the inferior division and also opening up of a more prominent branch of the inferior division. With proximal flow arrest, aspiration was applied using a 60 mL  syringe at the hub of the 8 [place] FlowGate guide catheter. The combination of the retrieval device and the micro catheter were then retrieved and removed. The balloon was deflated in the are right internal carotid artery. Free back flow was noted at the hub of the Tuohy [place]. A control arteriogram performed through the 8 [place] FlowGate guide catheter demonstrated complete angiographic revascularization of the right middle cerebral artery superior and inferior division. A posterior parietal branch of the M3 region remained occluded with collateral flow from the adjacent branches. The procedure was then stopped. A final control arteriogram performed through the 8 [place] FlowGate guide catheter in the right internal carotid artery demonstrated complete revascularization of the right middle cerebellar artery proximally, the right internal carotid artery and the right anterior cerebral artery. The division of the right middle cerebral artery was widely patent. The inferior division was widely patent except for the M3 posterior parietal branch occlusion. No mass-effect or midline shift of the major vessel intracranially was seen. No extravasation of contrast was seen. Throughout the procedure, the patient's blood pressure and neurological status remained stable. The 8 [place] FlowGate guide catheter was then retrieved into the abdominal aorta as was the 8 French 80 cm Arrow neurovascular sheath. These were then exchanged over a J-tip guidewire for a 9 French Pinnacle sheath. The patient's distal pulses remained Dopplerable at the end of the procedure unchanged from before the procedure. The 9 French Pinnacle sheath was then  connected to continuous heparinized saline infusion. The patient was then transferred to the CT scanner for postprocedural CT scan of the brain. IMPRESSION: Status post endovascular complete revascularization of occluded right internal carotid artery involving the cavernous segment, and the supraclinoid segment, and also the right anterior cerebral artery and the right middle cerebral artery achieving a TICI 2b revascularization requiring a Solitaire FR 4 mm x 40 mm retrieval device, and 1 pass with the combination of the 6 mm x 30 min retrieval device in combination with an Ace 64 aspiration, and a total of 7.2 mg of intra-arterial and super selective intracranial Integrilin as described above. Electronically Signed   By: Luanne Bras M.D.   On: 10/26/2016 18:39   Ir Angio Vertebral Sel Subclavian Innominate Uni R Mod Sed  Result Date: 10/27/2016 CLINICAL DATA:  Left-sided weakness. Right disease gaze deviation. Abnormal CT angiogram of the head and neck. EXAM: IR PERCUTANEOUS ART THORMBECTOMY/INFUSION INTRACRANIAL INCLUDE DIAG ANGIO. BILATERAL COMMON CAROTID ARTERIOGRAMS, RIGHT VERTEBRAL ARTERY ANGIOGRAM FOLLOWED BY ENDOVASCULAR COMPLETE REVASCULARIZATION OF OCCLUDED RIGHT INTERNAL CAROTID ARTERY INTRACRANIALLY AND THE RIGHT MIDDLE CEREBRAL ARTERY AND THE RIGHT ANTERIOR CEREBRAL ARTERY USING MECHANICAL THROMBECTOMY, COMBINATION OF MECHANICAL THROMBECTOMY AND PENUMBRA ASPIRATION, AND 7.2 MG OF SUPER SELECTIVE INTRACRANIAL INTRA-ARTERIAL INTEGRILIN PROCEDURE: Following a full explanation of the procedure along with the potential associated complications, an informed witnessed consent was obtained. Risks of intracranial hemorrhage of 10%, worsening neurological deficit, ventilator dependency and death were all reviewed in detail with the patient's family. Informed consent was obtained from the patient's son. The patient was then put under general anesthesia by the Department of Anesthesiology at [facility] The right groin was prepped and draped in the usual sterile fashion. Thereafter using modified Seldinger technique, transfemoral access into the right common femoral artery was obtained without difficulty. Over a 0.035 inch guidewire a 5 French Pinnacle sheath was inserted. Through this, and also over a 0.035 inch guidewire a 5 [place] JB 1 catheter was advanced to the aortic arch region and  selectively positioned in the right common carotid artery, the right subclavian artery and the left common carotid artery. There were no acute complications. The patient tolerated the procedure well. Contrast: Isovue 300 approximately 120 mL. Anesthesia/Sedation:  General anesthesia. Medications: As per general anesthesia. FINDINGS: The right subclavian arteriogram demonstrates a moderate 50% stenosis at the origin of the right vertebral artery. The vessel is otherwise seen to opacify to the cranial skull base. Patency is seen of the right vertebrobasilar junction and the right posterior inferior cerebellar artery. The opacified portions of the basilar artery, the right posterior cerebral artery, the superior cerebellar arteries and the right anterior-inferior cerebellar artery is normal into the delayed arterial and capillary phases. Non-opacified blood is seen in the basilar artery from the contralateral vertebral artery. The left common carotid arteriogram demonstrates the distal left cervical ICA to be widely patent. The petrous, the cavernous and supraclinoid segments are widely patent. A dominant left posterior communicating artery is seen opacifying the left posterior cerebral artery distribution. The left middle cerebral artery and the left anterior cerebral artery are seen to opacify into the capillary and venous phases. Prompt opacification via the anterior communicating artery of the right anterior cerebral artery A1 segment and A2 segment is noted. Delayed arterial phase demonstrates retrograde opacification  of the right peri-insular area opacification from the right pericallosal and the callosal marginal branches. The right common carotid arteriogram demonstrates opacification of the right external carotid artery and its major branches to be widely patent. 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 There is no intracranial reconstitution from the external carotid artery branches. ENDOVASCULAR TREATMENT OF OCCLUDED RIGHT INTERNAL CAROTID ARTERY AT THE CRANIAL SKULL BASE, THE RIGHT MIDDLE CEREBRAL ARTERY AND RIGHT ANTERIOR CEREBRAL ARTERY USING MECHANICAL THROMBECTOMY X 4 PASSES, AND 1 PASS WITH A COMBINATION OF THE SOLITAIRE FR 4 MM X 40 MM DEVICE, AND MECHANICAL ASPIRATION USING AN ACE 64 ASPIRATION CATHETER, AND 7.2 MG OF SUPER SELECTIVE INTRACRANIAL INTRA-ARTERIAL INTEGRILIN ACHIEVING A TICI 2B REPERFUSION The diagnostic JB 1 catheter in the right common carotid artery was exchanged over a 0.035 inch 300 cm Rosen exchange guidewire for an 8 French 80 cm Arrow neurovascular sheath using biplane roadmap technique and constant fluoroscopic guidance. Good aspiration was obtained from the side port of the hub of the Arrow neurovascular sheath. A gentle contrast injection demonstrated no evidence of spasms, dissections or of intraluminal filling defects. This was then connected to continuous heparinized saline infusion. Over the [facility] exchange guidewire, a 95 cm FlowGate balloon guide catheter which had been prepped with 50% contrast and 50% heparinized saline infusion was then advanced and positioned in the right common carotid artery proximal to the carotid bifurcation. The guidewire was removed. Good aspiration obtained from the hub of the 8 [place] FlowGate guide catheter. A gentle contrast injection demonstrated no evidence of spasms, dissections or of intraluminal filling defects. Over a 0.035 inch Roadrunner guidewire, using biplane  roadmap technique and constant fluoroscopic guidance, the 8 [place] FlowGate guide catheter was then advanced to the mid cervical right ICA. The guidewire was removed. Again good aspiration obtained from the hub of the [facility] guide catheter. However, a gentle control arteriogram again demonstrated complete occlusion of the right internal carotid artery at the level of the proximal cavernous segment. Also demonstrated at this time was opacification of the carotid cavernous sinus on the right-side and then the left-side with subsequent opacification of the inferior petrosal  sinuses. 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 At this time, in a coaxial manner and with constant heparinized saline infusion a Trevo ProVue micro catheter was advanced over a 0.014 inch Softip Synchro micro guidewire to the proximal cavernous segment. The guidewire was removed. Good aspiration obtained from the hub of the micro catheter. At this time, approximately 3.6 mg of super selective intracranial intra-arterial Integrelin was injected over a period of approximately a minute. A gentle contrast injection through the micro catheter demonstrated large filling defect in the cavernous segment with opacification just distal to the ophthalmic artery and then complete occlusion. Over a 0.014 inch Softip Synchro micro guidewire, the micro catheter was then gently advanced into the supraclinoid right ICA. The micro guidewire had a J-tip configuration to avoid dissections or inducing spasms. Using a torque device, access was obtained into the right middle cerebral artery followed by the micro catheter which was then advanced to the distal M2 M3 region of the inferior division of the right middle cerebral artery followed by the micro catheter. The micro guidewire was removed. Poor aspiration was obtained at the hub of the micro catheter. A very gentle contrast injection demonstrated slow opacification just distal to the micro catheter  with a small filling defect. This necessitated the use of another 1.8 mg of super selective intracranial intra-arterial Integrilin. Additionally 1.8 mg of intra-arterial Integrilin was infused through the 8 [place] FlowGate guide catheter in the right internal carotid artery. Thereafter, a 4 mm x 40 mm Solitaire FR retrieval device which had been prepped and purged with 50% contrast and 50% heparinized saline infusion was then advanced to the distal end of the micro catheter. The entire system was then straightened. After positioning the unopened retrieval device and the tip of the micro catheter appropriately, to cover the occluded segment of the right middle cerebral artery, the O-rings on the delivery micro catheter and the micro guidewire were loosened. With slight forward traction with the right hand on the delivery micro guidewire, with the left hand the delivery micro catheter was then retrieved unsheathing the device. This was left opened for approximately 1 minute. A control arteriogram performed through the 8 [place] FlowGate guide catheter demonstrated no opacification beyond the distal cavernous segment. The balloon was then inflated in the right ICA of the [facility] guide catheter for proximal flow arrest. Thereafter using a 60 mL syringe constant aspiration was then applied as the combination of the retrieval device with the micro catheter was then retrieved and removed. Chunks of clot were seen on the retrieval device, and also in the [place]. This was continued as the balloon was then deflated. Because of the occlusion of the guide catheter, the entire system had to be removed. The [facility] guide catheter contained a few chunks of clot as well. A diagnostic catheter was then reintroduced into right common carotid artery. A control arteriogram performed demonstrated improved flow in the supraclinoid right ICA but essentially occluded right middle cerebral artery and the right anterior cerebral  artery. At this time, a second pass was made through the 8 French 95 cm FlowGate guide catheter which had been introduced into the right internal carotid artery mid cervical right ICA as mentioned above. Again after having covered the area of the large filling defect, this was then deployed as described earlier. With proximal flow arrest in the right internal carotid artery, the combination of the retrieval device, and micro catheter were then gently retrieved and removed as mentioned early as aspiration  was continued. Again a few chunks of clot were seen on the retrieval device. The aspirate did not have any clots. Aspiration was continued as the balloon was then deflated. A control arteriogram performed through the 8 [place] FlowGate guide catheter now demonstrated a large filling defect sitting in the supraclinoid right ICA, with poor filling of the anterior cerebral artery, and large clot sitting in the cavernous segment. This prompted a third pass with the 4 mm x 40 mm Solitaire FR retrieval device as mentioned above. At this time following the retrieval of the retrieval device with the micro catheter revealed more clot in the [place] and also on the retrieval device. After having deflated the balloon in the right internal carotid artery, control arteriogram performed demonstrated now opacification of the right internal carotid artery the petrous, the cavernous and supraclinoid segment but also demonstrated pitiful flow into the right middle and the proximal right anterior cerebral artery. Opacification of the anterior choroidal artery was now noted. At this time, a combination of the Trevo ProVue micro catheter inside of an Ace 64 Penumbra guide catheter was introduced over a 0.014 inch Softip Synchro micro guidewire through the [facility] guide catheter without difficulty. This combination was then advanced without difficulty to the supraclinoid right ICA. The micro guidewire was then gently introduced into  the right middle cerebral artery into the M2 M3 region of the inferior division followed by the micro catheter. The guidewire was removed. Again a gentle contrast injection demonstrated filling defects which prompted the use of another 1.8 mg of super selective intracranial intra-arterial Integrilin. A control arteriogram performed after deployment of the device demonstrated opacification into the now distal right MCA and also the bifurcation branches with opacification of the superior division. Prominent filling defects continued to be present in the inferior division and also in the proximal right middle cerebral artery and supraclinoid right ICA. At this time, the Ace 64 catheter was advanced into the supraclinoid right ICA just adjacent to the large filling defect. Thereafter proximal flow arrest was performed by inflating the balloon in the right internal carotid artery. The proximal portion of the 6 mm x 30 mm retrieval device was then captured into the 3 Max Penumbra micro catheter and vacuum aspiration was applied at the hub of the Ace 64 guide catheter in the supraclinoid right ICA just into the filling defect. This was continued as the combination of the retrieval device and the micro catheter were gently retrieved and removed whilst aspiration was continued in the Ace 64 guide catheter hub, and also using a 60 mL syringe at the hub of the 8 [place] FlowGate guide catheter. These were continued as the combination of the retrieval device and the micro catheter were retrieved and removed. This contained chunks of clot in the Tu[employer] [place] and also on the retrieval device. Aspiration was continued in the 8 [place] FlowGate guide catheter as the Ace guide catheter was retrieved and removed as there was blockage of this catheter. After removal of this, the aspiration was continued in the 8 [place] FlowGate guide guide catheter until there was free flow. The Ace aspiration catheter contained chunks of clot. Also the  aspirate did contain some clot. After having established free flow in the 8 [place] FlowGate guide catheter in the right internal carotid artery, a control arteriogram performed through the [facility] guide catheter in the right internal carotid artery demonstrated complete angiographic revascularization of the internal carotid artery on the right, the right anterior cerebral artery,  and the right middle cerebral artery. There was free flow noted in the superior division. However, filling defect was noted in the proximal inferior division though slow flow was noted distal to this. This prompted the advancement of a Trevo ProVue micro catheter and Softip Synchro micro guidewire inside of a 125 cm [place] guide catheter to the supraclinoid right ICA. The micro guidewire was then gently manipulated into the right middle cerebral artery and followed by the micro catheter. Free easy access was obtained into the nearly occluded inferior division. The micro catheter was advanced to the M2 M3 region over the 0.014 inch micro guidewire. The guidewire was removed. Good aspiration obtained from the hub of the micro catheter. A 4 mm x 40 mm Solitaire FR retrieval device was then advanced into the distal end of the micro catheter. This was then again deployed as mentioned earlier. Proximal flow arrest was then provided by inflating the balloon in the right internal carotid artery. A control arteriogram performed through the [place] guide catheter demonstrated complete revascularization of the inferior division and also opening up of a more prominent branch of the inferior division. With proximal flow arrest, aspiration was applied using a 60 mL syringe at the hub of the 8 [place] FlowGate guide catheter. The combination of the retrieval device and the micro catheter were then retrieved and removed. The balloon was deflated in the are right internal carotid artery. Free back flow was noted at the hub of the [place]. A  control arteriogram performed through the 8 [place] FlowGate guide catheter demonstrated complete angiographic revascularization of the right middle cerebral artery superior and inferior division. A posterior parietal branch of the M3 region remained occluded with collateral flow from the adjacent branches. The procedure was then stopped. A final control arteriogram performed through the 8 [place] FlowGate guide catheter in the right internal carotid artery demonstrated complete revascularization of the right middle cerebellar artery proximally, the right internal carotid artery and the right anterior cerebral artery. The division of the right middle cerebral artery was widely patent. The inferior division was widely patent except for the M3 posterior parietal branch occlusion. No mass-effect or midline shift of the major vessel intracranially was seen. No extravasation of contrast was seen. Throughout the procedure, the patient's blood pressure and neurological status remained stable. The 8 [place] FlowGate guide catheter was then retrieved into the abdominal aorta as was the 8 French 80 cm Arrow neurovascular sheath. These were then exchanged over a J-tip guidewire for a 9 French Pinnacle sheath. The patient's distal pulses remained Dopplerable at the end of the procedure unchanged from before the procedure. The 9 French Pinnacle sheath was then connected to continuous heparinized saline infusion. The patient was then transferred to the CT scanner for postprocedural CT scan of the brain. IMPRESSION: Status post endovascular complete revascularization of occluded right internal carotid artery involving the cavernous segment, and the supraclinoid segment, and also the right anterior cerebral artery and the right middle cerebral artery achieving a TICI 2b revascularization requiring a Solitaire FR 4 mm x 40 mm retrieval device, and 1 pass with the combination of the 6 mm x 30 min retrieval device in combination with an  Ace 64 aspiration, and a total of 7.2 mg of intra-arterial and super selective intracranial Integrilin as described above. Electronically Signed   By: Luanne Bras M.D.   On: 10/26/2016 18:39     Subjective:  Discharge Exam: Vitals:   11/20/16 0345 11/20/16 0409 11/20/16  0803 11/20/16 1145  BP:  (!) 113/94    Pulse:  96 92 85  Resp:  18 16 20   Temp:  99.4 F (37.4 C)    TempSrc:  Oral    SpO2: 97% 100% 100% 100%  Weight:  81.6 kg (180 lb)    Height:       General: Pt is alert, awake, not in acute distress Cardiovascular: RRR, S1/S2 +, no rubs, no gallops Respiratory: CTA bilaterally, no wheezing, no rhonchi Abdominal: Soft, NT, ND, bowel sounds + Extremities: no edema, no cyanosis   The results of significant diagnostics from this hospitalization (including imaging, microbiology, ancillary and laboratory) are listed below for reference.    Microbiology: Recent Results (from the past 240 hour(s))  Culture, respiratory (NON-Expectorated)     Status: None   Collection Time: 11/10/16 10:53 PM  Result Value Ref Range Status   Specimen Description TRACHEAL ASPIRATE  Final   Special Requests NONE  Final   Gram Stain   Final    FEW WBC PRESENT,BOTH PMN AND MONONUCLEAR MODERATE GRAM NEGATIVE RODS FEW GRAM POSITIVE RODS FEW GRAM POSITIVE COCCI IN PAIRS    Culture Consistent with normal respiratory flora.  Final   Report Status 11/12/2016 FINAL  Final     Labs: BNP (last 3 results) No results for input(s): BNP in the last 8760 hours. Basic Metabolic Panel:  Recent Labs Lab 11/14/16 0307 11/15/16 0145 11/16/16 0211 11/17/16 0523 11/18/16 0559 11/19/16 0507 11/20/16 0525  NA 139 137 134* 134* 133* 136 137  K 4.0 3.9 4.1 4.0 4.0 4.1 3.8  CL 106 103 103 101 98* 103 100*  CO2 26 25 24 24 26 26 27   GLUCOSE 113* 142* 131* 123* 164* 158* 119*  BUN 14 15 13 10 15 15 15   CREATININE 0.57* 0.67 0.60* 0.59* 0.67 0.64 0.61  CALCIUM 8.1* 7.9* 7.6* 8.0* 8.0* 8.0* 8.1*   MG 2.3 2.2 2.1 2.1  --   --   --   PHOS 3.7 3.7 3.4 3.8  --   --   --    Liver Function Tests:  Recent Labs Lab 11/14/16 0307 11/15/16 0145 11/16/16 0211 11/17/16 0523  ALBUMIN 2.1* 2.1* 2.3* 2.3*   No results for input(s): LIPASE, AMYLASE in the last 168 hours. No results for input(s): AMMONIA in the last 168 hours. CBC:  Recent Labs Lab 11/14/16 0307 11/15/16 0145 11/16/16 0211 11/17/16 0523 11/18/16 0559 11/20/16 0525  WBC 6.2 5.2 6.3 6.8 6.2 5.9  NEUTROABS 4.2 3.3 4.2 4.9  --   --   HGB 9.6* 9.2* 9.5* 9.6* 9.5* 9.7*  HCT 30.3* 29.4* 29.0* 29.8* 29.5* 30.6*  MCV 87.3 88.3 87.9 87.4 86.8 88.2  PLT 368 340 257 265 262 218   Cardiac Enzymes: No results for input(s): CKTOTAL, CKMB, CKMBINDEX, TROPONINI in the last 168 hours. BNP: Invalid input(s): POCBNP CBG:  Recent Labs Lab 11/19/16 2054 11/20/16 0021 11/20/16 0416 11/20/16 0800 11/20/16 1212  GLUCAP 115* 171* 137* 137* 156*   D-Dimer No results for input(s): DDIMER in the last 72 hours. Hgb A1c No results for input(s): HGBA1C in the last 72 hours. Lipid Profile No results for input(s): CHOL, HDL, LDLCALC, TRIG, CHOLHDL, LDLDIRECT in the last 72 hours. Thyroid function studies No results for input(s): TSH, T4TOTAL, T3FREE, THYROIDAB in the last 72 hours.  Invalid input(s): FREET3 Anemia work up No results for input(s): VITAMINB12, FOLATE, FERRITIN, TIBC, IRON, RETICCTPCT in the last 72 hours. Urinalysis  Component Value Date/Time   COLORURINE AMBER (A) 10/29/2016 1330   APPEARANCEUR TURBID (A) 10/29/2016 1330   LABSPEC 1.024 10/29/2016 1330   PHURINE 8.5 (H) 10/29/2016 1330   GLUCOSEU NEGATIVE 10/29/2016 1330   HGBUR NEGATIVE 10/29/2016 1330   BILIRUBINUR MODERATE (A) 10/29/2016 1330   KETONESUR NEGATIVE 10/29/2016 1330   PROTEINUR 100 (A) 10/29/2016 1330   NITRITE POSITIVE (A) 10/29/2016 1330   LEUKOCYTESUR SMALL (A) 10/29/2016 1330   Sepsis Labs Invalid input(s): PROCALCITONIN,  WBC,   LACTICIDVEN Microbiology Recent Results (from the past 240 hour(s))  Culture, respiratory (NON-Expectorated)     Status: None   Collection Time: 11/10/16 10:53 PM  Result Value Ref Range Status   Specimen Description TRACHEAL ASPIRATE  Final   Special Requests NONE  Final   Gram Stain   Final    FEW WBC PRESENT,BOTH PMN AND MONONUCLEAR MODERATE GRAM NEGATIVE RODS FEW GRAM POSITIVE RODS FEW GRAM POSITIVE COCCI IN PAIRS    Culture Consistent with normal respiratory flora.  Final   Report Status 11/12/2016 FINAL  Final     Time coordinating discharge: Over 30 minutes  SIGNED:   Birdie Hopes, MD  [facility] 11/20/2016, 2:02 PM Pager   If 7PM-7AM, please contact night-coverage [email or web address] Password [email or web address]

## 2016-11-20 NOTE — Care Management Important Message (Signed)
Important Message  Patient Details  Name: Sean [name] MRN: [id number] Date of Birth: 11-03-1926   Medicare Important Message Given:  Yes    Kyla BalzarineShealy, Kensie Susman Abena 11/20/2016, 12:25 PM

## 2016-11-20 NOTE — Progress Notes (Signed)
Called by RN that pt's trach was out. Upon my arrival the pt had cough the trach partially out. Janina Mayorach was replaced without incident. Janina Mayorach ties were tightened. Some blood tinge secretions present from replacement of trach. RT will continue to monitor.

## 2016-11-23 ENCOUNTER — Inpatient Hospital Stay
Admission: EM | Admit: 2016-11-23 | Discharge: 2016-11-27 | DRG: 871 | Disposition: A | Discharge status: Skilled Nursing Facility | Payer: Medicare (Managed Care) | Attending: Internal Medicine | Admitting: Internal Medicine

## 2016-11-23 ENCOUNTER — Emergency Department: Payer: Medicare (Managed Care)

## 2016-11-23 DIAGNOSIS — L89153 Pressure ulcer of sacral region, stage 3: Secondary | ICD-10-CM | POA: Diagnosis present

## 2016-11-23 DIAGNOSIS — R4702 Dysphasia: Secondary | ICD-10-CM | POA: Diagnosis present

## 2016-11-23 DIAGNOSIS — J189 Pneumonia, unspecified organism: Secondary | ICD-10-CM

## 2016-11-23 DIAGNOSIS — L89892 Pressure ulcer of other site, stage 2: Secondary | ICD-10-CM | POA: Diagnosis present

## 2016-11-23 DIAGNOSIS — E876 Hypokalemia: Secondary | ICD-10-CM | POA: Diagnosis present

## 2016-11-23 DIAGNOSIS — Z7189 Other specified counseling: Secondary | ICD-10-CM | POA: Diagnosis not present

## 2016-11-23 DIAGNOSIS — G8194 Hemiplegia, unspecified affecting left nondominant side: Secondary | ICD-10-CM | POA: Diagnosis present

## 2016-11-23 DIAGNOSIS — I6521 Occlusion and stenosis of right carotid artery: Secondary | ICD-10-CM | POA: Diagnosis present

## 2016-11-23 DIAGNOSIS — I482 Chronic atrial fibrillation: Secondary | ICD-10-CM | POA: Diagnosis present

## 2016-11-23 DIAGNOSIS — Z8673 Personal history of transient ischemic attack (TIA), and cerebral infarction without residual deficits: Secondary | ICD-10-CM

## 2016-11-23 DIAGNOSIS — Z87891 Personal history of nicotine dependence: Secondary | ICD-10-CM

## 2016-11-23 DIAGNOSIS — R0602 Shortness of breath: Secondary | ICD-10-CM

## 2016-11-23 DIAGNOSIS — J690 Pneumonitis due to inhalation of food and vomit: Secondary | ICD-10-CM | POA: Diagnosis present

## 2016-11-23 DIAGNOSIS — E440 Moderate protein-calorie malnutrition: Secondary | ICD-10-CM | POA: Insufficient documentation

## 2016-11-23 DIAGNOSIS — A419 Sepsis, unspecified organism: Principal | ICD-10-CM | POA: Diagnosis present

## 2016-11-23 DIAGNOSIS — R4182 Altered mental status, unspecified: Secondary | ICD-10-CM | POA: Diagnosis present

## 2016-11-23 DIAGNOSIS — Z930 Tracheostomy status: Secondary | ICD-10-CM | POA: Diagnosis not present

## 2016-11-23 DIAGNOSIS — Z515 Encounter for palliative care: Secondary | ICD-10-CM | POA: Diagnosis not present

## 2016-11-23 DIAGNOSIS — I10 Essential (primary) hypertension: Secondary | ICD-10-CM | POA: Diagnosis present

## 2016-11-23 DIAGNOSIS — Z7901 Long term (current) use of anticoagulants: Secondary | ICD-10-CM

## 2016-11-23 DIAGNOSIS — R0600 Dyspnea, unspecified: Secondary | ICD-10-CM

## 2016-11-23 DIAGNOSIS — Z79899 Other long term (current) drug therapy: Secondary | ICD-10-CM | POA: Diagnosis not present

## 2016-11-23 DIAGNOSIS — E785 Hyperlipidemia, unspecified: Secondary | ICD-10-CM | POA: Diagnosis present

## 2016-11-23 DIAGNOSIS — Z931 Gastrostomy status: Secondary | ICD-10-CM | POA: Diagnosis not present

## 2016-11-23 DIAGNOSIS — I69352 Hemiplegia and hemiparesis following cerebral infarction affecting left dominant side: Secondary | ICD-10-CM

## 2016-11-23 LAB — CBC WITH DIFFERENTIAL/PLATELET
Basophils Absolute: 0.1 10*3/uL (ref 0–0.1)
Basophils Relative: 1 %
EOS ABS: 0.1 10*3/uL (ref 0–0.7)
Eosinophils Relative: 1 %
HEMATOCRIT: 32.2 % — AB (ref 40.0–52.0)
HEMOGLOBIN: 10.8 g/dL — AB (ref 13.0–18.0)
LYMPHS ABS: 1.6 10*3/uL (ref 1.0–3.6)
LYMPHS PCT: 18 %
MCH: 29.1 pg (ref 26.0–34.0)
MCHC: 33.7 g/dL (ref 32.0–36.0)
MCV: 86.4 fL (ref 80.0–100.0)
MONOS PCT: 9 %
Monocytes Absolute: 0.8 10*3/uL (ref 0.2–1.0)
NEUTROS PCT: 71 %
Neutro Abs: 6.7 10*3/uL — ABNORMAL HIGH (ref 1.4–6.5)
Platelets: 197 10*3/uL (ref 150–440)
RBC: 3.73 MIL/uL — AB (ref 4.40–5.90)
RDW: 16.7 % — ABNORMAL HIGH (ref 11.5–14.5)
WBC: 9.3 10*3/uL (ref 3.8–10.6)

## 2016-11-23 LAB — COMPREHENSIVE METABOLIC PANEL
ALT: 25 U/L (ref 17–63)
AST: 35 U/L (ref 15–41)
Albumin: 2.7 g/dL — ABNORMAL LOW (ref 3.5–5.0)
Alkaline Phosphatase: 66 U/L (ref 38–126)
Anion gap: 8 (ref 5–15)
BUN: 31 mg/dL — ABNORMAL HIGH (ref 6–20)
CHLORIDE: 105 mmol/L (ref 101–111)
CO2: 29 mmol/L (ref 22–32)
CREATININE: 0.86 mg/dL (ref 0.61–1.24)
Calcium: 8.2 mg/dL — ABNORMAL LOW (ref 8.9–10.3)
GFR calc non Af Amer: >60 mL/min (ref >60)
Glucose, Bld: 198 mg/dL — ABNORMAL HIGH (ref 65–99)
POTASSIUM: 4.2 mmol/L (ref 3.5–5.1)
SODIUM: 142 mmol/L (ref 135–145)
Total Bilirubin: 0.9 mg/dL (ref 0.3–1.2)
Total Protein: 7.7 g/dL (ref 6.5–8.1)

## 2016-11-23 LAB — URINALYSIS, COMPLETE (UACMP) WITH MICROSCOPIC
BACTERIA UA: NONE SEEN
BILIRUBIN URINE: NEGATIVE
Glucose, UA: NEGATIVE mg/dL
Hgb urine dipstick: NEGATIVE
KETONES UR: NEGATIVE mg/dL
LEUKOCYTES UA: NEGATIVE
Nitrite: NEGATIVE
PROTEIN: 30 mg/dL — AB
Specific Gravity, Urine: 1.026 (ref 1.005–1.030)
pH: 5 (ref 5.0–8.0)

## 2016-11-23 LAB — LACTIC ACID, PLASMA: LACTIC ACID, VENOUS: 1.8 mmol/L (ref 0.5–1.9)

## 2016-11-23 LAB — MRSA PCR SCREENING: MRSA by PCR: NEGATIVE

## 2016-11-23 MED ORDER — FREE WATER
100.0000 mL | Freq: Three times a day (TID) | Status: DC
  Administered 2016-11-23 – 2016-11-27 (×12): 100 mL

## 2016-11-23 MED ORDER — ACETAMINOPHEN 325 MG PO TABS: 650.0000 mg | ORAL_TABLET | Freq: Four times a day (QID) | ORAL | Status: DC | PRN

## 2016-11-23 MED ORDER — ENOXAPARIN SODIUM 40 MG/0.4ML ~~LOC~~ SOLN: 40.0000 mg | SUBCUTANEOUS | Status: DC

## 2016-11-23 MED ORDER — APIXABAN 5 MG PO TABS
5.0000 mg | ORAL_TABLET | Freq: Two times a day (BID) | ORAL | Status: DC
  Administered 2016-11-23 – 2016-11-27 (×8): 5 mg
  Filled 2016-11-23 (×8): qty 1

## 2016-11-23 MED ORDER — HYDRALAZINE HCL 50 MG PO TABS
50.0000 mg | ORAL_TABLET | Freq: Three times a day (TID) | ORAL | Status: DC
  Administered 2016-11-23 – 2016-11-27 (×10): 50 mg
  Filled 2016-11-23 (×11): qty 1

## 2016-11-23 MED ORDER — ACETAMINOPHEN 650 MG RE SUPP: 650.0000 mg | Freq: Four times a day (QID) | RECTAL | Status: DC | PRN

## 2016-11-23 MED ORDER — ONDANSETRON HCL 4 MG/2ML IJ SOLN: 4.0000 mg | Freq: Four times a day (QID) | INTRAMUSCULAR | Status: DC | PRN

## 2016-11-23 MED ORDER — VANCOMYCIN HCL IN DEXTROSE 1-5 GM/200ML-% IV SOLN
1000.0000 mg | Freq: Once | INTRAVENOUS | Status: AC
Start: 2016-11-23 — End: 2016-11-23
  Administered 2016-11-23: 1000 mg via INTRAVENOUS
  Filled 2016-11-23: qty 200

## 2016-11-23 MED ORDER — SODIUM CHLORIDE 0.9 % IV SOLN
INTRAVENOUS | Status: DC
  Administered 2016-11-23 – 2016-11-27 (×6): via INTRAVENOUS

## 2016-11-23 MED ORDER — BISACODYL 10 MG RE SUPP: 10.0000 mg | Freq: Every day | RECTAL | Status: DC | PRN

## 2016-11-23 MED ORDER — ONDANSETRON HCL 4 MG PO TABS: 4.0000 mg | ORAL_TABLET | Freq: Four times a day (QID) | ORAL | Status: DC | PRN

## 2016-11-23 MED ORDER — PIPERACILLIN-TAZOBACTAM 3.375 G IVPB
3.3750 g | Freq: Three times a day (TID) | INTRAVENOUS | Status: DC
  Administered 2016-11-23 – 2016-11-27 (×12): 3.375 g via INTRAVENOUS
  Filled 2016-11-23 (×11): qty 50

## 2016-11-23 MED ORDER — CEFEPIME-DEXTROSE 2 GM/50ML IV SOLR
2.0000 g | Freq: Once | INTRAVENOUS | Status: DC
  Administered 2016-11-23: 2 g via INTRAVENOUS
  Filled 2016-11-23: qty 50

## 2016-11-23 MED ORDER — BACITRACIN-POLYMYXIN B 500-10000 UNIT/GM OP OINT
TOPICAL_OINTMENT | Freq: Every day | OPHTHALMIC | Status: DC
  Administered 2016-11-23 – 2016-11-25 (×3): via OPHTHALMIC
  Administered 2016-11-26: 1 via OPHTHALMIC
  Filled 2016-11-23: qty 3.5

## 2016-11-23 MED ORDER — PANTOPRAZOLE SODIUM 40 MG PO PACK
40.0000 mg | PACK | Freq: Every day | ORAL | Status: DC
  Administered 2016-11-24 – 2016-11-27 (×4): 40 mg
  Filled 2016-11-23 (×4): qty 20

## 2016-11-23 MED ORDER — FUROSEMIDE 20 MG PO TABS
20.0000 mg | ORAL_TABLET | Freq: Every day | ORAL | Status: DC
  Administered 2016-11-24 – 2016-11-27 (×4): 20 mg via ORAL
  Filled 2016-11-23 (×4): qty 1

## 2016-11-23 MED ORDER — VANCOMYCIN HCL 10 G IV SOLR
1250.0000 mg | INTRAVENOUS | Status: DC
  Administered 2016-11-23: 1250 mg via INTRAVENOUS
  Filled 2016-11-23 (×2): qty 1250

## 2016-11-23 MED ORDER — JEVITY 1.2 CAL PO LIQD
1000.0000 mL | ORAL | Status: DC
  Administered 2016-11-23 – 2016-11-24 (×2): 1000 mL

## 2016-11-23 NOTE — ED Notes (Signed)
RT at bedside to suction patient. Thick yellow sputum.

## 2016-11-23 NOTE — Progress Notes (Signed)
Pharmacy Antibiotic Note  Luis AbedKong Ruzich is a 82 y.o. male admitted on 11/23/2016 with aspiration pneumonia.  Pharmacy has been consulted for vancomycin/Zosyn dosing. Pt received vancomycin 1 g IV x1 at 1504 in the ED and cefepime 2 g IV x1.   Plan: Will start vancomycin with stacked dosing at Vancomycin 1250 mg IV every 18 hours.  Goal trough 15-20 mcg/mL. Zosyn 3.375g IV q8h (4 hour infusion).   Ke 0.050, half life 13.9 h, Vd 57.1 L Trough before 4th dose of regimen.   Height: 5\' 7"  (170.2 cm) Weight: 180 lb (81.6 kg) IBW/kg (Calculated) : 66.1  Temp (24hrs), Avg:102 F (38.9 C), Min:102 F (38.9 C), Max:102 F (38.9 C)   Recent Labs Lab 11/17/16 0523 11/18/16 0559 11/19/16 0507 11/20/16 0525 11/23/16 1432 11/23/16 1439  WBC 6.8 6.2  --  5.9  --  9.3  CREATININE 0.59* 0.67 0.64 0.61  --  0.86  LATICACIDVEN  --   --   --   --  1.8  --     Estimated Creatinine Clearance: 60.7 mL/min (by C-G formula based on SCr of 0.86 mg/dL).    No Known Allergies  Antimicrobials this admission: Vancomycin 12/11 >> Cefepime 12/11 x1 dose Zosyn 12/11 >>  Dose adjustments this admission:   Microbiology results: 12/11 BCx: sent 12/11 UCx: sent    Thank you for allowing pharmacy to be a part of this patient's care.  Marty HeckWang, Blessyn [place] L 11/23/2016 5:10 PM

## 2016-11-23 NOTE — ED Notes (Signed)
Attempted to call report

## 2016-11-23 NOTE — ED Provider Notes (Signed)
[facility] Emergency Department Provider Note  ____________________________________________   First MD Initiated Contact with Patient 11/23/16 1435     (approximate)  I have reviewed the triage vital signs and the nursing notes.   HISTORY  Chief Complaint Pneumonia   HPI Sean [name] is a 82 y.o. male with a history of CVA who is presenting with increased secretions as well as altered mental status. The patient has a tracheostomy and has been treated with ceftriaxone over the past several days for pneumonia. He was brought into the emergency department today for worsening symptoms as well as mental status. He has had increased secretions from his trach.He was also reported to have a fever.  No new weakness reported.   Past Medical History:  Diagnosis Date  . Atrial fibrillation (HCC)   . Difficult intubation    "doctors say he has a little throat" (11/03/2016)  . History of kidney stones    "removed it; long time ago; don't know how" (11/03/2016)  . Hyperlipidemia   . Hypertension     Patient Active Problem List   Diagnosis Date Noted  . Sepsis (HCC) 11/23/2016  . Stroke (HCC)   . 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 Acute pulmonary edema (HCC)   . Chronic atrial fibrillation (HCC)   . Other hyperlipidemia   . Oral phase dysphagia   . Prolonged QT interval   . Goals of care, counseling/discussion   . Palliative care by specialist   . Advance care planning   . VAP (ventilator-associated pneumonia) (HCC)   . Cerebrovascular accident (CVA) (HCC)   . Bleeding   . Pressure injury of skin 11/10/2016  . Tracheostomy care (HCC)   . 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 Essential hypertension   . HCAP (healthcare-associated pneumonia)   . Acute respiratory failure with hypoxia (HCC)   . Cerebrovascular accident (CVA) due to embolism of right carotid artery (HCC)   .  Encounter for intubation   . Chronic a-fib (HCC)   . Dysphagia   . Acute blood loss anemia   . Thrombocytopenia (HCC)   . Benign essential HTN   . Bradycardia   . Pain   . Ventilator dependence (HCC)   . Cerebral embolism with cerebral infarction 10/23/2016  . Cerebrovascular accident (CVA) due to thrombosis of precerebral artery (HCC) 10/23/2016    Past Surgical History:  Procedure Laterality Date  . CATARACT EXTRACTION, BILATERAL Bilateral   . ESOPHAGOGASTRODUODENOSCOPY N/A 10/29/2016   Procedure: ESOPHAGOGASTRODUODENOSCOPY (EGD);  Surgeon: Violeta GelinasBurke Thompson, MD;  [facility];  Service: General;  Laterality: N/A;  . IR GENERIC HISTORICAL  10/23/2016   IR ANGIO VERTEBRAL SEL SUBCLAVIAN INNOMINATE UNI R MOD SED 10/23/2016 Julieanne CottonSanjeev Deveshwar, MD [facility]-INTERV RAD  . IR GENERIC HISTORICAL  10/23/2016   IR PERCUTANEOUS ART THROMBECTOMY/INFUSION INTRACRANIAL INC DIAG ANGIO 10/23/2016 Julieanne CottonSanjeev Deveshwar, MD [facility]  . IR GENERIC HISTORICAL  10/23/2016   IR ANGIO INTRA EXTRACRAN SEL COM CAROTID INNOMINATE UNI L MOD SED 10/23/2016 Julieanne CottonSanjeev Deveshwar, MD [facility]  . PEG PLACEMENT N/A 10/29/2016   Procedure: PERCUTANEOUS ENDOSCOPIC GASTROSTOMY (PEG) PLACEMENT;  Surgeon: Violeta GelinasBurke Thompson, MD;  [facility];  Service: General;  Laterality: N/A;  . RADIOLOGY WITH ANESTHESIA N/A 10/23/2016   Procedure: RADIOLOGY WITH ANESTHESIA;  Surgeon: Julieanne CottonSanjeev Deveshwar, MD;  Location: [facility] OR;  Service: Radiology;  Laterality: N/A;  . TRACHEOSTOMY      Prior to Admission  medications   Medication Sig Start Date End Date Taking? Authorizing Provider  Amino Acids-Protein Hydrolys (FEEDING SUPPLEMENT, PRO-STAT SUGAR FREE 64,) LIQD Place 30 mLs into feeding tube 3 (three) times daily. 11/20/16  Yes Clydia Llano, MD  apixaban (ELIQUIS) 5 MG TABS tablet Place 1 tablet (5 mg total) into feeding tube 2 (two) times daily. 11/20/16  Yes Clydia Llano, MD  bacitracin-polymyxin b (POLYSPORIN) ophthalmic  ointment Place into the right eye at bedtime. apply to eye every 12 hours while awake 11/20/16  Yes Clydia Llano, MD  bisacodyl (DULCOLAX) 10 MG suppository Place 1 suppository (10 mg total) rectally daily as needed for moderate constipation. 11/20/16  Yes Clydia Llano, MD  cefTRIAXone (ROCEPHIN) 1 g injection Inject 1 g into the muscle once. Inject 1g intramuscularly every night shift   Yes Historical Provider, MD  ciprofloxacin (CILOXAN) 0.3 % ophthalmic solution Place 2 drops into the right eye 3 (three) times daily before meals. Administer 1 drop, every 2 hours, while awake, for 2 days. Then 1 drop, every 4 hours, while awake, for the next 5 days. Patient taking differently: Place 2 drops into the right eye 3 (three) times daily before meals.  11/20/16  Yes Clydia Llano, MD  furosemide (LASIX) 20 MG tablet Take 1 tablet (20 mg total) by mouth daily. 11/20/16  Yes Clydia Llano, MD  hydrALAZINE (APRESOLINE) 50 MG tablet Place 1 tablet (50 mg total) into feeding tube every 8 (eight) hours. Patient taking differently: Place 50 mg into feeding tube 3 (three) times daily.  11/20/16  Yes Clydia Llano, MD  ipratropium-albuterol (DUONEB) 0.5-2.5 (3) MG/3ML SOLN Take 3 mLs by nebulization every 4 (four) hours.   Yes Historical Provider, MD  Nutritional Supplements (FEEDING SUPPLEMENT, JEVITY 1.2 CAL,) LIQD Place 1,000 mLs into feeding tube continuous. 11/20/16  Yes Clydia Llano, MD  pantoprazole sodium (PROTONIX) 40 mg/20 mL PACK Place 20 mLs (40 mg total) into feeding tube daily. 11/21/16  Yes Clydia Llano, MD  TUBERCULIN PPD ID Inject 0.1 mLs into the skin every 7 (seven) days.   Yes Historical Provider, MD  Water For Irrigation, Sterile (FREE WATER) SOLN Place 100 mLs into feeding tube every 8 (eight) hours. 11/20/16  Yes Clydia Llano, MD    Allergies Patient has no known allergies.  No family history on file.  Social History Social History  Substance Use Topics  . Smoking status: Former Smoker    Quit  date: 10/24/2001  . Smokeless tobacco: Never Used  . Alcohol use Not on file    Review of Systems Level V caveat secondary to patient nonverbal.  ____________________________________________   PHYSICAL EXAM:  VITAL SIGNS: ED Triage Vitals  Enc Vitals Group     BP 11/23/16 1430 133/90     Pulse Rate 11/23/16 1430 99     Resp 11/23/16 1430 (!) 21     Temp 11/23/16 1439 (!) 102 F (38.9 C)     Temp Source 11/23/16 1439 Rectal     SpO2 11/23/16 1430 93 %     Weight 11/23/16 1430 180 lb (81.6 kg)     Height 11/23/16 1430 5\' 7"  (1.702 m)     Head Circumference --      Peak Flow --      Pain Score --      Pain Loc --      Pain Edu? --      Excl. in GC? --     Constitutional: Alert. in no acute distress. Eyes: Conjunctivae are  normal. Head: Atraumatic. Nose: No congestion/rhinnorhea. Mouth/Throat: Mucous membranes are moist.   Neck: No stridor.  Tracheostomy with thick yellow secretions requiring suction by respiratory. Cardiovascular: Normal rate, regular rhythm. Grossly normal heart sounds.   Respiratory: Normal respiratory effort.  No retractions. Lungs CTAB. Gastrointestinal: Soft and nontender. No distention.  No CVA tenderness. Musculoskeletal: No lower extremity tenderness nor edema.  No joint effusions. Neurologic:  Left-sided hemiparesis. Skin:  Skin is warm, dry and intact. No rash noted.   ____________________________________________   LABS (all labs ordered are listed, but only abnormal results are displayed)  Labs Reviewed  COMPREHENSIVE METABOLIC PANEL - Abnormal; Notable for the following:       Result Value   Glucose, Bld 198 (*)    BUN 31 (*)    Calcium 8.2 (*)    Albumin 2.7 (*)    All other components within normal limits  CBC WITH DIFFERENTIAL/PLATELET - Abnormal; Notable for the following:    RBC 3.73 (*)    Hemoglobin 10.8 (*)    HCT 32.2 (*)    RDW 16.7 (*)    Neutro Abs 6.7 (*)    All other components within normal limits  URINALYSIS,  COMPLETE (UACMP) WITH MICROSCOPIC - Abnormal; Notable for the following:    Color, Urine YELLOW (*)    APPearance CLEAR (*)    Protein, ur 30 (*)    Squamous Epithelial / LPF 0-5 (*)    All other components within normal limits  CULTURE, BLOOD (ROUTINE X 2)  CULTURE, BLOOD (ROUTINE X 2)  URINE CULTURE  LACTIC ACID, PLASMA  LACTIC ACID, PLASMA   ____________________________________________  EKG  ED ECG REPORT I, Destani Wamser,  Teena Iraniavid M, the attending physician, personally viewed and interpreted this ECG.   Date: 11/23/2016  EKG Time: 1430  Rate: 97  Rhythm: atrial fibrillation  Axis: Normal axis  Intervals:none  ST&T Change: No ST segment elevation or depression. No abnormal T-wave inversion.  ____________________________________________  RADIOLOGY  Pending chest x-ray. ____________________________________________   PROCEDURES  Procedure(s) performed:   Procedures  Critical Care performed:   ____________________________________________   INITIAL IMPRESSION / ASSESSMENT AND PLAN / ED COURSE  Pertinent labs & imaging results that were available during my care of the patient were reviewed by me and considered in my medical decision making (see chart for details).    Clinical Course   ----------------------------------------- 245 PM on 11/23/2016 ----------------------------------------- Discussed case with Dr. Tildon HuskyModi who will be admitting the patient to the hospital.    ____________________________________________   FINAL CLINICAL IMPRESSION(S) / ED DIAGNOSES  HCAP.      NEW MEDICATIONS STARTED DURING THIS VISIT:  New Prescriptions   No medications on file     Note:  This document was prepared using Dragon voice recognition software and may include unintentional dictation errors.    Myrna Blazeravid Matthew Eman Morimoto, MD 11/23/16 [phone]

## 2016-11-23 NOTE — ED Triage Notes (Signed)
Pt arrives to ER via [facility] from rehab. Pt being treated for pneumonia at this time. Thick yellow mucous production through trach. Pt family wanted patient treated due to "decompensation" in health.

## 2016-11-23 NOTE — H&P (Addendum)
[facility] [place] [facility]   PATIENT NAME: Sean [name]    MR#:  [id number]  DATE OF BIRTH:  1926/07/30  DATE OF ADMISSION:  11/23/2016  PRIMARY CARE PHYSICIAN: No primary care provider on file.   REQUESTING/REFERRING PHYSICIAN: Dr Langston Masker CHIEF COMPLAINT:  Decompensation in health HISTORY OF PRESENT ILLNESS:  Sean [name]  is a 82 y.o. male who was recently discharged from [facility] after prolonged hospital stay with Right ICA occlusion/MCA CVA status post thrombectomy with residual paralysis of left side, unresponsiveness, tracheostomy and PEG tube with dysphasia who presents from [place] [facility] with decompensation in health. Patient has had thick sputum and family wanted patient to be evaluated in the emergency room. In the emergency room he was noted have a temperature of 102. He has been suctioned by RT with thick sputum. He is currently being treated for pneumonia on ciprofloxacin. Patient is completely unresponsive at this time.  PAST MEDICAL HISTORY:   Past Medical History:  Diagnosis Date  . Atrial fibrillation (HCC)   . Difficult intubation    "doctors say he has a little throat" (11/03/2016)  . History of kidney stones    "removed it; long time ago; don't know how" (11/03/2016)  . Hyperlipidemia   . Hypertension     PAST SURGICAL HISTORY:   Past Surgical History:  Procedure Laterality Date  . CATARACT EXTRACTION, BILATERAL Bilateral   . ESOPHAGOGASTRODUODENOSCOPY N/A 10/29/2016   Procedure: ESOPHAGOGASTRODUODENOSCOPY (EGD);  Surgeon: Violeta Gelinas, MD;  Location: [facility] ENDOSCOPY;  Service: General;  Laterality: N/A;  . IR GENERIC HISTORICAL  10/23/2016   IR ANGIO VERTEBRAL SEL SUBCLAVIAN INNOMINATE UNI R MOD SED 10/23/2016 Julieanne Cotton, MD [facility]INTER[facility]  . IR GENERIC HISTORICAL  10/23/2016   IR PERCUTANEOUS ART THROMBECTOMY/INFUSION INTRACRANIAL INC DIAG ANGIO 10/23/2016 Julieanne Cotton, MD [facility]  . IR GENERIC  HISTORICAL  10/23/2016   IR ANGIO INTRA EXTRACRAN SEL COM CAROTID INNOMINATE UNI L MOD SED 10/23/2016 Julieanne Cotton, MD [facility]  . PEG PLACEMENT N/A 10/29/2016   Procedure: PERCUTANEOUS ENDOSCOPIC GASTROSTOMY (PEG) PLACEMENT;  Surgeon: Violeta Gelinas, MD;  Location: [facility] ENDOSCOPY;  Service: General;  Laterality: N/A;  . RADIOLOGY WITH ANESTHESIA N/A 10/23/2016   Procedure: RADIOLOGY WITH ANESTHESIA;  Surgeon: Julieanne Cotton, MD;  Location: [facility] OR;  Service: Radiology;  Laterality: N/A;  . TRACHEOSTOMY      SOCIAL HISTORY:   Social History  Substance Use Topics  . Smoking status: Former Smoker    Quit date: 10/24/2001  . Smokeless tobacco: Never Used  . Alcohol use Not on file    FAMILY HISTORY:  No family history on file.  DRUG ALLERGIES:  No Known Allergies  REVIEW OF SYSTEMS:   Review of Systems  Unable to perform ROS: Patient unresponsive    MEDICATIONS AT HOME:   Prior to Admission medications   Medication Sig Start Date End Date Taking? Authorizing Provider  Amino Acids-Protein Hydrolys (FEEDING SUPPLEMENT, PRO-STAT SUGAR FREE 64,) LIQD Place 30 mLs into feeding tube 3 (three) times daily. 11/20/16   Clydia Llano, MD  apixaban (ELIQUIS) 5 MG TABS tablet Place 1 tablet (5 mg total) into feeding tube 2 (two) times daily. 11/20/16   Clydia Llano, MD  bacitracin-polymyxin b (POLYSPORIN) ophthalmic ointment Place into the right eye at bedtime. apply to eye every 12 hours while awake 11/20/16   Clydia Llano, MD  bisacodyl (DULCOLAX) 10 MG suppository Place 1 suppository (10 mg total) rectally daily as needed for moderate  constipation. 11/20/16   Clydia LlanoMutaz Elmahi, MD  ciprofloxacin (CILOXAN) 0.3 % ophthalmic solution Place 2 drops into the right eye 3 (three) times daily before meals. Administer 1 drop, every 2 hours, while awake, for 2 days. Then 1 drop, every 4 hours, while awake, for the next 5 days. 11/20/16   Clydia LlanoMutaz Elmahi, MD  furosemide (LASIX) 20 MG tablet Take 1 tablet  (20 mg total) by mouth daily. 11/20/16   Clydia LlanoMutaz Elmahi, MD  hydrALAZINE (APRESOLINE) 50 MG tablet Place 1 tablet (50 mg total) into feeding tube every 8 (eight) hours. 11/20/16   Clydia LlanoMutaz Elmahi, MD  Nutritional Supplements (FEEDING SUPPLEMENT, JEVITY 1.2 CAL,) LIQD Place 1,000 mLs into feeding tube continuous. 11/20/16   Clydia LlanoMutaz Elmahi, MD  pantoprazole sodium (PROTONIX) 40 mg/20 mL PACK Place 20 mLs (40 mg total) into feeding tube daily. 11/21/16   Clydia LlanoMutaz Elmahi, MD  Water For Irrigation, Sterile (FREE WATER) SOLN Place 100 mLs into feeding tube every 8 (eight) hours. 11/20/16   Clydia LlanoMutaz Elmahi, MD      VITAL SIGNS:  Blood pressure 133/90, pulse 99, temperature (!) 102 F (38.9 C), temperature source Rectal, resp. rate (!) 21, height 5\' 7"  (1.702 m), weight 81.6 kg (180 lb), SpO2 93 %.  PHYSICAL EXAMINATION:   Physical Exam  Constitutional: No distress.  Chronically ill-appearing unresponsive  HENT:  Head: Normocephalic.  Eyes: No scleral icterus.  Neck: No JVD present. No tracheal deviation present.  Tracheostomy  Cardiovascular: Normal rate, regular rhythm and normal heart sounds.  Exam reveals no gallop and no friction rub.   No murmur heard. Pulmonary/Chest: No respiratory distress. He has no wheezes. He has no rales. He exhibits no tenderness.  Bilateral rhonchi Please work of breathing  Abdominal: Soft. Bowel sounds are normal. He exhibits no distension and no mass. There is no tenderness. There is no rebound and no guarding.  PEG tube placed  Musculoskeletal: He exhibits no edema.  Neurological:  Unresponsive with left-sided paralysis  Skin: Skin is warm. No rash noted. There is erythema.  Sacral pressure ulcer      LABORATORY PANEL:   CBC  Recent Labs Lab 11/23/16 1439  WBC 9.3  HGB 10.8*  HCT 32.2*  PLT 197   ------------------------------------------------------------------------------------------------------------------  Chemistries   Recent Labs Lab  11/17/16 0523  11/23/16 1439  NA 134*  < > 142  K 4.0  < > 4.2  CL 101  < > 105  CO2 24  < > 29  GLUCOSE 123*  < > 198*  BUN 10  < > 31*  CREATININE 0.59*  < > 0.86  CALCIUM 8.0*  < > 8.2*  MG 2.1  --   --   AST  --   --  35  ALT  --   --  25  ALKPHOS  --   --  66  BILITOT  --   --  0.9  < > = values in this interval not displayed. ------------------------------------------------------------------------------------------------------------------  Cardiac Enzymes No results for input(s): TROPONINI in the last 168 hours. ------------------------------------------------------------------------------------------------------------------  RADIOLOGY:  No results found.  EKG:  Atrial fibrillation no ST elevation or depression  IMPRESSION AND PLAN:   82 year old male who [facility] month hospitalization with multiple complications and now a tracheostomy and PEG tube who presents with sepsis due to aspiration pneumonia  1. Sepsis with fever and increased respiratory rate on admission due to aspiration pneumonia Start vancomycin and Zosyn  MRSA  PCR pending Start IV fluids  2. Aspiration pneumonia: Dietary consultation for tube feedings Aspiration precautions It should be noted patient due to his CVA and left-sided process and unresponsiveness has high risk of chronic aspiration.  3. Chronic atrial fibrillation: Patient is on Eliquis  4. Dysphagia: Patient has PEG and will need dietary consult Abdominal binder in place 5. 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 Continue current anticoagulation  6. Sacral decubitus ulcer: Wound care consult placed. Patient has very poor prognosis. Attempted to call patient's family to discuss overall prognosis and goals of care however phone number in chart is incorrect. I will  await for them to arrive in the emergency room.   All the records are reviewed and case discussed with ED provider. CODE STATUS: FULL  TOTAL TIME TAKING CARE OF THIS PATIENT: 45 minutes.    Herley Bernardini M.D on 11/23/2016 at 3:15 PM  Between 7am to 6pm - Pager - [phone]  After 6pm go to [email or web address] - Social research officer, governmentpassword [email or web address]  [facility] [place] [facility]  Office  [phone]  CC: Primary care physician; No primary care provider on file.

## 2016-11-23 NOTE — Progress Notes (Signed)
Family Meeting Note  Advance Directive:no  Today a meeting took place with the spouse.and son  Patient is unable to participate due ZO:[id number]:Lacked capacity Sedated   The following clinical team members were present during this meeting:MD  The following were discussed:Patient's diagnosis sepsis with recent CVA s/p thrombectomy and left sided paralysis/tracheostomy and PEG  Patient's progosis: < 6 months and Goals for treatment: Full Code  Additional follow-up to be provided: Family would like to continue full CODE STATUS. They are receptable to palliative care consult.  Time spent during discussion:30 minutes  Sean Olden, MD

## 2016-11-23 NOTE — ED Notes (Signed)
Pts son [name]'s phone number:   [phone]

## 2016-11-24 DIAGNOSIS — J189 Pneumonia, unspecified organism: Secondary | ICD-10-CM

## 2016-11-24 DIAGNOSIS — Z7189 Other specified counseling: Secondary | ICD-10-CM

## 2016-11-24 DIAGNOSIS — Z515 Encounter for palliative care: Secondary | ICD-10-CM

## 2016-11-24 DIAGNOSIS — E440 Moderate protein-calorie malnutrition: Secondary | ICD-10-CM | POA: Insufficient documentation

## 2016-11-24 LAB — BASIC METABOLIC PANEL
ANION GAP: 6 (ref 5–15)
BUN: 27 mg/dL — ABNORMAL HIGH (ref 6–20)
CO2: 27 mmol/L (ref 22–32)
Calcium: 7.8 mg/dL — ABNORMAL LOW (ref 8.9–10.3)
Chloride: 109 mmol/L (ref 101–111)
Creatinine, Ser: 0.66 mg/dL (ref 0.61–1.24)
GFR calc Af Amer: >60 mL/min (ref >60)
Glucose, Bld: 223 mg/dL — ABNORMAL HIGH (ref 65–99)
POTASSIUM: 3.4 mmol/L — AB (ref 3.5–5.1)
SODIUM: 142 mmol/L (ref 135–145)

## 2016-11-24 LAB — URINE CULTURE: Culture: NO GROWTH

## 2016-11-24 LAB — CBC
HEMATOCRIT: 29.9 % — AB (ref 40.0–52.0)
HEMOGLOBIN: 9.9 g/dL — AB (ref 13.0–18.0)
MCH: 29.3 pg (ref 26.0–34.0)
MCHC: 33.2 g/dL (ref 32.0–36.0)
MCV: 88.3 fL (ref 80.0–100.0)
Platelets: 161 10*3/uL (ref 150–440)
RBC: 3.38 MIL/uL — ABNORMAL LOW (ref 4.40–5.90)
RDW: 16.4 % — ABNORMAL HIGH (ref 11.5–14.5)
WBC: 6.7 10*3/uL (ref 3.8–10.6)

## 2016-11-24 LAB — GLUCOSE, CAPILLARY
GLUCOSE-CAPILLARY: 204 mg/dL — AB (ref 65–99)
GLUCOSE-CAPILLARY: 191 mg/dL — AB (ref 65–99)

## 2016-11-24 MED ORDER — PRO-STAT SUGAR FREE PO LIQD
30.0000 mL | Freq: Every day | ORAL | Status: DC
  Administered 2016-11-24 – 2016-11-27 (×4): 30 mL

## 2016-11-24 MED ORDER — COLLAGENASE 250 UNIT/GM EX OINT
TOPICAL_OINTMENT | Freq: Every day | CUTANEOUS | Status: DC
  Administered 2016-11-24 – 2016-11-27 (×7): via TOPICAL
  Filled 2016-11-24: qty 30

## 2016-11-24 MED ORDER — JEVITY 1.2 CAL PO LIQD
1000.0000 mL | ORAL | Status: DC
  Administered 2016-11-24 – 2016-11-27 (×4): 1000 mL

## 2016-11-24 MED ORDER — POTASSIUM CHLORIDE 20 MEQ/15ML (10%) PO SOLN
40.0000 meq | Freq: Once | ORAL | Status: AC
  Administered 2016-11-24: 18:00:00 40 meq via ORAL
  Filled 2016-11-24: qty 30

## 2016-11-24 MED ORDER — INSULIN ASPART 100 UNIT/ML ~~LOC~~ SOLN
0.0000 [IU] | SUBCUTANEOUS | Status: DC
  Administered 2016-11-24 – 2016-11-26 (×9): 2 [IU] via SUBCUTANEOUS
  Administered 2016-11-26: 1 [IU] via SUBCUTANEOUS
  Administered 2016-11-26 (×2): 2 [IU] via SUBCUTANEOUS
  Administered 2016-11-26: 1 [IU] via SUBCUTANEOUS
  Administered 2016-11-27 (×4): 2 [IU] via SUBCUTANEOUS
  Filled 2016-11-24: qty 2
  Filled 2016-11-24: qty 1
  Filled 2016-11-24 (×12): qty 2
  Filled 2016-11-24: qty 1
  Filled 2016-11-24 (×2): qty 2

## 2016-11-24 NOTE — Progress Notes (Signed)
SNF, Non-Emergent EMS Transport, and Home Health Benefits:  Number called: [phone] Rep: Sandie Ano.  Reference Number: No reference number.    [employer] HMO plan effective since 05/15/15 with no deductible.  Out of pocket max is $6700, of which $0 met so far. Patient's plan does not offer any out of network benefits.  No facilities in [place] in-network with plan, including [facility].   In-network SNF: $290 copay per day for days 1-6 and $0 copay for days 7 - 60.  Limited to 60 days per calendar year.  Josem Kaufmann is required: [phone].    Non-emergent EMS transport: $200 copay for each approved one way medically necessary, Medicare covered trip.  Josem Kaufmann is required: [phone].   In-Network Home Health: $0 copay for services.  Josem Kaufmann is required: [phone].     For [facility] to be considered non-par, rep gave fax number of [phone].  Also stated to put to Pat's attention.

## 2016-11-24 NOTE — Progress Notes (Signed)
[facility] [place] [facility]   PATIENT NAME: Luis AbedKong Pepperman    MR#:  [id number]  DATE OF BIRTH:  1926/01/23  SUBJECTIVE:  CHIEF COMPLAINT:   Chief Complaint  Patient presents with  . Pneumonia    REVIEW OF SYSTEMS:  Review of Systems  Unable to perform ROS: Critical illness    DRUG ALLERGIES:  No Known Allergies VITALS:  Blood pressure 120/80, pulse 81, temperature 99.6 F (37.6 C), temperature source Oral, resp. rate 18, height 5\' 7"  (1.702 m), weight 78.8 kg (173 lb 12.8 oz), SpO2 100 %. PHYSICAL EXAMINATION:  Physical Exam  Constitutional: No distress.  Chronically ill-appearing unresponsive  HENT:  Head: Normocephalic.  Eyes: No scleral icterus.  Neck: No JVD present. No tracheal deviation present.  Tracheostomy  Cardiovascular: Normal rate, regular rhythm and normal heart sounds.  Exam reveals no gallop and no friction rub.   No murmur heard. Pulmonary/Chest: No respiratory distress. He has no wheezes. He has no rales. He exhibits no tenderness.  Bilateral rhonchi  increase work of breathing  Abdominal: Soft. Bowel sounds are normal. He exhibits no distension and no mass. There is no tenderness. There is no rebound and no guarding.  PEG tube placed  Musculoskeletal: He exhibits no edema.  Neurological:  Unresponsive with left-sided paralysis  Skin: Skin is warm. No rash noted. There is erythema.  Sacral pressure ulcer  LABORATORY PANEL:   CBC  Recent Labs Lab 11/24/16 0428  WBC 6.7  HGB 9.9*  HCT 29.9*  PLT 161   ------------------------------------------------------------------------------------------------------------------ Chemistries   Recent Labs Lab 11/23/16 1439 11/24/16 0428  NA 142 142  K 4.2 3.4*  CL 105 109  CO2 29 27  GLUCOSE 198* 223*  BUN 31* 27*  CREATININE 0.86 0.66  CALCIUM 8.2* 7.8*  AST 35  --   ALT 25  --   ALKPHOS 66  --   BILITOT 0.9  --    RADIOLOGY:  No results found. ASSESSMENT AND  PLAN:  82 year old male who is [facility] on December 8 [facility] after 1 month hospitalization with multiple complications and now a tracheostomy and PEG tube who presents with sepsis due to aspiration pneumonia  1. Sepsis: Present on admission with fever and increased respiratory rate on admission due to aspiration pneumonia Stop vancomycin and continue Zosyn  MRSA PCR negative - continue IV fluids  2. Aspiration pneumonia: Dietary consultation for tube feedings Aspiration precautions It should be noted patient due to his CVA and left-sided process and unresponsiveness has high risk of chronic aspiration.  3. Chronic atrial fibrillation: Rate is controlled, continue Eliquis  4. Dysphagia: Patient has PEG and dietary following Abdominal binder in place  5. 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 Continue current anticoagulation  6. Sacral decubitus ulcer: Wound care input apprciated Patient has very poor prognosis.  7.  Moderate protein calorie malnutrition  8.  Hypokalemia - Replete and recheck   All the records are reviewed and case discussed with Care Management/Social Worker. Management plans discussed with the patient, family and they are in agreement.  CODE STATUS: Full code  TOTAL TIME TAKING CARE OF THIS PATIENT: 35 minutes.   More than 50% of the time was spent in counseling/coordination of care: YES  POSSIBLE D/C IN 2-3 DAYS, DEPENDING ON CLINICAL CONDITION.   Delfino LovettVipul Esias Mory M.D on 11/24/2016 at 5:24 PM  Between 7am to 6pm - Pager - [phone]  After 6pm go to [email or web address] - Scientist, research (life sciences)password [email or web address]  [facility] [place] [facility]  Office  [phone]  CC: Primary care physician; No PCP Per Patient  Note: This dictation was prepared with Dragon dictation along with smaller phrase technology. Any transcriptional  errors that result from this process are unintentional.

## 2016-11-24 NOTE — Care Management Important Message (Signed)
Important Message  Patient Details  Name: Luis AbedKong Strohmeyer MRN: [id number] Date of Birth: 07/31/1926   Medicare Important Message Given:  Yes    Gwenette GreetBrenda S Rachel Samples, RN 11/24/2016, 1:50 PM

## 2016-11-24 NOTE — Progress Notes (Signed)
Inpatient Diabetes Program Recommendations  AACE/ADA: New Consensus Statement on Inpatient Glycemic Control (2015)  Target Ranges:  Prepandial:   less than 140 mg/dL      Peak postprandial:   less than 180 mg/dL (1-2 hours)      Critically ill patients:  140 - 180 mg/dL   Results for Luis AbedONG, Shaughn (MRN [id number]) as of 11/24/2016 12:15  Ref. Range 11/23/2016 14:39 11/24/2016 04:28  Glucose Latest Ref Range: 65 - 99 mg/dL 952198 (H) 841223 (H)   Review of Glycemic Control  Diabetes history: No Outpatient Diabetes medications: NA Current orders for Inpatient glycemic control: None  Inpatient Diabetes Program Recommendations: Correction (SSI): Noted lab glucose of 223 mg/dl this morning. Please consider ordering CBGs and Novolog 0-9 units Q4H.  Thanks, Orlando PennerMarie Iyana Topor, RN, MSN, CDE Diabetes Coordinator Inpatient Diabetes Program [phone] (Team Pager from 8am to 5pm)

## 2016-11-24 NOTE — Plan of Care (Signed)
Problem: Activity: Goal: Ability to tolerate increased activity will improve Outcome: Progressing Turn Q2 hrs. HOB 45 degress for TF.  Problem: Respiratory: Goal: Patent airway maintenance will improve Outcome: Progressing Provided oral care.  Provided trach care.  Suctioned trach. Thick yellow secretions noted. Trach supplies at bedside.   Problem: Role Relationship: Goal: Ability to communicate will improve Outcome: Progressing Unable to verbally communicate related to trach.

## 2016-11-24 NOTE — Consult Note (Signed)
Consultation Note Date: 11/24/2016   Patient Name: Sean [name]  DOB: September 05, 1926  MRN: [id number]  Age / Sex: 82 y.o., male  PCP: No Pcp Per Patient Referring Physician: Max Sane, MD  Reason for Consultation: Establishing goals of care  HPI/Patient Profile: 82 y.o. male  with past medical history of hypertension, hyperlipidemia, atrial fibrillation, and recent stroke admitted on 11/23/2016 with thick sputum and temperature per family. Patient was recently discharged from [facility] after prolonged hospital stay for right ICA occlusion/MCA CVA s/p thrombectomy. Patient with left paralysis. He had tracheostomy and PEG tube placed and discharged to [facility]. In ED, patient septic due to aspiration pneumonia. Vancomycin and Zosyn initiated. Palliative medicine consultation for goals of care.   Clinical Assessment and Goals of Care: Patient originally from [place] and moved to [place] to work in [employer]. Him and his wife have lived in [place] for over 30 years. They were visiting son, Sean [name], in [place] when he had the stroke. One daughter is in [place] and the other adult children live in [place]. Adult children do not all get along. After the severity of the stroke, Sean [name] told his brothers and sisters to come visit their father in [place] and they have not. Patient and wife do not speak [place].  Met with son, Sean [name] and wife outside of room. He briefly explains the last month in the hospital and that his father chose to have a trach and a PEG in order to keep in alive and "not let go." Since trach placed, it is challenging for Sean [name] to communicate but according to [facility], he is of sound mind and understands this will likely be his baseline long term (with trach/peg, bed bound, and in a nursing facility). Discussed in detail aspiration pneumonia, recurrent risk of aspiration  due to PEG, and my concern with this becoming a cycle of re-hospitalization and declining health. Sean [name] and wife seem hopeful that patient may not need the trach long term (they were told a year or two when initially placed). I explained my concern of being nowhere near able to take the trach out, especially if this continues to be a cycle with pneumonia.   Discussed in detail resuscitation, the unlikelihood of him surviving this, and the aggressive intervention being more harm than good to the patient. I discussed this with Sean [name] and also two sisters and brother-in-law via telephone. Although children able to speak English, there is still a knowledge and language gap with the concept of resuscitating the patient. Of all the adult children, Sean [name] seems to be the most realistic and if his father would die, not to attempt resuscitation. I had Sean [name] explain code status to his mother. She is very overwhelmed during the conversation but tells Sean [name] she will talk with the patient about this tonight.   At this point, family is "not giving up hope" and want the "doctors to do the best they can" in order for him to live longer. The most important goal for this family  is to get their father back to [place], if at all possible and if he is stable for transfer. Sean [name] has already been in contact with a long term facility there.   Encouraged Sean [name] to continue conversations with his mother, father, and siblings about these big decisions their family is faced with moving forward. Answered questions and offered my continued support during hospitalization.    SUMMARY OF RECOMMENDATIONS    Spoke in detail with family regarding current medical condition, code status, and goals of care. Patient remains a FULL code. Family understands my recommendation for DNR but wanting more time to process and discuss this information.  If at all possible and patient stable for transfer, family would like him to return to  [place].  PMT will follow-up 12/13 to continue conversations with son regarding code status and goals of care.   Code Status/Advance Care Planning:  Full code   Symptom Management:   Per attending  Palliative Prophylaxis:   Aspiration, Delirium Protocol, Oral Care and Turn Reposition  Additional Recommendations (Limitations, Scope, Preferences):  Full Scope Treatment  Psycho-social/Spiritual:   Desire for further Chaplaincy support:no  Additional Recommendations: Caregiving  Support/Resources  Prognosis:   Unable to determine  Discharge Planning: To Be Determined      Primary Diagnoses: Present on Admission: . Sepsis (HCC)   I have reviewed the medical record, interviewed the patient and family, and examined the patient. The following aspects are pertinent.  Past Medical History:  Diagnosis Date  . Atrial fibrillation (HCC)   . Difficult intubation    "doctors say he has a little throat" (11/03/2016)  . History of kidney stones    "removed it; long time ago; don't know how" (11/03/2016)  . Hyperlipidemia   . Hypertension    Social History   Social History  . Marital status: Married    Spouse name: N/A  . Number of children: N/A  . Years of education: N/A   Social History Main Topics  . Smoking status: Former Smoker    Quit date: 10/24/2001  . Smokeless tobacco: Never Used  . Alcohol use None  . Drug use: Unknown  . Sexual activity: Not Asked   Other Topics Concern  . None   Social History Narrative  . None   No family history on file. Scheduled Meds: . apixaban  5 mg Per Tube BID  . bacitracin-polymyxin b   Right Eye QHS  . collagenase   Topical Daily  . feeding supplement (PRO-STAT SUGAR FREE 64)  30 mL Per Tube Daily  . free water  100 mL Per Tube Q8H  . furosemide  20 mg Oral Daily  . hydrALAZINE  50 mg Per Tube Q8H  . insulin aspart  0-9 Units Subcutaneous Q4H  . pantoprazole sodium  40 mg Per Tube Daily  .  piperacillin-tazobactam (ZOSYN)  IV  3.375 g Intravenous Q8H  . potassium chloride  40 mEq Oral Once   Continuous Infusions: . sodium chloride 75 mL/hr at 11/24/16 0716  . feeding supplement (JEVITY 1.2 CAL) 1,000 mL (11/24/16 1400)   PRN Meds:.acetaminophen **OR** acetaminophen, bisacodyl, ondansetron **OR** ondansetron (ZOFRAN) IV Medications Prior to Admission:  Prior to Admission medications   Medication Sig Start Date End Date Taking? Authorizing Provider  Amino Acids-Protein Hydrolys (FEEDING SUPPLEMENT, PRO-STAT SUGAR FREE 64,) LIQD Place 30 mLs into feeding tube 3 (three) times daily. 11/20/16  Yes Clydia Llano, MD  apixaban (ELIQUIS) 5 MG TABS tablet Place 1 tablet (5 mg total) into feeding  tube 2 (two) times daily. 11/20/16  Yes Verlee Monte, MD  bacitracin-polymyxin b (POLYSPORIN) ophthalmic ointment Place into the right eye at bedtime. apply to eye every 12 hours while awake 11/20/16  Yes Verlee Monte, MD  bisacodyl (DULCOLAX) 10 MG suppository Place 1 suppository (10 mg total) rectally daily as needed for moderate constipation. 11/20/16  Yes Verlee Monte, MD  cefTRIAXone (ROCEPHIN) 1 g injection Inject 1 g into the muscle once. Inject 1g intramuscularly every night shift   Yes Historical Provider, MD  ciprofloxacin (CILOXAN) 0.3 % ophthalmic solution Place 2 drops into the right eye 3 (three) times daily before meals. Administer 1 drop, every 2 hours, while awake, for 2 days. Then 1 drop, every 4 hours, while awake, for the next 5 days. Patient taking differently: Place 2 drops into the right eye 3 (three) times daily before meals.  11/20/16  Yes Verlee Monte, MD  furosemide (LASIX) 20 MG tablet Take 1 tablet (20 mg total) by mouth daily. 11/20/16  Yes Verlee Monte, MD  hydrALAZINE (APRESOLINE) 50 MG tablet Place 1 tablet (50 mg total) into feeding tube every 8 (eight) hours. Patient taking differently: Place 50 mg into feeding tube 3 (three) times daily.  11/20/16  Yes Verlee Monte, MD    ipratropium-albuterol (DUONEB) 0.5-2.5 (3) MG/3ML SOLN Take 3 mLs by nebulization every 4 (four) hours.   Yes Historical Provider, MD  Nutritional Supplements (FEEDING SUPPLEMENT, JEVITY 1.2 CAL,) LIQD Place 1,000 mLs into feeding tube continuous. 11/20/16  Yes Verlee Monte, MD  pantoprazole sodium (PROTONIX) 40 mg/20 mL PACK Place 20 mLs (40 mg total) into feeding tube daily. 11/21/16  Yes Verlee Monte, MD  TUBERCULIN PPD ID Inject 0.1 mLs into the skin every 7 (seven) days.   Yes Historical Provider, MD  Water For Irrigation, Sterile (FREE WATER) SOLN Place 100 mLs into feeding tube every 8 (eight) hours. 11/20/16  Yes Verlee Monte, MD   No Known Allergies Review of Systems  Unable to perform ROS: Acuity of condition   Physical Exam  Constitutional: He is easily aroused. He appears ill.  Cardiovascular: Regular rhythm and normal heart sounds.   Pulmonary/Chest: No accessory muscle usage. He has rhonchi.  Trach/trach collar 28%  Abdominal: Soft. Bowel sounds are decreased.  PEG  Musculoskeletal: He exhibits edema (generalized, LUE).  Neurological: He is easily aroused.  Skin: Skin is warm and dry. There is pallor.  Psychiatric: Cognition and memory are impaired. He is inattentive.  Nursing note and vitals reviewed.  Vital Signs: BP 120/80   Pulse 81   Temp 99.6 F (37.6 C) (Oral)   Resp 18   Ht 5' 7" (1.702 m)   Wt 78.8 kg (173 lb 12.8 oz) Comment: Bed scale  SpO2 100%   BMI 27.22 kg/m  Pain Assessment: PAINAD     SpO2: SpO2: 100 % O2 Device:SpO2: 100 % O2 Flow Rate: .O2 Flow Rate (L/min): 5 L/min  IO: Intake/output summary:   Intake/Output Summary (Last 24 hours) at 11/24/16 1653 Last data filed at 11/24/16 0536  Gross per 24 hour  Intake          1656.17 ml  Output                0 ml  Net          1656.17 ml    LBM: Last BM Date: 11/24/16 Baseline Weight: Weight: 81.6 kg (180 lb) Most recent weight: Weight: 78.8 kg (173 lb 12.8 oz) (Bed scale)  Palliative  Assessment/Data: PPS 10%   Flowsheet Rows   Flowsheet Row Most Recent Value  Intake Tab  Referral Department  Hospitalist  Unit at Time of Referral  Oncology Unit  Palliative Care Primary Diagnosis  Sepsis/Infectious Disease  Date Notified  11/23/16  Palliative Care Type  Return patient Palliative Care  Reason for referral  Clarify Goals of Care  Date of Admission  11/23/16  Date first seen by Palliative Care  11/24/16  # of days Palliative referral response time  1 Day(s)  # of days IP prior to Palliative referral  0  Clinical Assessment  Palliative Performance Scale Score  10%  Psychosocial & Spiritual Assessment  Palliative Care Outcomes  Patient/Family meeting held?  Yes  Who was at the meeting?  patient, wife, son Sean [name]), and spoke with multiple family members over the phone.   Palliative Care Outcomes  Clarified goals of care, Provided psychosocial or spiritual support, Provided advance care planning      Time In/Out: 1245-1345, 1545-1630 Time Total: 164mn Greater than 50%  of this time was spent counseling and coordinating care related to the above assessment and plan.  Signed by:  MIhor Dow FNP-C Palliative Medicine Team  Phone: [phone]: [phone]  Please contact Palliative Medicine Team phone at [phone] questions and concerns.  For individual provider: See AShea Evans

## 2016-11-24 NOTE — [place] FL2 (Signed)
MEDICAID FL2 LEVEL OF CARE SCREENING TOOL     IDENTIFICATION  Patient Name: Luis AbedKong Gulbranson Birthdate: 09-Jul-1926 Sex: male Admission Date (Current Location): 11/23/2016  [place]   Facility and Address:  [facility], [address], [place], [place] [address]      Provider Number: [id number]  Attending Physician Name and Address:  Delfino LovettVipul Shah, MD  Relative Name and Phone Number:       Current Level of Care: Hospital Recommended Level of Care: Skilled Nursing Facility Prior Approval Number:    Date Approved/Denied:   PASRR Number:  ( [id number] )  Discharge Plan: SNF    Current Diagnoses: Patient Active Problem List   Diagnosis Date Noted  . Sepsis (HCC) 11/23/2016  . Stroke (HCC)   . 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 Acute pulmonary edema (HCC)   . Chronic atrial fibrillation (HCC)   . Other hyperlipidemia   . Oral phase dysphagia   . Prolonged QT interval   . Goals of care, counseling/discussion   . Palliative care by specialist   . Advance care planning   . VAP (ventilator-associated pneumonia) (HCC)   . Cerebrovascular accident (CVA) (HCC)   . Bleeding   . Pressure injury of skin 11/10/2016  . Tracheostomy care (HCC)   . 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 Essential hypertension   . HCAP (healthcare-associated pneumonia)   . Acute respiratory failure with hypoxia (HCC)   . Cerebrovascular accident (CVA) due to embolism of right carotid artery (HCC)   . Encounter for intubation   . Chronic a-fib (HCC)   . Dysphagia   . Acute blood loss anemia   . Thrombocytopenia (HCC)   . Benign essential HTN   . Bradycardia   . Pain   . Ventilator dependence (HCC)   . Cerebral embolism with cerebral infarction 10/23/2016  . Cerebrovascular accident (CVA) due to thrombosis of precerebral artery (HCC) 10/23/2016     Orientation RESPIRATION BLADDER Height & Weight      (Disoriented X4)  Tracheostomy, O2 Incontinent Weight: 180 lb (81.6 kg) Height:  5\' 7"  (170.2 cm)  BEHAVIORAL SYMPTOMS/MOOD NEUROLOGICAL BOWEL NUTRITION STATUS      Incontinent Feeding tube (PEG )  AMBULATORY STATUS COMMUNICATION OF NEEDS Skin   Total Care Verbally PU Stage and Appropriate Care (Satge 3 pressure injury Sacrum. 6 cm x 4 cm x 0.3 cm with thin layer yellow adherent slough to half of wound bed.)                       Personal Care Assistance Level of Assistance  Bathing, Feeding, Dressing, Total care Bathing Assistance: Maximum assistance Feeding assistance: Maximum assistance Dressing Assistance: Maximum assistance Total Care Assistance: Maximum assistance   Functional Limitations Info  Sight, Hearing, Speech Sight Info: Impaired Hearing Info: Impaired Speech Info: Impaired    SPECIAL CARE FACTORS FREQUENCY  PT (By licensed PT), OT (By licensed OT)     PT Frequency:  (5) OT Frequency:  (5)            Contractures      Additional Factors Info  Code Status, Allergies Code Status Info:  (Full Code. ) Allergies Info:  (No Known Allergies. )           Current Medications (11/24/2016):  This is the current hospital active medication list Current Facility-Administered Medications  Medication Dose Route Frequency Provider Last Rate Last Dose  . 0.9 %  sodium chloride infusion   Intravenous Continuous Adrian SaranSital Mody, MD 75 mL/hr at 11/24/16 0716    . acetaminophen (TYLENOL) tablet 650 mg  650 mg Oral Q6H PRN Adrian SaranSital Mody, MD       Or  . acetaminophen (TYLENOL) suppository 650 mg  650 mg Rectal Q6H PRN Adrian SaranSital Mody, MD      . apixaban (ELIQUIS) tablet 5 mg  5 mg Per Tube BID Adrian SaranSital Mody, MD   5 mg at 11/24/16 1058  . bacitracin-polymyxin b (POLYSPORIN) ophthalmic ointment   Right Eye QHS Adrian SaranSital Mody, MD      . bisacodyl (DULCOLAX) suppository 10 mg  10 mg Rectal Daily PRN Adrian SaranSital Mody, MD      .  collagenase (SANTYL) ointment   Topical Daily Vipul Shah, MD      . feeding supplement (JEVITY 1.2 CAL) liquid 1,000 mL  1,000 mL Per Tube Continuous Adrian SaranSital Mody, MD 55 mL/hr at 11/24/16 0536 1,000 mL at 11/24/16 0536  . free water 100 mL  100 mL Per Tube Q8H Sital Mody, MD   100 mL at 11/24/16 0536  . furosemide (LASIX) tablet 20 mg  20 mg Oral Daily Adrian SaranSital Mody, MD   20 mg at 11/24/16 1058  . hydrALAZINE (APRESOLINE) tablet 50 mg  50 mg Per Tube Q8H Adrian SaranSital Mody, MD   50 mg at 11/23/16 2151  . ondansetron (ZOFRAN) tablet 4 mg  4 mg Oral Q6H PRN Adrian SaranSital Mody, MD       Or  . ondansetron (ZOFRAN) injection 4 mg  4 mg Intravenous Q6H PRN Adrian SaranSital Mody, MD      . pantoprazole sodium (PROTONIX) 40 mg/20 mL oral suspension 40 mg  40 mg Per Tube Daily Adrian SaranSital Mody, MD   40 mg at 11/24/16 1059  . piperacillin-tazobactam (ZOSYN) IVPB 3.375 g  3.375 g Intravenous Q8H Sital Mody, MD   3.375 g at 11/24/16 1057  . vancomycin (VANCOCIN) 1,250 mg in sodium chloride 0.9 % 250 mL IVPB  1,250 mg Intravenous Q18H Adrian SaranSital Mody, MD   1,250 mg at 11/23/16 2151     Discharge Medications: Please see discharge summary for a list of discharge medications.  Relevant Imaging Results:  Relevant Lab Results:   Additional Information  (SSN: [id number])  Stage 3 pressure injury, present on admission. Evolved from DTI.  Albumin 2.5  Wound type:Satge 3 pressure injury Pressure Ulcer POA: Yes Measurement: 6 cm x 4 cm x 0.3 cm with thin layer yellow adherent slough to half of wound bed.    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  Please use foam dressing under trach to absorb exudate and pad this area Wound WUJ:WJXBbed:pink, moist some devitalized tissue.  Drainage (amount, consistency, odor) minimal serosanguinous  No odor.  Periwound:Intact Dressing procedure/placement/frequency:Mattress with low air loss replacement.  Cleanse sacral wound with NS and pat gently dry.  Apply Santyl to wound bed. Cover with NS moist  gauze.  Secure with 4x4 gauze and ABD pad/tape.  Change daily.  Offload heels with PRevalon boots Foam dressing to trach site.  Change daily.  Turn and reposition every two hours.   Toren Tucholski, Darleen CrockerBailey M, LCSW

## 2016-11-24 NOTE — Consult Note (Signed)
WOC Nurse wound consult note Reason for Consult: Stage 3 pressure injury, present on admission. Evolved from DTI.  Albumin 2.5  Wound type:Satge 3 pressure injury Pressure Ulcer POA: Yes Measurement: 6 cm x 4 cm x 0.3 cm with thin layer yellow adherent slough to half of wound bed.    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  Please use foam dressing under trach to absorb exudate and pad this area Wound FAO:ZHYQbed:pink, moist some devitalized tissue.  Drainage (amount, consistency, odor) minimal serosanguinous  No odor.  Periwound:Intact Dressing procedure/placement/frequency:Mattress with low air loss replacement.  Cleanse sacral wound with NS and pat gently dry.  Apply Santyl to wound bed. Cover with NS moist gauze.  Secure with 4x4 gauze and ABD pad/tape.  Change daily.  Offload heels with PRevalon boots Foam dressing to trach site.  Change daily.  Turn and reposition every two hours.  Will not follow at this time.  Please re-consult if needed.  Maple HudsonKaren Sallyann Kinnaird RN BSN CWON Pager [phone]

## 2016-11-24 NOTE — Progress Notes (Signed)
Initial Nutrition Assessment  DOCUMENTATION CODES:   Non-severe (moderate) malnutrition in context of acute illness/injury  INTERVENTION:  Recommend increasing rate of tube feeding to Jevity 1.2 @ 70 ml/hr via PEG tube. Also recommend Pro-Stat 30 ml once daily. Goal regimen provides 2116 kcal, 108 grams protein, 1360 ml H2O daily.  Can continue free water flushes of 100 ml Q8hrs (additional 300 ml H2O).  NUTRITION DIAGNOSIS:   Swallowing difficulty related to dysphagia, acute illness (CVA) as evidenced by NPO status, other (see comment) (reliance on PEG tube for tube feeding).  GOAL:   Patient will meet greater than or equal to 90% of their needs  MONITOR:   Labs, Weight trends, I & O's, TF tolerance, Skin  REASON FOR ASSESSMENT:   Low Braden, Consult Enteral/tube feeding initiation and management  ASSESSMENT:   82 y.o. male who was recently discharged from [facility] after prolonged hospital stay with Right ICA occlusion/MCA CVA status post thrombectomy with residual paralysis of left side, unresponsiveness, tracheostomy and PEG tube with dysphasia who presents from [facility] with decompensation in health. Patient presents with sepsis due to aspiration pneumonia, also has sacral decubitus ulcer.   -Per chart family meeting was held yesterday to discuss patient's diagnosis and poor prognosis (< 6 months). Family would like to continue full code status at this time but are open to palliative care consult. Noted consult in for palliative care for code status.  Attempted to speak with patient/family at bedside with translator phone, but patient unresponsive and no family at bedside. Patient with tracheostomy collar. Regimen patient discharged from [facility] with was Jevity 1.2 @ 55 ml/hr + Pro-Stat 30 ml TID and free water flushes of 100 ml Q8hrs. Very limited weight history in chart (it appears [place] moved [place] earlier this year per note in  Care Everywhere). Patient's weight fluctuated between 165-200 lbs during admission at [facility]. This RD obtained bed scale weight of 173.8 lbs (79 kg) today with 1 pillow and 1 blanket on bed. Noted that this admission patient has height of 5\' 7"  while at [facility] height was entered as 6'.  Tube Feeding: Currently running Jevity 1.2 @ 55 ml/hr via PEG tube with free water flushes of 100 ml Q8hrs.  Medications reviewed and include: Lasix 20 mg daily, pantoprazole, Zosyn, vancomycin, NS @ 75 ml/hr (1.8 L/day).  Labs reviewed: Potassium 3.4, BUN 27, Glucose 223.  Nutrition-Focused physical exam completed. Findings are mild fat depletion, mild muscle depletion, and moderate edema (left hand) and mild edema generalized.  Patient meets criteria for moderate acute malnutrition in setting of mild muscle depletion, mild depletion of body fat, mild to moderate fluid accumulation.   Discussed with RN.  Diet Order:  Diet NPO time specified  Skin:  Wound (see comment) (Stg II to sacrum)  Last BM:  11/24/2016  Height:   Ht Readings from Last 1 Encounters:  11/23/16 5\' 7"  (1.702 m)    Weight:   Wt Readings from Last 1 Encounters:  11/24/16 173 lb 12.8 oz (78.8 kg)    Ideal Body Weight:  67.27 kg  BMI:  Body mass index is 27.22 kg/m.  Estimated Nutritional Needs:   Kcal:  2000-2140 (MSJ x 1.4-1.5)  Protein:  103-120 grams (1.3-1.5 grams/kg)  Fluid:  >/= 2 L/day (25 ml/kg)  EDUCATION NEEDS:   No education needs identified at this time  Helane RimaLeanne Providencia Hottenstein, MS, RD, LDN Pager: [phone] After Hours Pager: [phone]

## 2016-11-24 NOTE — Clinical Social Work Note (Signed)
Clinical Social Work Assessment  Patient Details  Name: Sean [name] MRN: [id number] Date of Birth: August 20, 1926  Date of referral:  11/24/16               Reason for consult:  Facility Placement, Other (Comment Required) (From [facility])                Permission sought to share information with:  Oceanographeracility Contact Representative Permission granted to share information::  Yes, Verbal Permission Granted  Name::      [facility]   Agency::   Skilled Nursing Facility   Relationship::     Contact Information:     Housing/Transportation Living arrangements for the past 2 months:  Skilled Nursing Facility, Single Family Home Source of Information:  Adult Children, Spouse Patient Interpreter Needed:  None Criminal Activity/Legal Involvement Pertinent to Current Situation/Hospitalization:  No - Comment as needed Significant Relationships:  Adult Children, Spouse Lives with:  Facility Resident, Spouse Do you feel safe going back to the place where you live?    Need for family participation in patient care:  Yes (Comment)  Care giving concerns:  Patient was recently discharged from [facility] on 11/20/16 to [facility]. Patient [facility] on 11/23/16.    Social Worker assessment / plan:  Visual merchandiserClinical Social Worker (CSW) received consult that patient is from a facility. [facility] patient has only been at [facility] for a few days. Per Sean Romneyoug patient has out of state insurance that is not in network with any facilities in [place] however they approved a single case agreement for patient to go to [facility]. Per Sean Romneyoug if patient needs to return to [place] another authorization would have to be received. Per [employer] [name] will pursue another authorization. Per chart patient is not alert and oriented and no family is at bedside. CSW contacted patient's son Sean [name]. Per son patient and his  wife live with him in [place]. Per son patient and wife were here visiting son from [place] when patient got sick. Son reported that patient and wife were living with their other son in [place]. Son reported that [facility] staff told him that it was too early to D/C from [facility] and patient decompensated quickly once he got to [facility]. CSW explained that another authorization will be required for patient to return to [facility]. Son asked CSW if it would be better for patient to return to [place]. CSW explained that yes for insurance purposes patient is out of network for everything in [place] however CSW explained that patient has a trach and peg and traveling long distances may not be feasible. CSW provided son emotional support. CSW will continue to follow and assist as needed.   Employment status:  Disabled (Comment on whether or not currently receiving Disability), Retired Community education officernsurance information:  Other (Comment Required) (Out of state insurance. ) PT Recommendations:  Not assessed at this time Information / Referral to community resources:  Skilled Nursing Facility  Patient/Family's Response to care:  Son would like for patient to return to [facility] if possible.   Patient/Family's Understanding of and Emotional Response to Diagnosis, Current Treatment, and Prognosis:  Patient's son was very pleasant and thanked CSW for calling.   Emotional Assessment Appearance:  Appears stated age Attitude/Demeanor/Rapport:  Unable to Assess Affect (typically observed):  Unable to Assess Orientation:  Fluctuating Orientation (Suspected and/or reported Sundowners) Alcohol / Substance use:  Not Applicable Psych involvement (Current and /or in the community):  No (Comment)  Discharge Needs  Concerns to be addressed:  Discharge Planning Concerns Readmission within the last 30 days:  Yes Current discharge risk:  Chronically ill, Cognitively Impaired, Dependent with Mobility Barriers to  Discharge:  Continued Medical Work up   [employer], Sean CrockerBailey M, LCSW 11/24/2016, 11:31 AM

## 2016-11-25 LAB — CBC
HEMATOCRIT: 28.7 % — AB (ref 40.0–52.0)
Hemoglobin: 9.6 g/dL — ABNORMAL LOW (ref 13.0–18.0)
MCH: 29.3 pg (ref 26.0–34.0)
MCHC: 33.3 g/dL (ref 32.0–36.0)
MCV: 87.9 fL (ref 80.0–100.0)
PLATELETS: 148 10*3/uL — AB (ref 150–440)
RBC: 3.27 MIL/uL — ABNORMAL LOW (ref 4.40–5.90)
RDW: 15.9 % — AB (ref 11.5–14.5)
WBC: 6.9 10*3/uL (ref 3.8–10.6)

## 2016-11-25 LAB — GLUCOSE, CAPILLARY
GLUCOSE-CAPILLARY: 163 mg/dL — AB (ref 65–99)
GLUCOSE-CAPILLARY: 168 mg/dL — AB (ref 65–99)
GLUCOSE-CAPILLARY: 172 mg/dL — AB (ref 65–99)
GLUCOSE-CAPILLARY: 179 mg/dL — AB (ref 65–99)
Glucose-Capillary: 141 mg/dL — ABNORMAL HIGH (ref 65–99)
Glucose-Capillary: 157 mg/dL — ABNORMAL HIGH (ref 65–99)
Glucose-Capillary: 122 mg/dL — ABNORMAL HIGH (ref 65–99)
Glucose-Capillary: 164 mg/dL — ABNORMAL HIGH (ref 65–99)
Glucose-Capillary: 177 mg/dL — ABNORMAL HIGH (ref 65–99)

## 2016-11-25 LAB — BASIC METABOLIC PANEL
ANION GAP: 4 — AB (ref 5–15)
BUN: 20 mg/dL (ref 6–20)
CALCIUM: 7.7 mg/dL — AB (ref 8.9–10.3)
CO2: 27 mmol/L (ref 22–32)
Chloride: 111 mmol/L (ref 101–111)
Creatinine, Ser: 0.57 mg/dL — ABNORMAL LOW (ref 0.61–1.24)
Glucose, Bld: 167 mg/dL — ABNORMAL HIGH (ref 65–99)
Potassium: 3.7 mmol/L (ref 3.5–5.1)
Sodium: 142 mmol/L (ref 135–145)

## 2016-11-25 MED ORDER — IPRATROPIUM-ALBUTEROL 0.5-2.5 (3) MG/3ML IN SOLN: 3.0000 mL | Freq: Four times a day (QID) | RESPIRATORY_TRACT | Status: DC | PRN

## 2016-11-25 NOTE — Care Management Important Message (Signed)
Important Message  Patient Details  Name: Luis AbedKong Austell MRN: [id number] Date of Birth: 1926/06/28   Medicare Important Message Given:  Yes    Gwenette GreetBrenda S Courtland Coppa, RN 11/25/2016, 7:40 AM

## 2016-11-25 NOTE — Plan of Care (Signed)
Problem: Activity: Goal: Ability to tolerate increased activity will improve Outcome: Progressing Turn Q2 hrs. HOB 45 degress for TF.  Problem: Respiratory: Goal: Patent airway maintenance will improve Outcome: Progressing Provided oral care.  Provided trach care.  Suctioned trach. Thick yellow secretions noted. Trach supplies at bedside.   Problem: Role Relationship: Goal: Ability to communicate will improve Outcome: Progressing Unable to verbally communicate related to trach.   

## 2016-11-25 NOTE — Progress Notes (Addendum)
[facility] [place] [facility]   PATIENT NAME: Sean [name]    MR#:  [id number]  DATE OF BIRTH:  1926/05/29  SUBJECTIVE:  CHIEF COMPLAINT:   Chief Complaint  Patient presents with  . Pneumonia  about same. Some more alert and trying talk  REVIEW OF SYSTEMS:  Review of Systems  Unable to perform ROS: Critical illness    DRUG ALLERGIES:  No Known Allergies VITALS:  Blood pressure 132/72, pulse 82, temperature 98.9 F (37.2 C), temperature source Axillary, resp. rate 18, height 5\' 7"  (1.702 m), weight 81.6 kg (180 lb), SpO2 100 %. PHYSICAL EXAMINATION:  Physical Exam  Constitutional: No distress.  Chronically ill-appearing unresponsive  HENT:  Head: Normocephalic.  Eyes: No scleral icterus.  Neck: No JVD present. No tracheal deviation present.  Tracheostomy  Cardiovascular: Normal rate, regular rhythm and normal heart sounds.  Exam reveals no gallop and no friction rub.   No murmur heard. Pulmonary/Chest: No respiratory distress. He has no wheezes. He has no rales. He exhibits no tenderness.  Bilateral rhonchi  increase work of breathing  Abdominal: Soft. Bowel sounds are normal. He exhibits no distension and no mass. There is no tenderness. There is no rebound and no guarding.  PEG tube placed  Musculoskeletal: He exhibits no edema.  Neurological:  Unresponsive with left-sided paralysis  Skin: Skin is warm. No rash noted. There is erythema.  Sacral pressure ulcer  LABORATORY PANEL:   CBC  Recent Labs Lab 11/25/16 0328  WBC 6.9  HGB 9.6*  HCT 28.7*  PLT 148*   ------------------------------------------------------------------------------------------------------------------ Chemistries   Recent Labs Lab 11/23/16 1439  11/25/16 0328  NA 142  < > 142  K 4.2  < > 3.7  CL 105  < > 111  CO2 29  < > 27  GLUCOSE 198*  < > 167*  BUN 31*  < > 20  CREATININE 0.86  < > 0.57*  CALCIUM 8.2*  < > 7.7*  AST 35  --   --   ALT 25  --   --     ALKPHOS 66  --   --   BILITOT 0.9  --   --   < > = values in this interval not displayed. RADIOLOGY:  No results found. ASSESSMENT AND PLAN:  82 year old male who is discharged on December 8 for [facility] after 1 month hospitalization with multiple complications and now a tracheostomy and PEG tube who presents with sepsis due to aspiration pneumonia  1. Sepsis: Present on admission with fever and increased respiratory rate on admission due to aspiration pneumonia continue Zosyn  MRSA PCR negative - continue IV fluids  2. Aspiration pneumonia: Dietary consultation for tube feedings Aspiration precautions It should be noted patient due to his CVA and left-sided process and unresponsiveness has high risk of chronic aspiration.  3. Chronic atrial fibrillation: Rate is controlled, continue Eliquis  4. Dysphagia: Patient has PEG and dietary following Abdominal binder in place  5. 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 Continue current anticoagulation  6. Sacral decubitus ulcer: Wound care input apprciated Patient has very poor prognosis.  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  Moderate protein calorie malnutrition  8.  Hypokalemia - Replete and recheck  All the records are reviewed and case discussed with Care Management/Social Worker. Management plans discussed with the patient, family and they are in agreement.  CODE STATUS: Full code  TOTAL TIME TAKING CARE OF THIS PATIENT: 35 minutes.   More than 50% of the time was spent in counseling/coordination of care: YES  POSSIBLE D/C IN 1-2 DAYS, DEPENDING ON CLINICAL CONDITION. Back to [facility]? Or go back to [place] (COST?)   Delfino LovettVipul Gisela Lea M.D on 11/25/2016 at 2:40 PM  Between 7am to 6pm -  Pager - [phone]  After 6pm go to [email or web address] - Scientist, research (life sciences)password [email or web address]  [facility] [place] [facility]  Office  [phone]  CC: Primary care physician; No PCP Per Patient  Note: This dictation was prepared with Dragon dictation along with smaller phrase technology. Any transcriptional errors that result from this process are unintentional.

## 2016-11-25 NOTE — Progress Notes (Signed)
Daily Progress Note   Patient Name: Sean [name]       Date: 11/25/2016 DOB: 06/16/1926  Age: 82 y.o. MRN#: [id number] Attending Physician: Delfino Lovett, MD Primary Care Physician: No PCP Per Patient Admit Date: 11/23/2016  Reason for Consultation/Follow-up: Establishing goals of care  Subjective: Son, Maisie Fus, is in [place] and unable to meet with me this afternoon. Spoke with him via telephone. I asked if him and his mom talked with the patient about code status. He does not give me a clear answer and tells me this decision has not been made. Encouraged Maisie Fus to continue conversations regarding this big decision he may be faced with in the future. Maisie Fus is most concerned with where his dad will go after this hospitalization. Yesterday, family had asked about him being transferred to [place] if stable. Discussed with Maisie Fus that if this was even possible, it would be an out-of-pocket expense and very expensive for the family.   Length of Stay: 2  Current Medications: Scheduled Meds:  . apixaban  5 mg Per Tube BID  . bacitracin-polymyxin b   Right Eye QHS  . collagenase   Topical Daily  . feeding supplement (PRO-STAT SUGAR FREE 64)  30 mL Per Tube Daily  . free water  100 mL Per Tube Q8H  . furosemide  20 mg Oral Daily  . hydrALAZINE  50 mg Per Tube Q8H  . insulin aspart  0-9 Units Subcutaneous Q4H  . pantoprazole sodium  40 mg Per Tube Daily  . piperacillin-tazobactam (ZOSYN)  IV  3.375 g Intravenous Q8H    Continuous Infusions: . sodium chloride 75 mL/hr at 11/25/16 0830  . feeding supplement (JEVITY 1.2 CAL) 1,000 mL (11/25/16 0830)    PRN Meds: acetaminophen **OR** acetaminophen, bisacodyl, ondansetron **OR** ondansetron (ZOFRAN) IV  Physical Exam  Constitutional: He  appears ill.  Cardiovascular: Regular rhythm.  Exam reveals distant heart sounds.   Pulmonary/Chest: He has rhonchi.  Trach/trach collar 28%  Abdominal: Soft. Bowel sounds are normal.  Musculoskeletal: He exhibits edema (generalized/LUE).  Neurological: He is alert.  Skin: Skin is warm and dry. There is pallor.  Psychiatric: He has a normal mood and affect. His behavior is normal. He is inattentive.  Nursing note and vitals reviewed.          Vital Signs: BP 132/72 (  BP Location: Left Arm)   Pulse 82   Temp 98.9 F (37.2 C) (Axillary)   Resp 18   Ht 5\' 7"  (1.702 m)   Wt 81.6 kg (180 lb)   SpO2 100%   BMI 28.19 kg/m  SpO2: SpO2: 100 % O2 Device: O2 Device: Tracheostomy Collar O2 Flow Rate: O2 Flow Rate (L/min): 5 L/min  Intake/output summary:  Intake/Output Summary (Last 24 hours) at 11/25/16 1425 Last data filed at 11/25/16 0830  Gross per 24 hour  Intake          4096.34 ml  Output                0 ml  Net          4096.34 ml   LBM: Last BM Date: 11/24/16 Baseline Weight: Weight: 81.6 kg (180 lb) Most recent weight: Weight: 81.6 kg (180 lb)   Palliative Assessment/Data: 10%   Flowsheet Rows   Flowsheet Row Most Recent Value  Intake Tab  Referral Department  Hospitalist  Unit at Time of Referral  Oncology Unit  Palliative Care Primary Diagnosis  Sepsis/Infectious Disease  Date Notified  11/23/16  Palliative Care Type  Return patient Palliative Care  Reason for referral  Clarify Goals of Care  Date of Admission  11/23/16  Date first seen by Palliative Care  11/24/16  # of days Palliative referral response time  1 Day(s)  # of days IP prior to Palliative referral  0  Clinical Assessment  Palliative Performance Scale Score  10%  Psychosocial & Spiritual Assessment  Palliative Care Outcomes  Patient/Family meeting held?  Yes  Who was at the meeting?  patient, wife, son Maisie Fus([name]), and spoke with multiple family members over the phone.   Palliative Care Outcomes   Clarified goals of care, Provided psychosocial or spiritual support, Provided advance care planning      Patient Active Problem List   Diagnosis Date Noted  . Malnutrition of moderate degree 11/24/2016  . Palliative care encounter   . DNR (do not resuscitate) discussion   . Sepsis (HCC) 11/23/2016  . Stroke (HCC)   . 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 Acute pulmonary edema (HCC)   . Chronic atrial fibrillation (HCC)   . Other hyperlipidemia   . Oral phase dysphagia   . Prolonged QT interval   . Goals of care, counseling/discussion   . Palliative care by specialist   . Advance care planning   . VAP (ventilator-associated pneumonia) (HCC)   . Cerebrovascular accident (CVA) (HCC)   . Bleeding   . Pressure injury of skin 11/10/2016  . Tracheostomy care (HCC)   . 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 Essential hypertension   . HCAP (healthcare-associated pneumonia)   . Acute respiratory failure with hypoxia (HCC)   . Cerebrovascular accident (CVA) due to embolism of right carotid artery (HCC)   . Encounter for intubation   . Chronic a-fib (HCC)   . Dysphagia   . Acute blood loss anemia   . Thrombocytopenia (HCC)   . Benign essential HTN   . Bradycardia   . Pain   . Ventilator dependence (HCC)   . Cerebral embolism with cerebral infarction 10/23/2016  . Cerebrovascular accident (CVA) due to thrombosis of precerebral artery (HCC) 10/23/2016    Palliative Care Assessment & Plan   Patient Profile: 82 y.o. male  with past medical history of hypertension, hyperlipidemia, atrial fibrillation,  and recent stroke admitted on 11/23/2016 with thick sputum and temperature per family. Patient was [facility] after prolonged hospital stay for right ICA occlusion/MCA CVA s/p thrombectomy. Patient with left paralysis. He had tracheostomy and PEG tube placed and [facility]. In ED, patient septic due to aspiration pneumonia. Vancomycin and Zosyn initiated. Palliative medicine consultation for goals of care.   Assessment: Sepsis Aspiration pneumonia Chronic atrial fibrillation Recent right CVA s/p trach and PEG Sacral decubitus ulcer  Recommendations/Plan:  PMT will continue to meet with family to discuss goals of care and code status during hospitalization.   Patient could benefit from outpatient palliative but it has been challenging finding disposition with out of state insurance.   Goals of Care and Additional Recommendations:  Limitations on Scope of Treatment: Full Scope Treatment  Code Status: FULL   Code Status Orders        Start     Ordered   11/23/16 1702  Full code  Continuous     11/23/16 1702    Code Status History    Date Active Date Inactive Code Status Order ID Comments User Context   11/02/2016 10:51 AM 11/20/2016  7:06 PM Full Code [id number]  Lonia BloodJeffrey T McClung, MD Inpatient   10/23/2016  2:16 PM 11/02/2016 10:51 AM Full Code [id number]  Julieanne CottonSanjeev Deveshwar, MD Inpatient   10/23/2016  8:16 AM 10/23/2016  2:16 PM Full Code [id number]  Caryl PinaEric Lindzen, MD ED   10/23/2016  8:14 AM 10/23/2016  8:16 AM Full Code [id number]  Rejeana BrockMcNeill P Kirkpatrick, MD ED       Prognosis:   Unable to determine  Discharge Planning:  To Be Determined  Care plan was discussed with wife and son Maisie Fus([name])  Thank you for allowing the Palliative Medicine Team to assist in the care of this patient.   Time In: 1425 Time Out: 1500 Total Time 35min Prolonged Time Billed  no       Greater than 50%  of this time was spent counseling and coordinating care related to the above assessment and plan.  Vennie HomansMegan Sherena Machorro, FNP-C Palliative Medicine Team  Phone: [phone] Fax: [phone]  Please contact Palliative Medicine Team phone at [phone] for questions and concerns.

## 2016-11-25 NOTE — Progress Notes (Signed)
Nutrition Follow-up  DOCUMENTATION CODES:   Non-severe (moderate) malnutrition in context of acute illness/injury  INTERVENTION:   Jevity 1.2 @ 70 ml/hr via PEG tube. Pro-Stat 30 ml once daily. Goal regimen provides 2116 kcal, 108 grams protein, 1360 ml H2O daily.  Free water flushes per MD- Currently at 125m q 8hrs.   NUTRITION DIAGNOSIS:   Swallowing difficulty related to dysphagia, acute illness (CVA) as evidenced by NPO status, other (see comment) (reliance on PEG tube for tube feeding).  GOAL:   Patient will meet greater than or equal to 90% of their needs  MONITOR:   Labs, Weight trends, I & O's, TF tolerance, Skin  ASSESSMENT:   82[facility].o. [facility] occlusion/MCA CVA status post thrombectomy with residual paralysis of left side, unresponsiveness, tracheostomy and PEG tube with dysphasia [facility] Patient presents with sepsis due to aspiration pneumonia, also has sacral decubitus ulcer.  Met with pt in room today. Pt unable to communicate. Spoke with family member at bedside who reports that she believes pt is 5'7" tall. TFs increased 12/12. Pt's creatinine today decreased from yesterday. Will continue to monitor to see if protein intake needs to be increased. Pt getting Prostat SID. Tolerating TFs well. Pt currently getting free water flushes at 1054mq8hrs.        Medications reviewed and include: lasix, insulin, protonix, zosyn   Labs reviewed: creat 0.57(L), Ca 7.7(L), Alb 2.7(L) 12/11 Hgb- 9.6(L), Hct 28.7(L) CBGs- 198, 223, 167 x 48hrs AIC 6.2(H)- 11/11  Diet Order:  Diet NPO time specified  Skin:  Wound (see comment) (Stage II Sacrum)  Last BM:  12/12  Height:   Ht Readings from Last 1 Encounters:  11/23/16 5' 7"  (1.702 m)    Weight:   Wt Readings from Last 1 Encounters:  11/25/16 180 lb (81.6 kg)     Ideal Body Weight:  67.27 kg  BMI:  Body mass index is 28.19 kg/m.  Estimated Nutritional Needs:   Kcal:  2000-2140 (MSJ x 1.4-1.5)  Protein:  103-120 grams (1.3-1.5 grams/kg)  Fluid:  >/= 2 L/day (25 ml/kg)  EDUCATION NEEDS:   No education needs identified at this time  CaKoleen DistanceRD, LDN

## 2016-11-25 NOTE — Plan of Care (Signed)
Problem: Activity: Goal: Ability to tolerate increased activity will improve Outcome: Progressing Turn q 2 hours.   Problem: Respiratory: Goal: Patent airway maintenance will improve Outcome: Progressing Tracheal suctioning and trach care provided.   Problem: Role Relationship: Goal: Ability to communicate will improve Outcome: Adequate for Discharge Pt has trach.

## 2016-11-25 NOTE — Progress Notes (Signed)
Clinical Child psychotherapistocial Worker (CSW) left Pensions consultantCSW director voicemail to discuss case. Patient has no payer for SNF.   [employer] IncorporatedBailey Philana Younis, LCSW [phone]

## 2016-11-26 LAB — GLUCOSE, CAPILLARY
GLUCOSE-CAPILLARY: 164 mg/dL — AB (ref 65–99)
Glucose-Capillary: 180 mg/dL — ABNORMAL HIGH (ref 65–99)
Glucose-Capillary: 140 mg/dL — ABNORMAL HIGH (ref 65–99)
Glucose-Capillary: 142 mg/dL — ABNORMAL HIGH (ref 65–99)

## 2016-11-26 LAB — CBC
HEMATOCRIT: 29.2 % — AB (ref 40.0–52.0)
HEMOGLOBIN: 9.6 g/dL — AB (ref 13.0–18.0)
MCH: 28.8 pg (ref 26.0–34.0)
MCHC: 33 g/dL (ref 32.0–36.0)
MCV: 87.3 fL (ref 80.0–100.0)
Platelets: 155 10*3/uL (ref 150–440)
RBC: 3.35 MIL/uL — AB (ref 4.40–5.90)
RDW: 16.3 % — ABNORMAL HIGH (ref 11.5–14.5)
WBC: 6.8 10*3/uL (ref 3.8–10.6)

## 2016-11-26 LAB — BASIC METABOLIC PANEL
Anion gap: 5 (ref 5–15)
BUN: 15 mg/dL (ref 6–20)
CHLORIDE: 110 mmol/L (ref 101–111)
CO2: 26 mmol/L (ref 22–32)
CREATININE: 0.55 mg/dL — AB (ref 0.61–1.24)
Calcium: 7.6 mg/dL — ABNORMAL LOW (ref 8.9–10.3)
GFR calc non Af Amer: >60 mL/min (ref >60)
Glucose, Bld: 190 mg/dL — ABNORMAL HIGH (ref 65–99)
POTASSIUM: 3.6 mmol/L (ref 3.5–5.1)
Sodium: 141 mmol/L (ref 135–145)

## 2016-11-26 NOTE — Progress Notes (Signed)
Daily Progress Note   Patient Name: Sean [name]       Date: 11/26/2016 DOB: July 11, 1926  Age: 82 y.o. MRN#: [id number] Attending Physician: Delfino Lovett, MD Primary Care Physician: No PCP Per Patient Admit Date: 11/23/2016  Reason for Consultation/Follow-up: Establishing goals of care  Subjective: Patient is working with physical therapy this morning. Son, Sean [name] at bedside assisting with translation. Sean [name] tells [place] the patient understands his medical condition and that he will most likely not be able to get out of bed again but that he "wants to continue to fight." Sean [name] is most concerned about where is father will go from here. He is filling out Medicaid paperwork in the morning.   Son-in-law (Sean [name]) on the phone asks if his dad is improving. Discussed that he is "stable" for discharge once a facility accepts him but that this will most likely be his baseline for the rest of his life. Again discussed my concern with this becoming a cycle of declining health with high risk for aspiration, infection, bed sores, frequent hospitalization, and overall failure to thrive. Strongly encouraged Sean [name] and Sean [name] to continue conversations with the family regarding these decisions (code status/rehospitalization) they will be faced with in the future if and when he declines. Answered questions and offered support.   Length of Stay: 3  Current Medications: Scheduled Meds:  . apixaban  5 mg Per Tube BID  . bacitracin-polymyxin b   Right Eye QHS  . collagenase   Topical Daily  . feeding supplement (PRO-STAT SUGAR FREE 64)  30 mL Per Tube Daily  . free water  100 mL Per Tube Q8H  . furosemide  20 mg Oral Daily  . hydrALAZINE  50 mg Per Tube Q8H  . insulin aspart  0-9 Units Subcutaneous Q4H  . pantoprazole  sodium  40 mg Per Tube Daily  . piperacillin-tazobactam (ZOSYN)  IV  3.375 g Intravenous Q8H    Continuous Infusions: . sodium chloride 75 mL/hr at 11/25/16 2138  . feeding supplement (JEVITY 1.2 CAL) 1,000 mL (11/25/16 1707)    PRN Meds: acetaminophen **OR** acetaminophen, bisacodyl, ipratropium-albuterol, ondansetron **OR** ondansetron (ZOFRAN) IV  Physical Exam  Constitutional: He appears ill.  Cardiovascular: Regular rhythm.  Exam reveals distant heart sounds.   Pulmonary/Chest: He has rhonchi.  Trach/trach collar 28%  Abdominal: Soft. Bowel sounds  are normal.  Musculoskeletal: He exhibits edema (generalized/LUE).  Neurological: He is alert.  Skin: Skin is warm and dry. There is pallor.  Psychiatric: He has a normal mood and affect. His behavior is normal. He is inattentive.  Nursing note and vitals reviewed.          Vital Signs: BP 119/69 (BP Location: Right Arm)   Pulse 82   Temp 98.4 F (36.9 C) (Oral)   Resp (!) 22   Ht 5\' 7"  (1.702 m)   Wt 81.2 kg (179 lb)   SpO2 100%   BMI 28.04 kg/m  SpO2: SpO2: 100 % O2 Device: O2 Device: Tracheostomy Collar O2 Flow Rate: O2 Flow Rate (L/min): 5 L/min  Intake/output summary:   Intake/Output Summary (Last 24 hours) at 11/26/16 1111 Last data filed at 11/26/16 16100517  Gross per 24 hour  Intake          3300.92 ml  Output                0 ml  Net          3300.92 ml   LBM: Last BM Date: 11/24/16 Baseline Weight: Weight: 81.6 kg (180 lb) Most recent weight: Weight: 81.2 kg (179 lb)   Palliative Assessment/Data: 10%   Flowsheet Rows   Flowsheet Row Most Recent Value  Intake Tab  Referral Department  Hospitalist  Unit at Time of Referral  Oncology Unit  Palliative Care Primary Diagnosis  Sepsis/Infectious Disease  Date Notified  11/23/16  Palliative Care Type  Return patient Palliative Care  Reason for referral  Clarify Goals of Care  Date of Admission  11/23/16  Date first seen by Palliative Care  11/24/16  # of  days Palliative referral response time  1 Day(s)  # of days IP prior to Palliative referral  0  Clinical Assessment  Palliative Performance Scale Score  10%  Psychosocial & Spiritual Assessment  Palliative Care Outcomes  Patient/Family meeting held?  Yes  Who was at the meeting?  patient, wife, son Sean [name]([name]), and spoke with multiple family members over the phone.   Palliative Care Outcomes  Clarified goals of care, Provided psychosocial or spiritual support, Provided advance care planning      Patient Active Problem List   Diagnosis Date Noted  . Malnutrition of moderate degree 11/24/2016  . Palliative care encounter   . DNR (do not resuscitate) discussion   . Sepsis (HCC) 11/23/2016  . Stroke (HCC)   . 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 Acute pulmonary edema (HCC)   . Chronic atrial fibrillation (HCC)   . Other hyperlipidemia   . Oral phase dysphagia   . Prolonged QT interval   . Goals of care, counseling/discussion   . Palliative care by specialist   . Advance care planning   . VAP (ventilator-associated pneumonia) (HCC)   . Cerebrovascular accident (CVA) (HCC)   . Bleeding   . Pressure injury of skin 11/10/2016  . Tracheostomy care (HCC)   . 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 Essential hypertension   . HCAP (healthcare-associated pneumonia)   . Acute respiratory failure with hypoxia (HCC)   . Cerebrovascular accident (CVA) due to embolism of right carotid artery (HCC)   . Encounter for intubation   . Chronic a-fib (HCC)   . Dysphagia   . Acute blood loss anemia   . Thrombocytopenia (HCC)   . Benign essential HTN   .  Bradycardia   . Pain   . Ventilator dependence (HCC)   . Cerebral embolism with cerebral infarction 10/23/2016  . Cerebrovascular accident (CVA) due to thrombosis of precerebral artery (HCC) 10/23/2016    Palliative Care Assessment & Plan   Patient Profile: 82 y.o. male   with past medical history of hypertension, hyperlipidemia, atrial fibrillation, and recent stroke admitted on 11/23/2016 with thick sputum and temperature per family. [facility] for right ICA occlusion/MCA CVA s/p thrombectomy. Patient with left paralysis. He had tracheostomy and PEG tube placed and discharged to [facility]. In ED, patient septic due to aspiration pneumonia. Vancomycin and Zosyn initiated. Palliative medicine consultation for goals of care.   Assessment: Sepsis Aspiration pneumonia Chronic atrial fibrillation Recent right CVA s/p trach and PEG Sacral decubitus ulcer  Recommendations/Plan:  Family requires continued education and support with GOC and code status.   PMT will continue to follow during hospitalization.    Patient could benefit from outpatient palliative but it has been challenging finding disposition with out of state insurance.   Goals of Care and Additional Recommendations:  Limitations on Scope of Treatment: Full Scope Treatment  Code Status: FULL   Code Status Orders        Start     Ordered   11/23/16 1702  Full code  Continuous     11/23/16 1702    Code Status History    Date Active Date Inactive Code Status Order ID Comments User Context   11/02/2016 10:51 AM 11/20/2016  7:06 PM Full Code [id number]  Lonia BloodJeffrey T McClung, MD Inpatient   10/23/2016  2:16 PM 11/02/2016 10:51 AM Full Code [id number]  Julieanne CottonSanjeev Deveshwar, MD Inpatient   10/23/2016  8:16 AM 10/23/2016  2:16 PM Full Code [id number]  Caryl PinaEric Lindzen, MD ED   10/23/2016  8:14 AM 10/23/2016  8:16 AM Full Code [id number]  Rejeana BrockMcNeill P Kirkpatrick, MD ED       Prognosis:   Unable to determine  Discharge Planning:  To Be Determined  Care plan was discussed with wife, son's Sean [name]([name] and [place]), PT, RN, and Dr. Sherryll BurgerShah  Thank you for allowing the Palliative Medicine Team to assist in the care of this  patient.   Time In: 1040 Time Out: 1120 Total Time 40min Prolonged Time Billed  no       Greater than 50%  of this time was spent counseling and coordinating care related to the above assessment and plan.  Vennie HomansMegan Gwendelyn Lanting, FNP-C Palliative Medicine Team  Phone: [phone] Fax: [phone]  Please contact Palliative Medicine Team phone at [phone] for questions and concerns.

## 2016-11-26 NOTE — Plan of Care (Signed)
Problem: Education: Goal: Knowledge of [facility] Education information/materials will improve Outcome: Not Progressing Wife at bedside and edu to  Her and pts son  Problem: Activity: Goal: Ability to tolerate increased activity will improve Outcome: Not Progressing Bedrest. P. T. Saw and  Performed passive  exercise  Problem: Respiratory: Goal: Patent airway maintenance will improve Outcome: Progressing Suctioned x 2 . Of  Mod  Thick  Tan to milky color sputum.

## 2016-11-26 NOTE — Progress Notes (Signed)
Clinical Social Worker (CSW) contacted patient's son [name] to discuss D/C plan. Son stated that he would like for patient to transfer back to [place]. CSW explained that patient is medically fragile and transport that far is very risky and expensive. CSW explained to son that he needs to apply for long term care medicaid at the department of social services on behalf of patient. Per son patient cannot pay privately for SNF.CSW also received a call from patient's son in law Ming Ten in [place]. Son in law reported that patient brings in about $1,000 per month and does not own property. Son in law also asked about transferring patient back to [place]. CSW made brother in law aware of the barriers to transferring that far. Son in law reported that he would talk to [place] about applying for long [place] care medicaid. CSW will continue to follow and assist as needed.   [employer]Bailey Kamran Coker, LCSW [phone]

## 2016-11-26 NOTE — Progress Notes (Signed)
[facility]   PATIENT NAME: Sean [name]    MR#:  [id number]  DATE OF BIRTH:  1926/07/09  SUBJECTIVE:  CHIEF COMPLAINT:   Chief Complaint  Patient presents with  . Pneumonia  more alert and trying to talk, son thomas at bedside  REVIEW OF SYSTEMS:  Review of Systems  Unable to perform ROS: Critical illness    DRUG ALLERGIES:  No Known Allergies VITALS:  Blood pressure 120/68, pulse 80, temperature 98.5 F (36.9 C), temperature source Oral, resp. rate 20, height 5\' 7"  (1.702 m), weight 81.2 kg (179 lb), SpO2 100 %. PHYSICAL EXAMINATION:  Physical Exam  Constitutional: No distress.  Chronically ill-appearing unresponsive  HENT:  Head: Normocephalic.  Eyes: No scleral icterus.  Neck: No JVD present. No tracheal deviation present.  Tracheostomy  Cardiovascular: Normal rate, regular rhythm and normal heart sounds.  Exam reveals no gallop and no friction rub.   No murmur heard. Pulmonary/Chest: No respiratory distress. He has no wheezes. He has no rales. He exhibits no tenderness.  Bilateral rhonchi  increase work of breathing  Abdominal: Soft. Bowel sounds are normal. He exhibits no distension and no mass. There is no tenderness. There is no rebound and no guarding.  PEG tube placed  Musculoskeletal: He exhibits no edema.  Neurological:  Unresponsive with left-sided paralysis  Skin: Skin is warm. No rash noted. There is erythema.  Sacral pressure ulcer  LABORATORY PANEL:   CBC  Recent Labs Lab 11/26/16 0517  WBC 6.8  HGB 9.6*  HCT 29.2*  PLT 155   ------------------------------------------------------------------------------------------------------------------ Chemistries   Recent Labs Lab 11/23/16 1439  11/26/16 0517  NA 142  < > 141  K 4.2  < > 3.6  CL 105  < > 110  CO2 29  < > 26  GLUCOSE 198*  < > 190*  BUN 31*  < > 15  CREATININE 0.86  < > 0.55*  CALCIUM 8.2*  < > 7.6*  AST 35  --   --   ALT 25  --   --    ALKPHOS 66  --   --   BILITOT 0.9  --   --   < > = values in this interval not displayed. RADIOLOGY:  No results found. ASSESSMENT AND PLAN:  82 year old male who is [facility] after 1 month hospitalization with multiple complications and now a tracheostomy and PEG tube who presents with sepsis due to aspiration pneumonia  1. Sepsis: Present on admission with fever and increased respiratory rate on admission due to aspiration pneumonia continue Zosyn  MRSA PCR negative - continue IV fluids  2. Aspiration pneumonia: Dietary consultation for tube feedings Aspiration precautions It should be noted patient due to his CVA and left-sided process and unresponsiveness has high risk of chronic aspiration.  3. Chronic atrial fibrillation: Rate is controlled, continue Eliquis  4. Dysphagia: Patient has PEG and dietary following Abdominal binder in place  5. 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 Continue current anticoagulation  6. Sacral decubitus ulcer: Wound care input apprciated Patient has very poor prognosis.  7. Device related stage 2 pressure injury to trach site - Present on Admission - dressing changes per wound care nurse   7.  Moderate protein calorie malnutrition  8.  Hypokalemia - Replete and recheck   All the records  are reviewed and case discussed with Care Management/Social Worker. Management plans discussed with the patient, family and they are in agreement.  CODE STATUS: Full code  TOTAL TIME TAKING CARE OF THIS PATIENT: 35 minutes.   More than 50% of the time was spent in counseling/coordination of care: YES  POSSIBLE D/C IN 1-2 DAYS, DEPENDING ON CLINICAL CONDITION. Back to local area SNF/STR Or go back to [place] (transportation COST/health safety?)   Delfino LovettVipul Ardena Gangl M.D on 11/26/2016 at 3:24  PM  Between 7am to 6pm - Pager - [phone]  After 6pm go to [email or web address] - Scientist, research (life sciences)password [email or web address]  [facility] [place] [facility]  Office  [phone]  CC: Primary care physician; No PCP Per Patient  Note: This dictation was prepared with Dragon dictation along with smaller phrase technology. Any transcriptional errors that result from this process are unintentional.

## 2016-11-26 NOTE — Progress Notes (Signed)
Clinical Child psychotherapistocial Worker (CSW) Interior and spatial designerdirector approved 30 day LOG. Bed search was started. CSW contacted the following facilities.    [facility] she will review referral.   [facility]: Per Tammy liaison she will review referral. Tammy reported that family would have to complete a disability application before they would consider taking patient.   [place]: [facility] no to LOG.   [facility]: Per admissions coordinator she will review referral.   CSW also contacted [facility] financial consoler to assist with medicaid and disability, she did not answer and a voicemail was left.   [employer]Bailey Sayana Salley, LCSW [phone]

## 2016-11-26 NOTE — Evaluation (Signed)
Physical Therapy Evaluation Patient Details Name: Sean [name] Ritchie MRN: [id number] DOB: 1925-12-20 Today's Date: 11/26/2016   History of Present Illness  admitted for acute hospitalization secondary to sepsis related to aspiration PNA.  Of note, patient with recent (early November, 2017) R MCA CVA status post thrombectomy per chart; status post trach and PEG placement (NPO).  Clinical Impression  Upon evaluation, patient lethargic but arousable to voice; intermittently follows simple commands (from son more than therapist), appears to prefer gestures/return demonstration vs. Verbal instruction (question language barrier, communication deficit or cognitive deficit?).  Dense L hemiplegia with increased tone noted in L UE; appears generally insensate with no response to light touch, minimal response to pain.  Obvious R visual preference (L field cut likely); maintains head in R rotated/laterally flexed position.  Currently requiring dep assist +2 for bed mobility and repositioning; unable to tolerate unsupported sitting/OOB attempts at this time (orders for strict bedrest in place). Would benefit from skilled PT to address above deficits and promote optimal return to PLOF; recommend transition to STR upon discharge from acute hospitalization.     Follow Up Recommendations SNF    Equipment Recommendations       Recommendations for Other Services OT consult     Precautions / Restrictions Precautions Precautions: Fall Precaution Comments: trach, peg, NPO Restrictions Weight Bearing Restrictions: No      Mobility  Bed Mobility Overal bed mobility: Needs Assistance Bed Mobility: Rolling Rolling: Total assist;+2 for physical assistance            Transfers                 General transfer comment: unsafe/unable; orders for strict bedrest  Ambulation/Gait             General Gait Details: unsafe/unable; orders for strict bedrest  Stairs            Wheelchair  Mobility    Modified Rankin (Stroke Patients Only)       Balance                                             Pertinent Vitals/Pain Pain Assessment: Faces Faces Pain Scale: No hurt    Home Living                   Additional Comments: Per son, at baseline, lives with wife in [place].  Was visiting son locally when he got sick/had stroke.  Is typically ambulatory at baseline, but is slow and generally weak/unsteady requiring assist from wife for sit/stand at times.  Immediately prior to this admission, [facility], but transferred back to acute hospital due to medical decline.    Prior Function Level of Independence: Needs assistance         Comments: see above     Hand Dominance   Dominant Hand: Right    Extremity/Trunk Assessment   Upper Extremity Assessment Upper Extremity Assessment:  (R UE grossly at least 3-/5 throughout; L UE 0/5 at shoulder, 1/5 at elbow flexion and gross grasp, appears generally insensate (no response to touch/pain) with increased tone throughout)    Lower Extremity Assessment Lower Extremity Assessment:  (R LE grossly at least 3-/5; L LE grossly 0/5, except L great toe ext and L ankle DF 1/5, appears generally insensate to touch, does withdraw to pain, + babinski)  Cervical / Trunk Assessment Cervical / Trunk Assessment:  (maintains position of R lateral flexion and R rotation; unable to fully reposition to midline.  High risk for developing torticollis)  Communication   Communication: Interpreter utilized;Tracheostomy [employer](Language Line utilized [place] #[id number]), however, interpreter and patient/family with different Mandarin dialect; minimally helpful for translation)  Cognition Arousal/Alertness: Lethargic Behavior During Therapy: Flat affect Overall Cognitive Status: Difficult to assess Area of Impairment: Orientation;Following commands;Attention               General Comments: Intermittently  follows demonstration from son (more so to gestures than verbal commands), <50% time    General Comments      Exercises Other Exercises Other Exercises: L UE/LE supine therex, 1x10, for flexibility and strengthening.  L UE with trace movement/resistance intermittently; difficulty to activate/terminate on command. Other Exercises: Attempted to communicate with written language (in Mandarin, written by son)--patient appears to attempt to point at words. May trial basic yes/no communication board in Mandarin if patient demonstrates abiltiy to consistently utilize. Other Exercises: L UE elevated on pillow for edema control/positioning. Other Exercises: Educated son on benefit of visiting/interacting with patient on L side (to promote awareness/integration of L visual field/hemi body).  Son voiced understanding/agreement   Assessment/Plan    PT Assessment Patient needs continued PT services  PT Problem List Decreased strength;Decreased mobility;Decreased range of motion;Decreased coordination;Decreased safety awareness;Impaired tone;Pain;Decreased balance;Decreased activity tolerance;Decreased cognition;Cardiopulmonary status limiting activity;Decreased knowledge of precautions;Decreased skin integrity;Decreased knowledge of use of DME;Impaired sensation          PT Treatment Interventions DME instruction;Therapeutic activities;Gait training;Therapeutic exercise;Patient/family education;Balance training;Neuromuscular re-education;Functional mobility training;Cognitive remediation    PT Goals (Current goals can be found in the Care Plan section)  Acute Rehab PT Goals Patient Stated Goal: per family, to return to STR and to "keep trying" PT Goal Formulation: With patient/family Time For Goal Achievement: 12/10/16 Potential to Achieve Goals: Fair Additional Goals Additional Goal #1: Assess and establish goals for sitting balance, OOB as medically appropriate.    Frequency Min 2X/week    Barriers to discharge Decreased caregiver support      Co-evaluation               End of Session   Activity Tolerance: Patient limited by fatigue Patient left: in bed;with call bell/phone within reach;with family/visitor present;with bed alarm set           Time: 1020-1050 PT Time Calculation (min) (ACUTE ONLY): 30 min   Charges:   PT Evaluation $PT Eval High Complexity: 1 Procedure PT Treatments $Therapeutic Activity: 8-22 mins $Neuromuscular Re-education: 8-22 mins   PT G Codes:        Cipriana Biller H. Manson PasseyBrown, PT, DPT, NCS 11/26/16, 4:31 PM [phone]

## 2016-11-26 NOTE — Plan of Care (Signed)
Problem: Role Relationship: Goal: Ability to communicate will improve Outcome: Not Met (add Reason) Trach. Non-English speaking.

## 2016-11-26 NOTE — Progress Notes (Addendum)
[facility] extended [facility] bed offer for 30 day LOG. Patient's son [name] accepted bed offer and reported that [facility] would go to [facility] and complete admissions paper work. Per son he will also go to DSS to apply for long term care medicaid. Clinical Child psychotherapistocial Worker (CSW) received a call back from [employer] counselor who reported that they are not trained to do long term care medicaid applications or disability for long term care. CSW will continue to follow and assist as needed.   Patient's son in law Ming called CSW. CSW made him aware of above and answered all his questions.   [employer] Hughes [employer]Bailey Ruddy Swire, LCSW [phone]

## 2016-11-27 ENCOUNTER — Inpatient Hospital Stay: Payer: Medicare (Managed Care)

## 2016-11-27 LAB — BASIC METABOLIC PANEL
Anion gap: 5 (ref 5–15)
BUN: 14 mg/dL (ref 6–20)
CALCIUM: 7.5 mg/dL — AB (ref 8.9–10.3)
CHLORIDE: 107 mmol/L (ref 101–111)
CO2: 28 mmol/L (ref 22–32)
Creatinine, Ser: 0.5 mg/dL — ABNORMAL LOW (ref 0.61–1.24)
GFR calc Af Amer: >60 mL/min (ref >60)
Glucose, Bld: 187 mg/dL — ABNORMAL HIGH (ref 65–99)
Potassium: 3.6 mmol/L (ref 3.5–5.1)
Sodium: 140 mmol/L (ref 135–145)

## 2016-11-27 LAB — CBC
HEMATOCRIT: 29.3 % — AB (ref 40.0–52.0)
HEMOGLOBIN: 9.7 g/dL — AB (ref 13.0–18.0)
MCH: 29.3 pg (ref 26.0–34.0)
MCHC: 33.1 g/dL (ref 32.0–36.0)
MCV: 88.7 fL (ref 80.0–100.0)
Platelets: 134 10*3/uL — ABNORMAL LOW (ref 150–440)
RBC: 3.31 MIL/uL — ABNORMAL LOW (ref 4.40–5.90)
RDW: 16.6 % — ABNORMAL HIGH (ref 11.5–14.5)
WBC: 6.3 10*3/uL (ref 3.8–10.6)

## 2016-11-27 LAB — GLUCOSE, CAPILLARY
GLUCOSE-CAPILLARY: 169 mg/dL — AB (ref 65–99)
GLUCOSE-CAPILLARY: 176 mg/dL — AB (ref 65–99)
GLUCOSE-CAPILLARY: 178 mg/dL — AB (ref 65–99)
GLUCOSE-CAPILLARY: 181 mg/dL — AB (ref 65–99)
Glucose-Capillary: 158 mg/dL — ABNORMAL HIGH (ref 65–99)
Glucose-Capillary: 179 mg/dL — ABNORMAL HIGH (ref 65–99)

## 2016-11-27 MED ORDER — AMOXICILLIN-POT CLAVULANATE 875-125 MG PO TABS: 1.0000 | ORAL_TABLET | Freq: Two times a day (BID) | ORAL | 0 refills | Status: AC

## 2016-11-27 MED ORDER — PRO-STAT SUGAR FREE PO LIQD: 30.0000 mL | Freq: Two times a day (BID) | ORAL | Status: DC

## 2016-11-27 NOTE — Progress Notes (Signed)
Pharmacy Antibiotic Note  Sean [name] is a 82 y.o. male admitted on 11/23/2016 with aspiration pneumonia.  Pharmacy has been consulted for vancomycin/Zosyn dosing. Pt received vancomycin 1 g IV x1 at 1504 in the ED and cefepime 2 g IV x1.   Plan: Zosyn 3.375 g IV q8h EI  Height: 5\' 7"  (170.2 cm) Weight: 170 lb (77.1 kg) IBW/kg (Calculated) : 66.1  Temp (24hrs), Avg:98.4 F (36.9 C), Min:98.3 F (36.8 C), Max:98.6 F (37 C)   Recent Labs Lab 11/23/16 1432 11/23/16 1439 11/24/16 0428 11/25/16 0328 11/26/16 0517 11/27/16 0424  WBC  --  9.3 6.7 6.9 6.8 6.3  CREATININE  --  0.86 0.66 0.57* 0.55* 0.50*  LATICACIDVEN 1.8  --   --   --   --   --     Estimated Creatinine Clearance: 59.7 mL/min (by C-G formula based on SCr of 0.5 mg/dL (L)).    No Known Allergies  Antimicrobials this admission: Vancomycin 12/11 >>12/12 Cefepime 12/11 x1 dose Zosyn 12/11 >>  Dose adjustments this admission:   Microbiology results: 12/11 BCx: NGTD 12/11 UCx: NG   Thank you for allowing pharmacy to be a part of this patient's care.  Marty HeckWang, Sulo Janczak L 11/27/2016 1:02 PM

## 2016-11-27 NOTE — Progress Notes (Signed)
Spoke with Denitra, rep with [employer] [phone]) regarding authorization for non-emergent EMS transport.  Per rep, Berkley Harveyauth would not be required per Medicare guidelines.

## 2016-11-27 NOTE — Clinical Social Work Placement (Signed)
   CLINICAL SOCIAL WORK PLACEMENT  NOTE  Date:  11/27/2016  Patient Details  Name: Sean [name] MRN: [id number] Date of Birth: 01-31-1926  Clinical Social Work is seeking post-discharge placement for this patient at the Skilled  Nursing Facility level of care (*CSW will initial, date and re-position this form in  chart as items are completed):  Yes   Patient/family provided with [facility] Clinical Social Work Department's list of facilities offering this level of care within the geographic area requested by the patient (or if unable, by the patient's family).  Yes   Patient/family informed of their freedom to choose among providers that offer the needed level of care, that participate in Medicare, Medicaid or managed care program needed by the patient, have an available bed and are willing to accept the patient.  Yes   Patient/family informed of [facility]'s ownership interest in [facility] and [facility], as well as of the fact that they are under no obligation to receive care at these facilities.  PASRR submitted to EDS on       PASRR number received on       Existing PASRR number confirmed on 11/27/16     FL2 transmitted to all facilities in geographic area requested by pt/family on 11/25/16     FL2 transmitted to all facilities within larger geographic area on       Patient informed that his/her managed care company has contracts with or will negotiate with certain facilities, including the following:        Yes   Patient/family informed of bed offers received.  Patient chooses bed at  [facility]. )     Physician recommends and patient chooses bed at      Patient to be transferred to  [facility] ) on 11/27/16.  Patient to be transferred to facility by  [facility] )     Patient family notified on 11/27/16 of transfer.  Name of family member notified:   (Patient's son [name] is aware of D/C today. )     PHYSICIAN    Additional Comment:    _______________________________________________ Khyre Germond, Darleen CrockerBailey M, LCSW 11/27/2016, 12:57 PM

## 2016-11-27 NOTE — Care Management Important Message (Signed)
Important Message  Patient Details  Name: Sean [name] MRN: [id number] Date of Birth: January 29, 1926   Medicare Important Message Given:  Yes    Gwenette GreetBrenda S Quashon Jesus, RN 11/27/2016, 9:24 AM

## 2016-11-27 NOTE — Discharge Instructions (Signed)
Aspiration Pneumonia °Aspiration pneumonia is an infection in your lungs. It occurs when food, liquid, or stomach contents (vomit) are inhaled (aspirated) into your lungs. When these things get into your lungs, swelling (inflammation) and infection can occur. This can make it difficult for you to breathe. Aspiration pneumonia is a serious condition and can be life threatening. °What increases the risk? °Aspiration pneumonia is more likely to occur when a person's cough (gag) reflex or ability to swallow has been decreased. Some things that can do this include: °· Having a brain injury or disease, such as stroke, seizures, Parkinson's disease, dementia, or amyotrophic lateral sclerosis (ALS). °· Being given general anesthetic for procedures. °· Being in a coma (unconscious). °· Having a narrowing of the tube that carries food to the stomach (esophagus). °· Drinking too much alcohol. If a person passes out and vomits, vomit can be swallowed into the lungs. °· Taking certain medicines, such as tranquilizers or sedatives. ° °What are the signs or symptoms? °· Coughing after swallowing food or liquids. °· Breathing problems, such as wheezing or shortness of breath. °· Bluish skin. This can be caused by lack of oxygen. °· Coughing up food or mucus. The mucus might contain blood, greenish material, or yellowish-white fluid (pus). °· Fever. °· Chest pain. °· Being more tired than usual (fatigue). °· Sweating more than usual. °· Bad breath. °How is this diagnosed? °A physical exam will be done. During the exam, the health care provider will listen to your lungs with a stethoscope to check for: °· Crackling sounds in the lungs. °· Decreased breath sounds. °· A rapid heartbeat. ° °Various tests may be ordered. These may include: °· Chest X-ray. °· CT scan. °· Swallowing study. This test looks at how food is swallowed and whether it goes into your breathing tube (trachea) or food pipe (esophagus). °· Sputum culture. Saliva and  mucus (sputum) are collected from the lungs or the tubes that carry air to the lungs (bronchi). The sputum is then tested for bacteria. °· Bronchoscopy. This test uses a flexible tube (bronchoscope) to see inside the lungs. ° °How is this treated? °Treatment will usually include antibiotic medicines. Other medicines may also be used to reduce fever or pain. You may need to be treated in the hospital. In the hospital, your breathing will be carefully monitored. Depending on how well you are breathing, you may need to be given oxygen, or you may need breathing support from a breathing machine (ventilator). For people who fail a swallowing study, a feeding tube might be placed in the stomach, or they may be asked to avoid certain food textures or liquids when they eat. °Follow these instructions at home: °· Carefully follow any special eating instructions you were given, such as avoiding certain food textures or thickening liquids. This reduces the risk of developing aspiration pneumonia again. °· Only take over-the-counter or prescription medicines as directed by your health care provider. Follow the directions carefully. °· If you were prescribed antibiotics, take them as directed. Finish them even if you start to feel better. °· Rest as instructed by your health care provider. °· Keep all follow-up appointments with your health care provider. °Contact a health care provider if: °· You develop worsening shortness of breath, wheezing, or difficulty breathing. °· You develop a fever. °· You have chest pain. °This information is not intended to replace advice given to you by your health care provider. Make sure you discuss any questions you have with your health care   provider. °Document Released: 09/27/2009 Document Revised: 05/13/2016 Document Reviewed: 05/18/2013 °Elsevier Interactive Patient Education © 2017 [employer] Inc. ° °

## 2016-11-27 NOTE — Progress Notes (Signed)
Nutrition Brief Note   Followed up with pt today. Pt tolerating Tfs well. Continued decreasing creatinine levels. Most recent creat 0.5(L). Will increase prostat to BID. Stable weights. Pt possibly to discharge to SNF in next couple days.  CBGs- 190, 187 x 24hrs   Betsey Holidayasey Tabitha Riggins, RD, LDN

## 2016-11-27 NOTE — Progress Notes (Signed)
MD making rounds. Received order to [facility]. CSW facilitating transfer to facility. IV removed. Report called to Shanda BumpsJessica, nurse at [facility]. No unanswered questions. EMS called for transport. EMS on Unit for transport. Transported via EMS.

## 2016-11-27 NOTE — Progress Notes (Signed)
Patient is medically stable for D[facility]C [facility] today. Clinical Child psychotherapistocial Worker (CSW) Interior and spatial designerdirector approved 30 day LOG. Felicia admissions coordinator [facility] and stated patient can come today to room 202. RN will call report at [phone] and arrange EMS for transport. CSW [facility] D[facility] orders [facility]. CSW contacted patient's son [name] and made him aware of above. Please reconsult if future social work needs arise. CSW signing off.   [employer]Bailey Idil Maslanka, LCSW [phone]

## 2016-11-27 NOTE — Discharge Summary (Addendum)
[facility] - [place] at [facility]   PATIENT NAME: Sean [name]    MR#:  [id number]  DATE OF BIRTH:  Jul 17, 1926  DATE OF ADMISSION:  11/23/2016   ADMITTING PHYSICIAN: Adrian SaranSital Mody, MD  DATE OF DISCHARGE: 11/27/2016  PRIMARY CARE PHYSICIAN: Singh,Jasmine, MD   ADMISSION DIAGNOSIS:  HCAP (healthcare-associated pneumonia) [J18.9] DISCHARGE DIAGNOSIS:  Active Problems:   Sepsis (HCC)   Malnutrition of moderate degree   Palliative care encounter   DNR (do not resuscitate) discussion  SECONDARY DIAGNOSIS:   Past Medical History:  Diagnosis Date  . Atrial fibrillation (HCC)   . Difficult intubation    "doctors say he has a little throat" (11/03/2016)  . History of kidney stones    "removed it; long time ago; don't know how" (11/03/2016)  . Hyperlipidemia   . Hypertension    HOSPITAL COURSE:  82 year old male who is [facility] on December 8 [facility] after 1 month hospitalization with multiple complications and now a tracheostomy and PEG tube admitted with sepsis due to aspiration pneumonia  1. Sepsis: Present on admission with fever and increased respiratory rate on admission due to aspiration pneumonia improved  2. Aspiration pneumonia: Strict Aspiration precautions It should be noted patient due to his CVA and left-sided process and unresponsiveness has high risk of chronic aspiration.  3. Chronic atrial fibrillation: Rate is controlled, continue Eliquis  4. Dysphagia: Patient has PEG and dietary following Abdominal binder in place  5. 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 Continue current anticoagulation  6. Sacral decubitus ulcer: Wound care input apprciated Patient has very poor prognosis.  7. 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 Evolved  from DTI.  Albumin 2.5  Wound type:Stage 3 pressure injury Pressure Ulcer POA: Yes Measurement: 6 cm x 4 cm x 0.3 cm with thin layer yellow adherent slough to half of wound bed.    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  Please use foam dressing under trach to absorb exudate and pad this area Wound GMW:NUUVbed:pink, moist some devitalized tissue.  Drainage (amount, consistency, odor) minimal serosanguinous  No odor.  Periwound:Intact Dressing procedure/placement/frequency:Mattress with low air loss replacement.  Cleanse sacral wound with NS and pat gently dry.  Apply Santyl to wound bed. Cover with NS moist gauze.  Secure with 4x4 gauze and ABD pad/tape.  Change daily.  Offload heels with PRevalon boots Foam dressing to trach site.  Change daily.  Turn and reposition every two hours.   7.  Moderate protein calorie malnutrition  8.  Hypokalemia - Repleted  DISCHARGE CONDITIONS:  fair CONSULTS OBTAINED:   DRUG ALLERGIES:  No Known Allergies DISCHARGE MEDICATIONS:   Allergies as of 11/27/2016   No Known Allergies     Medication List    STOP taking these medications   cefTRIAXone 1 g injection Commonly known as:  ROCEPHIN   ciprofloxacin 0.3 % ophthalmic solution Commonly known as:  CILOXAN     TAKE these medications   amoxicillin-clavulanate 875-125 MG tablet Commonly known as:  AUGMENTIN Take 1 tablet by mouth 2 (two) times daily.   apixaban 5 MG Tabs tablet Commonly known as:  ELIQUIS Place 1 tablet (5 mg total) into feeding tube 2 (two) times daily.   bacitracin-polymyxin b ophthalmic ointment Commonly known as:  POLYSPORIN Place into the  right eye at bedtime. apply to eye every 12 hours while awake   bisacodyl 10 MG suppository Commonly known as:  DULCOLAX Place 1 suppository (10 mg total) rectally daily as needed for moderate constipation.   feeding supplement (JEVITY 1.2 CAL) Liqd Place 1,000 mLs into feeding tube continuous.   feeding  supplement (PRO-STAT SUGAR FREE 64) Liqd Place 30 mLs into feeding tube 3 (three) times daily.   free water Soln Place 100 mLs into feeding tube every 8 (eight) hours.   furosemide 20 MG tablet Commonly known as:  LASIX Take 1 tablet (20 mg total) by mouth daily.   hydrALAZINE 50 MG tablet Commonly known as:  APRESOLINE Place 1 tablet (50 mg total) into feeding tube every 8 (eight) hours. What changed:  when to take this   ipratropium-albuterol 0.5-2.5 (3) MG/3ML Soln Commonly known as:  DUONEB Take 3 mLs by nebulization every 4 (four) hours.   pantoprazole sodium 40 mg/20 mL Pack Commonly known as:  PROTONIX Place 20 mLs (40 mg total) into feeding tube daily.   TUBERCULIN PPD ID Inject 0.1 mLs into the skin every 7 (seven) days.        DISCHARGE INSTRUCTIONS:  Dressing procedure/placement/frequency:Mattress with low air loss replacement.  Cleanse sacral wound with NS and pat gently dry.  Apply Santyl to wound bed. Cover with NS moist gauze.  Secure with 4x4 gauze and ABD pad/tape.  Change daily.  Offload heels with Prevalon boots Foam dressing to trach site.  Change daily.  Turn and reposition every two hours.  DIET:  TFs DISCHARGE CONDITION:  Fair ACTIVITY:  Activity as tolerated OXYGEN:  Home Oxygen: No.  Oxygen Delivery: room air, trach collar  DISCHARGE LOCATION:  nursing home   If you experience worsening of your admission symptoms, develop shortness of breath, life threatening emergency, suicidal or homicidal thoughts you must seek medical attention immediately by calling 911 or calling your MD immediately  if symptoms less severe.  You Must read complete instructions/literature along with all the possible adverse reactions/side effects for all the Medicines you take and that have been prescribed to you. Take any new Medicines after you have completely understood and accpet all the possible adverse reactions/side effects.   Please note  You were cared for  by a hospitalist during your hospital stay. If you have any questions about your discharge medications or the care you received while you were in the hospital after you are discharged, you can call the unit and asked to speak with the hospitalist on call if the hospitalist that took care of you is not available. Once you are discharged, your primary care physician will handle any further medical issues. Please note that NO REFILLS for any discharge medications will be authorized once you are discharged, as it is imperative that you return to your primary care physician (or establish a relationship with a primary care physician if you do not have one) for your aftercare needs so that they can reassess your need for medications and monitor your lab values.    On the day of Discharge:  VITAL SIGNS:  Blood pressure 139/71, pulse 82, temperature 98.3 F (36.8 C), temperature source Axillary, resp. rate 20, height 5\' 7"  (1.702 m), weight 77.1 kg (170 lb), SpO2 100 %. PHYSICAL EXAMINATION:  GENERAL:  82 y.o.-year-old patient lying in the bed with no acute distress.  Constitutional: He appears ill.  Cardiovascular: Regular rhythm.  Exam reveals distant heart sounds.   Pulmonary/Chest: He has rhonchi.  Trach/trach collar 28%  Abdominal: Soft. Bowel sounds are normal.  Musculoskeletal: He exhibits edema (generalized/LUE).  Neurological: He is alert.  Skin: Skin is warm and dry. There is pallor. Stage 3 pressure ulcer at trach site Psychiatric: He has a normal mood and affect. His behavior is normal. He is inattentive.  DATA REVIEW:   CBC  Recent Labs Lab 11/27/16 0424  WBC 6.3  HGB 9.7*  HCT 29.3*  PLT 134*    Chemistries   Recent Labs Lab 11/23/16 1439  11/27/16 0424  NA 142  < > 140  K 4.2  < > 3.6  CL 105  < > 107  CO2 29  < > 28  GLUCOSE 198*  < > 187*  BUN 31*  < > 14  CREATININE 0.86  < > 0.50*  CALCIUM 8.2*  < > 7.5*  AST 35  --   --   ALT 25  --   --   ALKPHOS 66  --    --   BILITOT 0.9  --   --   < > = values in this interval not displayed.    Contact information for follow-up providers    Singh,Jasmine, MD. Schedule an appointment as soon as possible for a visit in 1 week(s).   Specialty:  Internal Medicine Contact information: [address] [facility] [place] [address] [phone]            Contact information for after-discharge care    Destination    [facility] Follow up.   Specialty:  Skilled Nursing Facility Contact information: [address] [place] [address] [phone]                  RADIOLOGY:  Dg Chest Port 1 View  Result Date: 11/27/2016 CLINICAL DATA:  Shortness of breath. EXAM: PORTABLE CHEST 1 VIEW COMPARISON:  11/23/2016 FINDINGS: Tracheostomy tube overlies the airway. Accentuation of the cardiomediastinal contours is felt to be secondary to portable AP technique, the mildly lordotic positioning, and rightward patient rotation. There is persistent cardiomegaly with mild pulmonary vascular congestion which is new/ increased from prior. No overt alveolar edema, sizable pleural effusion, or pneumothorax is identified. No acute osseous abnormality is seen. IMPRESSION: Cardiomegaly and mild pulmonary vascular congestion. Electronically Signed   By: Sebastian Ache M.D.   On: 11/27/2016 09:58     Management plans discussed with the patient, family and they are in agreement.  CODE STATUS: full code   TOTAL TIME TAKING CARE OF THIS PATIENT: 45 minutes.    Delfino Lovett M.D on 11/27/2016 at 11:51 AM  Between 7am to 6pm - Pager - [phone]  After 6pm go to [email or web address] - Social research officer, government  [facility] [place] [facility]  Office  [phone]  CC: Primary care physician; Leotis Shames, MD   Note: This dictation was prepared with Dragon dictation along with smaller phrase technology. Any transcriptional errors that result from this process  are unintentional.

## 2016-11-27 NOTE — Progress Notes (Signed)
Humidification bottle refilled to tracheostomy collar. Pt tol well.

## 2016-11-28 LAB — CULTURE, BLOOD (ROUTINE X 2)
CULTURE: NO GROWTH
Culture: NO GROWTH

## 2017-02-25 ENCOUNTER — Other Ambulatory Visit: Payer: Self-pay

## 2017-02-25 NOTE — Patient Outreach (Signed)
Telephone outreach to patient to obtain mRS was successfully completed by [facility] RN [facility] [place]. mRS = 5  Sherle PoeNicole Lessly Stigler, Conrad BurlingtonB.A.  [facility] Care Management Assistant

## 2017-03-21 DIAGNOSIS — A419 Sepsis, unspecified organism: Secondary | ICD-10-CM | POA: Diagnosis not present

## 2017-03-21 DIAGNOSIS — N139 Obstructive and reflux uropathy, unspecified: Secondary | ICD-10-CM | POA: Diagnosis not present

## 2017-03-21 DIAGNOSIS — E860 Dehydration: Secondary | ICD-10-CM | POA: Diagnosis not present

## 2017-03-21 DIAGNOSIS — G9341 Metabolic encephalopathy: Secondary | ICD-10-CM | POA: Diagnosis not present

## 2017-03-21 DIAGNOSIS — I4891 Unspecified atrial fibrillation: Secondary | ICD-10-CM

## 2017-03-21 DIAGNOSIS — J189 Pneumonia, unspecified organism: Secondary | ICD-10-CM

## 2017-03-21 DIAGNOSIS — N179 Acute kidney failure, unspecified: Secondary | ICD-10-CM

## 2017-03-22 DIAGNOSIS — I69391 Dysphagia following cerebral infarction: Secondary | ICD-10-CM

## 2017-03-22 DIAGNOSIS — Z8673 Personal history of transient ischemic attack (TIA), and cerebral infarction without residual deficits: Secondary | ICD-10-CM

## 2017-03-22 DIAGNOSIS — N139 Obstructive and reflux uropathy, unspecified: Secondary | ICD-10-CM | POA: Diagnosis not present

## 2017-03-22 DIAGNOSIS — N400 Enlarged prostate without lower urinary tract symptoms: Secondary | ICD-10-CM

## 2017-03-22 DIAGNOSIS — R319 Hematuria, unspecified: Secondary | ICD-10-CM

## 2017-03-22 DIAGNOSIS — A419 Sepsis, unspecified organism: Secondary | ICD-10-CM | POA: Diagnosis not present

## 2017-03-22 DIAGNOSIS — N179 Acute kidney failure, unspecified: Secondary | ICD-10-CM | POA: Diagnosis not present

## 2017-03-22 DIAGNOSIS — I10 Essential (primary) hypertension: Secondary | ICD-10-CM | POA: Diagnosis not present

## 2017-03-22 DIAGNOSIS — G9341 Metabolic encephalopathy: Secondary | ICD-10-CM | POA: Diagnosis not present

## 2017-03-23 DIAGNOSIS — A419 Sepsis, unspecified organism: Secondary | ICD-10-CM | POA: Diagnosis not present

## 2017-03-23 DIAGNOSIS — N139 Obstructive and reflux uropathy, unspecified: Secondary | ICD-10-CM | POA: Diagnosis not present

## 2017-03-23 DIAGNOSIS — N179 Acute kidney failure, unspecified: Secondary | ICD-10-CM | POA: Diagnosis not present

## 2017-03-23 DIAGNOSIS — I10 Essential (primary) hypertension: Secondary | ICD-10-CM | POA: Diagnosis not present

## 2017-03-23 DIAGNOSIS — G9341 Metabolic encephalopathy: Secondary | ICD-10-CM | POA: Diagnosis not present

## 2017-03-24 DIAGNOSIS — N179 Acute kidney failure, unspecified: Secondary | ICD-10-CM | POA: Diagnosis not present

## 2017-03-24 DIAGNOSIS — N139 Obstructive and reflux uropathy, unspecified: Secondary | ICD-10-CM | POA: Diagnosis not present

## 2017-03-24 DIAGNOSIS — I10 Essential (primary) hypertension: Secondary | ICD-10-CM | POA: Diagnosis not present

## 2017-03-24 DIAGNOSIS — N400 Enlarged prostate without lower urinary tract symptoms: Secondary | ICD-10-CM | POA: Diagnosis not present

## 2017-03-24 DIAGNOSIS — Z8673 Personal history of transient ischemic attack (TIA), and cerebral infarction without residual deficits: Secondary | ICD-10-CM | POA: Diagnosis not present

## 2017-03-24 DIAGNOSIS — G9341 Metabolic encephalopathy: Secondary | ICD-10-CM | POA: Diagnosis not present

## 2017-03-24 DIAGNOSIS — A419 Sepsis, unspecified organism: Secondary | ICD-10-CM | POA: Diagnosis not present

## 2017-03-24 DIAGNOSIS — E860 Dehydration: Secondary | ICD-10-CM | POA: Diagnosis not present

## 2017-03-25 DIAGNOSIS — G9341 Metabolic encephalopathy: Secondary | ICD-10-CM | POA: Diagnosis not present

## 2017-03-25 DIAGNOSIS — I10 Essential (primary) hypertension: Secondary | ICD-10-CM | POA: Diagnosis not present

## 2017-03-25 DIAGNOSIS — L89152 Pressure ulcer of sacral region, stage 2: Secondary | ICD-10-CM

## 2017-03-25 DIAGNOSIS — N179 Acute kidney failure, unspecified: Secondary | ICD-10-CM | POA: Diagnosis not present

## 2017-03-25 DIAGNOSIS — N139 Obstructive and reflux uropathy, unspecified: Secondary | ICD-10-CM | POA: Diagnosis not present

## 2017-03-25 DIAGNOSIS — A419 Sepsis, unspecified organism: Secondary | ICD-10-CM | POA: Diagnosis not present

## 2017-08-10 DIAGNOSIS — N400 Enlarged prostate without lower urinary tract symptoms: Secondary | ICD-10-CM

## 2017-08-10 DIAGNOSIS — J189 Pneumonia, unspecified organism: Secondary | ICD-10-CM

## 2017-08-10 DIAGNOSIS — I69391 Dysphagia following cerebral infarction: Secondary | ICD-10-CM | POA: Diagnosis not present

## 2017-08-10 DIAGNOSIS — I10 Essential (primary) hypertension: Secondary | ICD-10-CM | POA: Diagnosis not present

## 2017-08-10 DIAGNOSIS — I4891 Unspecified atrial fibrillation: Secondary | ICD-10-CM

## 2017-08-10 DIAGNOSIS — Z8673 Personal history of transient ischemic attack (TIA), and cerebral infarction without residual deficits: Secondary | ICD-10-CM | POA: Diagnosis not present

## 2017-08-10 DIAGNOSIS — E870 Hyperosmolality and hypernatremia: Secondary | ICD-10-CM

## 2017-08-11 DIAGNOSIS — I69391 Dysphagia following cerebral infarction: Secondary | ICD-10-CM | POA: Diagnosis not present

## 2017-08-11 DIAGNOSIS — I10 Essential (primary) hypertension: Secondary | ICD-10-CM | POA: Diagnosis not present

## 2017-08-11 DIAGNOSIS — J189 Pneumonia, unspecified organism: Secondary | ICD-10-CM | POA: Diagnosis not present

## 2017-08-11 DIAGNOSIS — N400 Enlarged prostate without lower urinary tract symptoms: Secondary | ICD-10-CM | POA: Diagnosis not present

## 2017-08-11 DIAGNOSIS — I4891 Unspecified atrial fibrillation: Secondary | ICD-10-CM | POA: Diagnosis not present

## 2017-08-11 DIAGNOSIS — R0602 Shortness of breath: Secondary | ICD-10-CM | POA: Diagnosis not present

## 2017-08-11 DIAGNOSIS — E870 Hyperosmolality and hypernatremia: Secondary | ICD-10-CM | POA: Diagnosis not present

## 2017-08-11 DIAGNOSIS — Z8673 Personal history of transient ischemic attack (TIA), and cerebral infarction without residual deficits: Secondary | ICD-10-CM | POA: Diagnosis not present

## 2017-08-12 DIAGNOSIS — E870 Hyperosmolality and hypernatremia: Secondary | ICD-10-CM | POA: Diagnosis not present

## 2017-08-12 DIAGNOSIS — I4891 Unspecified atrial fibrillation: Secondary | ICD-10-CM | POA: Diagnosis not present

## 2017-08-12 DIAGNOSIS — I69391 Dysphagia following cerebral infarction: Secondary | ICD-10-CM | POA: Diagnosis not present

## 2017-08-12 DIAGNOSIS — J189 Pneumonia, unspecified organism: Secondary | ICD-10-CM | POA: Diagnosis not present

## 2017-08-12 DIAGNOSIS — I10 Essential (primary) hypertension: Secondary | ICD-10-CM | POA: Diagnosis not present

## 2017-08-12 DIAGNOSIS — Z8673 Personal history of transient ischemic attack (TIA), and cerebral infarction without residual deficits: Secondary | ICD-10-CM | POA: Diagnosis not present

## 2017-08-12 DIAGNOSIS — N400 Enlarged prostate without lower urinary tract symptoms: Secondary | ICD-10-CM | POA: Diagnosis not present

## 2017-08-13 DIAGNOSIS — N400 Enlarged prostate without lower urinary tract symptoms: Secondary | ICD-10-CM | POA: Diagnosis not present

## 2017-08-13 DIAGNOSIS — I69391 Dysphagia following cerebral infarction: Secondary | ICD-10-CM | POA: Diagnosis not present

## 2017-08-13 DIAGNOSIS — I4891 Unspecified atrial fibrillation: Secondary | ICD-10-CM | POA: Diagnosis not present

## 2017-08-13 DIAGNOSIS — E870 Hyperosmolality and hypernatremia: Secondary | ICD-10-CM | POA: Diagnosis not present

## 2017-08-13 DIAGNOSIS — I10 Essential (primary) hypertension: Secondary | ICD-10-CM | POA: Diagnosis not present

## 2017-08-13 DIAGNOSIS — Z8673 Personal history of transient ischemic attack (TIA), and cerebral infarction without residual deficits: Secondary | ICD-10-CM | POA: Diagnosis not present

## 2017-08-13 DIAGNOSIS — J189 Pneumonia, unspecified organism: Secondary | ICD-10-CM | POA: Diagnosis not present

## 2017-10-09 IMAGING — CR DG CHEST 1V PORT
1 series · 1 of 1 positions shown · non-contrast
Comparison: 10/27/2016 and 10/25/2016 and 10/23/2016 and 01/29/2006

CLINICAL DATA: Acute respiratory failure with hypoxia.  Ventilator.

EXAM:
PORTABLE CHEST 1 VIEW

[AP]
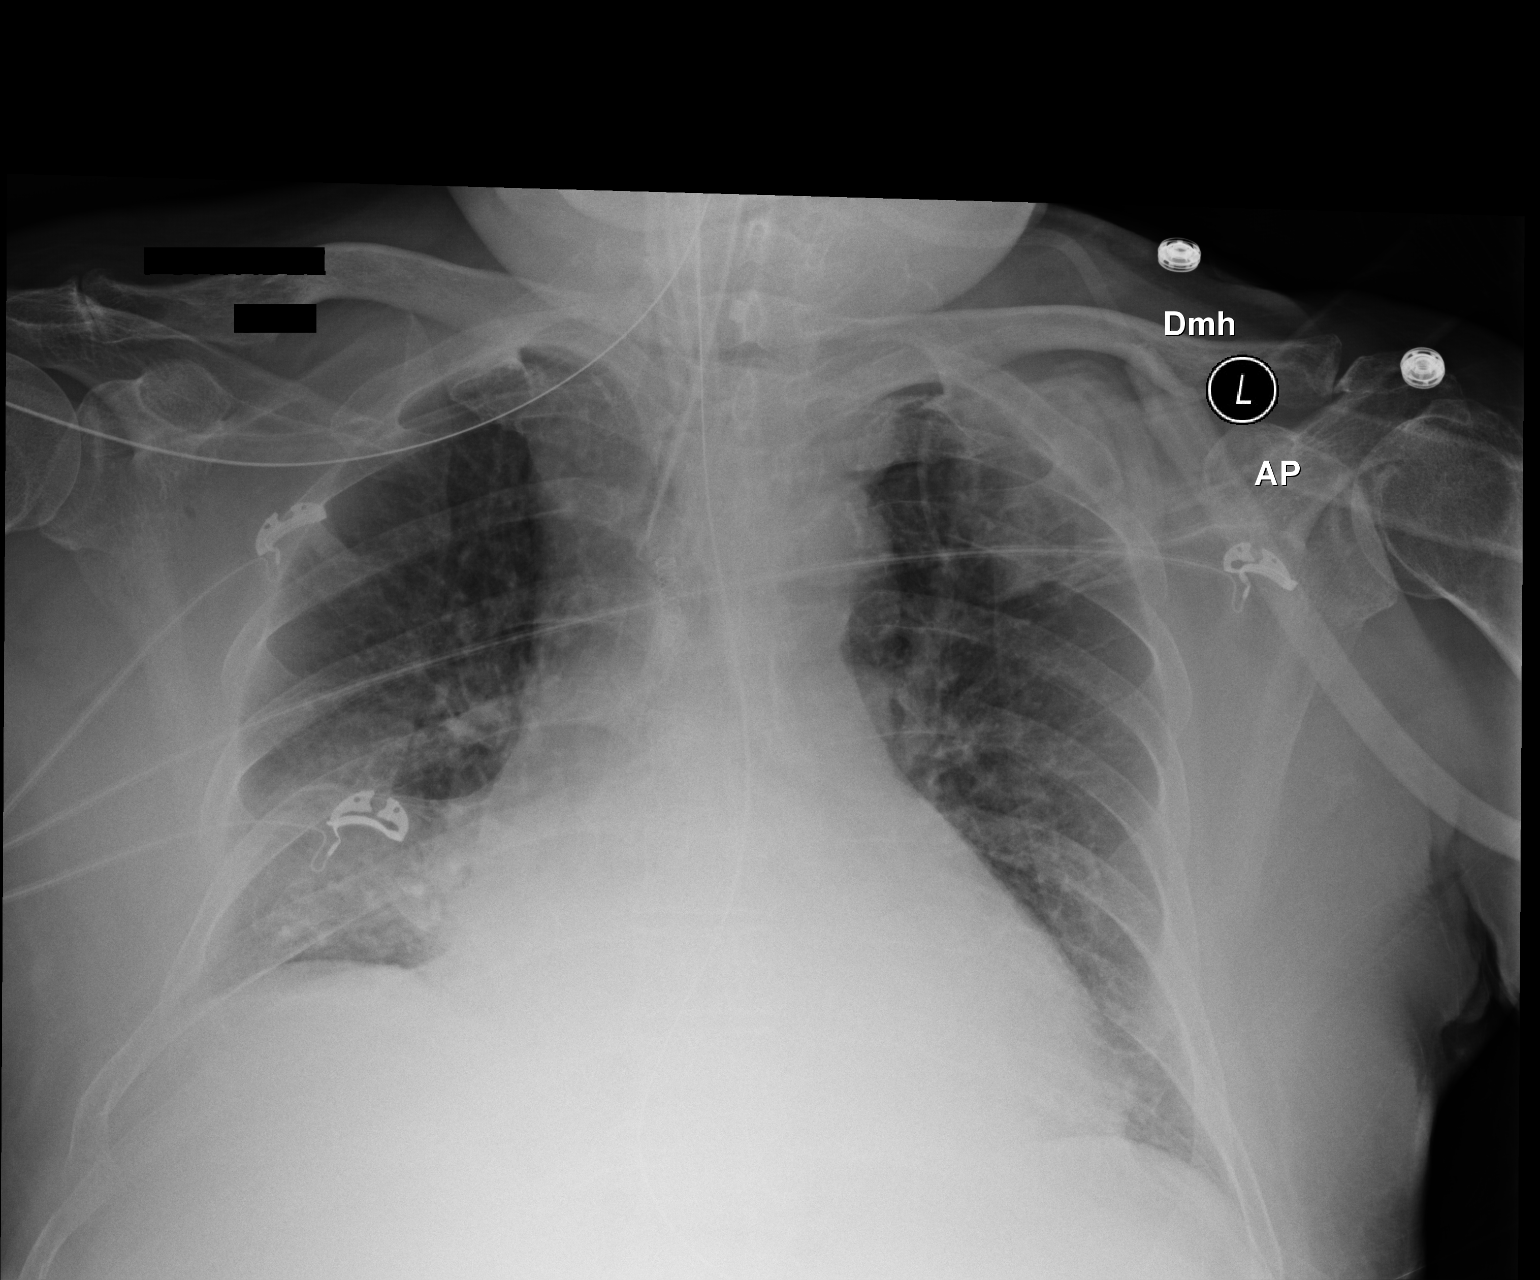

[1 of 1 positions shown; findings below may reference images not displayed]

FINDINGS: Endotracheal tube and NG tube appear in good position. Minimal
effusion at the right base, more apparent than on the prior study.
Slight atelectasis at the left base, unchanged. Pulmonary
vascularity is normal. Heart size is stable.
IMPRESSION: Small residual right effusion. Persistent slight atelectasis at the
left base. No significant

## 2017-10-12 IMAGING — CR DG CHEST 1V PORT
1 series · 1 of 1 positions shown · non-contrast
Comparison: Two days ago

CLINICAL DATA: Respiratory failure

EXAM:
PORTABLE CHEST 1 VIEW

[AP]
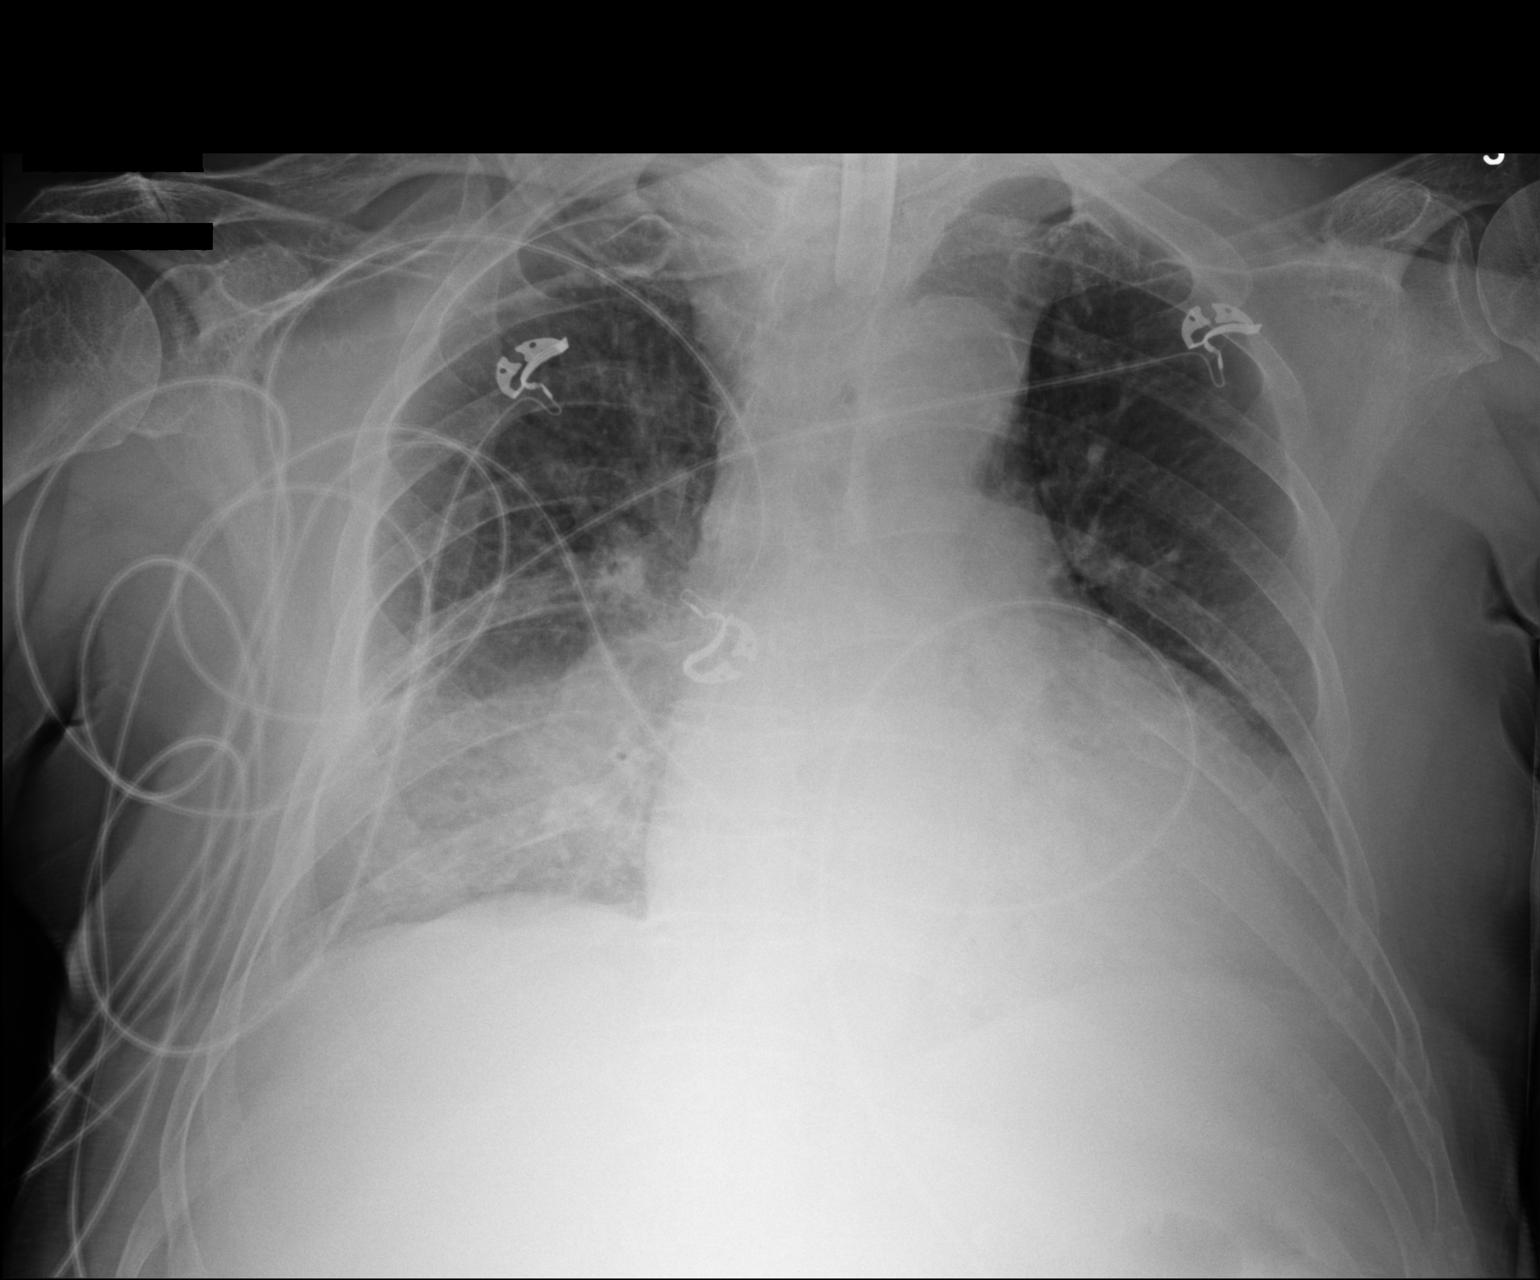

[1 of 1 positions shown; findings below may reference images not displayed]

FINDINGS: Both lower lobes were likely collapsed previously, improved
currently although there is still hazy opacity. No generalized
edema. No effusion or pneumothorax. Chronic cardiopericardial
enlargement.

Tracheostomy tube is well seated. Artifact from EKG leads over the
chest.
IMPRESSION: Interval re- inflation of the lower lobes. Hazy opacity over the
bases could be residual atelectasis or infection.

## 2017-10-13 IMAGING — CR DG CHEST 1V PORT
1 series · 1 of 1 positions shown · non-contrast
Comparison: 1 day prior

CLINICAL DATA: Respiratory failure

EXAM:
PORTABLE CHEST 1 VIEW

[AP]
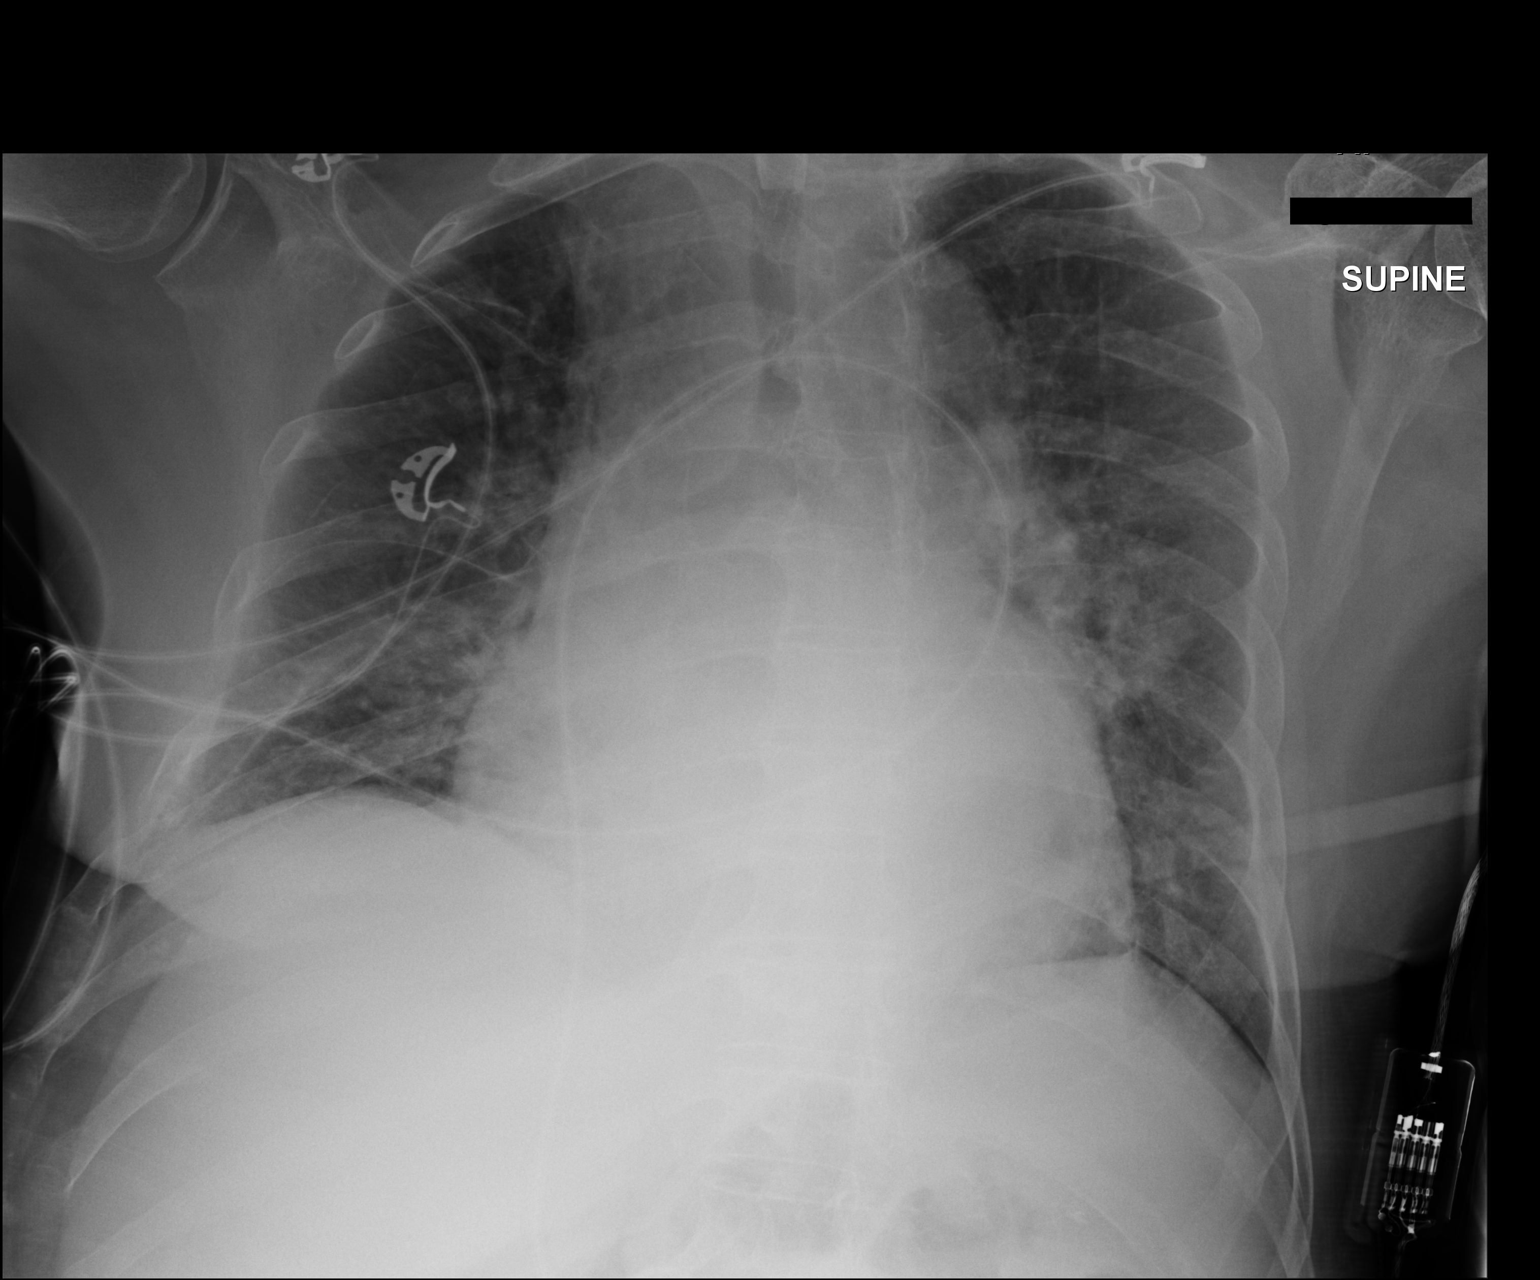

[1 of 1 positions shown; findings below may reference images not displayed]

FINDINGS: Tracheostomy appropriately positioned. Cardiomegaly accentuated by
AP portable technique. Superior mediastinal soft tissue fullness is
new and favored to be due to AP portable technique. No pleural
effusion or pneumothorax. Shifting airspace opacities. Improved
right infrahilar aeration with developing left perihilar and
persistent left lower lobe airspace disease.
IMPRESSION: Cardiomegaly with shifting airspace opacities, favoring alveolar
edema. Multifocal infection could look similar.

## 2017-10-14 IMAGING — CR DG CHEST 1V PORT
1 series · 1 of 1 positions shown · non-contrast
Comparison: none

CLINICAL DATA: Pneumonia.

EXAM:
PORTABLE CHEST 1 VIEW

[AP]
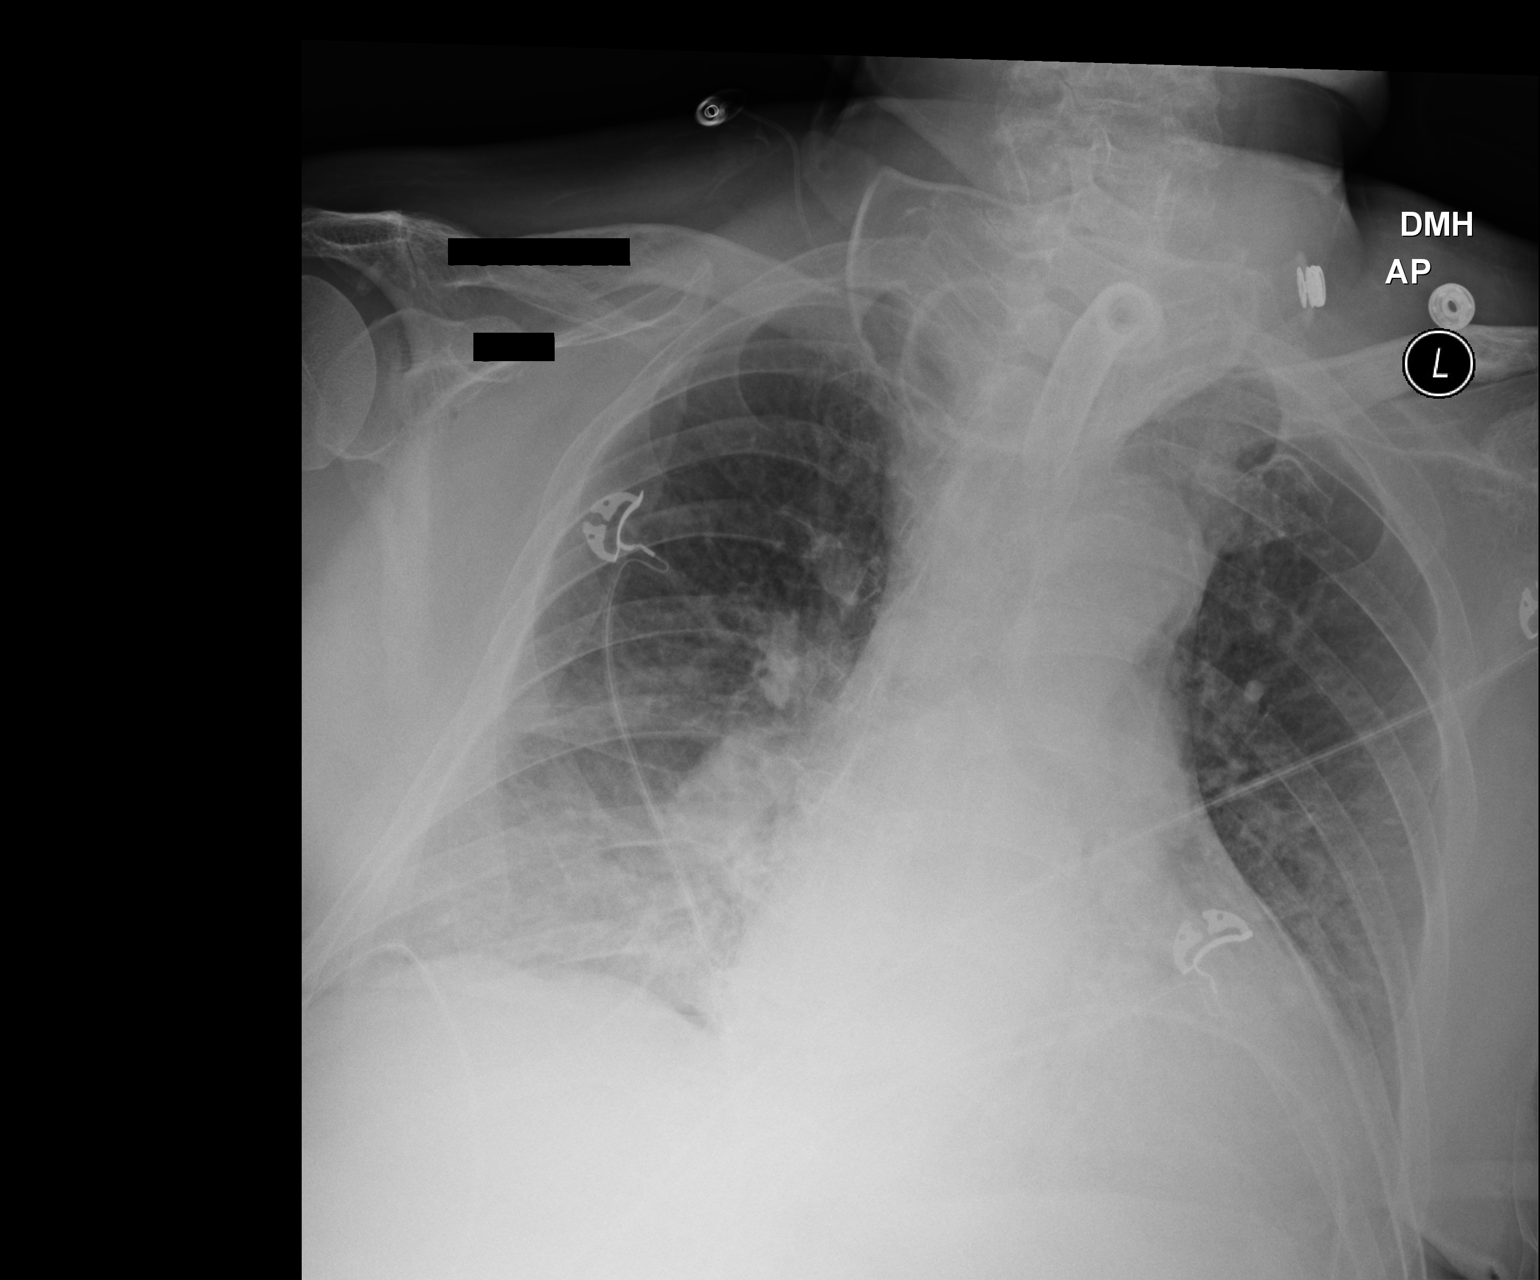

[1 of 1 positions shown; findings below may reference images not displayed]

FINDINGS: Tracheostomy tube noted good anatomic position. Cardiomegaly.
Bilateral pulmonary infiltrates noted suggesting a component of
congestive heart failure. Superimposed pneumonia cannot be excluded.
Small pleural effusions cannot be excluded.
IMPRESSION: 1. Tracheostomy tube in stable position.

2. Cardiomegaly with diffuse bilateral pulmonary interstitial
prominence, increased from prior exam. Findings suggest congestive
heart failure. Superimposed pneumonia cannot be excluded . Small
bilateral pleural effusions cannot be excluded .

## 2017-10-14 IMAGING — CR DG CHEST 1V PORT
1 series · 1 of 1 positions shown · non-contrast
Comparison: 11/02/2016

CLINICAL DATA: Tracheostomy

EXAM:
PORTABLE CHEST 1 VIEW

[AP]
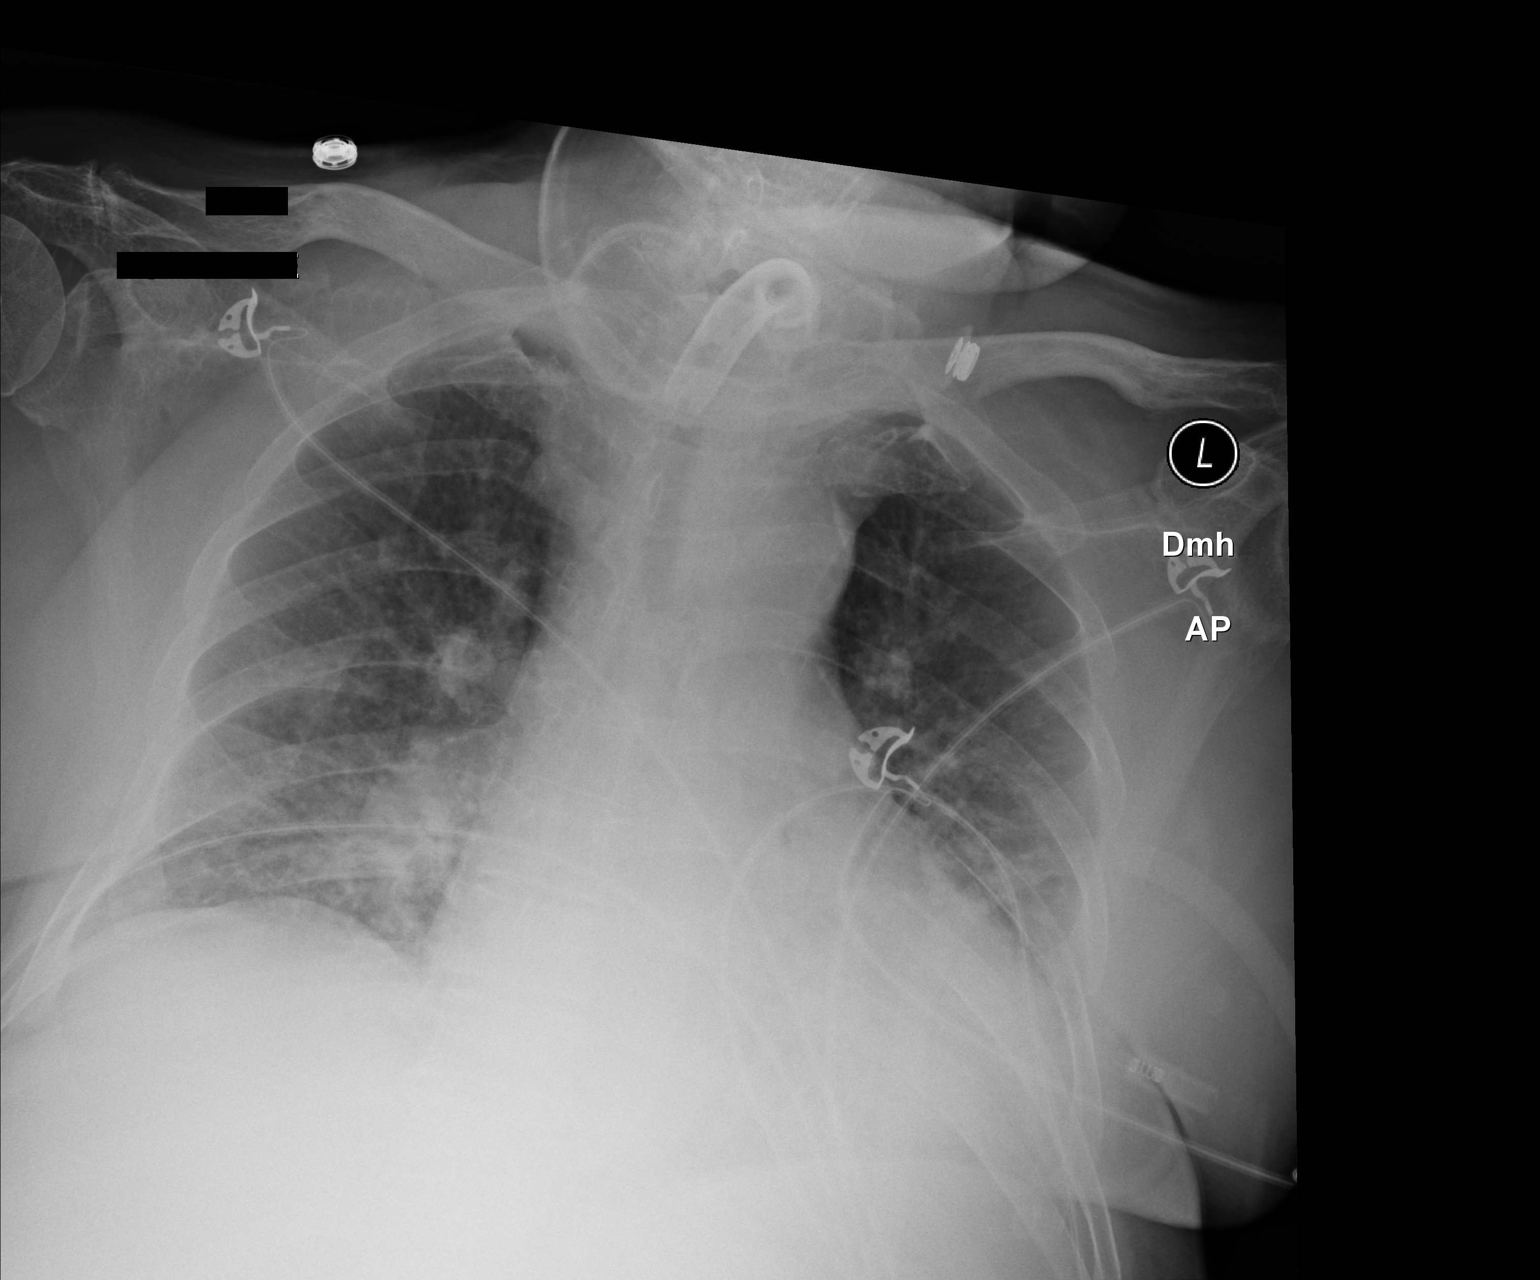

[1 of 1 positions shown; findings below may reference images not displayed]

FINDINGS: Cardiomediastinal silhouette is stable. Tracheostomy tube is
unchanged in position. Central mild vascular congestion. Residual
mild interstitial prominence bilateral lower lobes probable
improving edema. No segmental infiltrate.
IMPRESSION: Tracheostomy tube is unchanged in position. Central mild vascular
congestion. Residual mild interstitial prominence bilateral lower
lobes probable improving edema. No segmental infiltrate.

## 2017-11-22 DIAGNOSIS — I69391 Dysphagia following cerebral infarction: Secondary | ICD-10-CM | POA: Diagnosis not present

## 2017-11-22 DIAGNOSIS — I4891 Unspecified atrial fibrillation: Secondary | ICD-10-CM | POA: Diagnosis not present

## 2017-11-22 DIAGNOSIS — J189 Pneumonia, unspecified organism: Secondary | ICD-10-CM | POA: Diagnosis not present

## 2017-11-22 DIAGNOSIS — I10 Essential (primary) hypertension: Secondary | ICD-10-CM | POA: Diagnosis not present

## 2017-11-22 DIAGNOSIS — R0609 Other forms of dyspnea: Secondary | ICD-10-CM

## 2017-11-22 DIAGNOSIS — N400 Enlarged prostate without lower urinary tract symptoms: Secondary | ICD-10-CM

## 2017-11-22 DIAGNOSIS — Z8673 Personal history of transient ischemic attack (TIA), and cerebral infarction without residual deficits: Secondary | ICD-10-CM | POA: Diagnosis not present

## 2017-11-23 DIAGNOSIS — I69391 Dysphagia following cerebral infarction: Secondary | ICD-10-CM | POA: Diagnosis not present

## 2017-11-23 DIAGNOSIS — Z8673 Personal history of transient ischemic attack (TIA), and cerebral infarction without residual deficits: Secondary | ICD-10-CM | POA: Diagnosis not present

## 2017-11-23 DIAGNOSIS — I4891 Unspecified atrial fibrillation: Secondary | ICD-10-CM | POA: Diagnosis not present

## 2017-11-23 DIAGNOSIS — I10 Essential (primary) hypertension: Secondary | ICD-10-CM | POA: Diagnosis not present

## 2017-11-23 DIAGNOSIS — N400 Enlarged prostate without lower urinary tract symptoms: Secondary | ICD-10-CM | POA: Diagnosis not present

## 2017-11-23 DIAGNOSIS — R0609 Other forms of dyspnea: Secondary | ICD-10-CM | POA: Diagnosis not present

## 2017-11-23 DIAGNOSIS — J189 Pneumonia, unspecified organism: Secondary | ICD-10-CM | POA: Diagnosis not present

## 2017-11-24 DIAGNOSIS — Z8673 Personal history of transient ischemic attack (TIA), and cerebral infarction without residual deficits: Secondary | ICD-10-CM | POA: Diagnosis not present

## 2017-11-24 DIAGNOSIS — I4891 Unspecified atrial fibrillation: Secondary | ICD-10-CM | POA: Diagnosis not present

## 2017-11-24 DIAGNOSIS — J189 Pneumonia, unspecified organism: Secondary | ICD-10-CM | POA: Diagnosis not present

## 2017-11-24 DIAGNOSIS — N400 Enlarged prostate without lower urinary tract symptoms: Secondary | ICD-10-CM | POA: Diagnosis not present

## 2017-11-24 DIAGNOSIS — I10 Essential (primary) hypertension: Secondary | ICD-10-CM | POA: Diagnosis not present

## 2017-11-24 DIAGNOSIS — R0609 Other forms of dyspnea: Secondary | ICD-10-CM | POA: Diagnosis not present

## 2017-11-24 DIAGNOSIS — I69391 Dysphagia following cerebral infarction: Secondary | ICD-10-CM | POA: Diagnosis not present

## 2017-11-25 DIAGNOSIS — I4891 Unspecified atrial fibrillation: Secondary | ICD-10-CM | POA: Diagnosis not present

## 2017-11-25 DIAGNOSIS — Z8673 Personal history of transient ischemic attack (TIA), and cerebral infarction without residual deficits: Secondary | ICD-10-CM | POA: Diagnosis not present

## 2017-11-25 DIAGNOSIS — R0609 Other forms of dyspnea: Secondary | ICD-10-CM | POA: Diagnosis not present

## 2017-11-25 DIAGNOSIS — I69391 Dysphagia following cerebral infarction: Secondary | ICD-10-CM | POA: Diagnosis not present

## 2017-11-25 DIAGNOSIS — I10 Essential (primary) hypertension: Secondary | ICD-10-CM | POA: Diagnosis not present

## 2017-11-25 DIAGNOSIS — N400 Enlarged prostate without lower urinary tract symptoms: Secondary | ICD-10-CM | POA: Diagnosis not present

## 2017-11-25 DIAGNOSIS — J189 Pneumonia, unspecified organism: Secondary | ICD-10-CM | POA: Diagnosis not present

## 2018-01-01 DIAGNOSIS — I4891 Unspecified atrial fibrillation: Secondary | ICD-10-CM

## 2018-01-01 DIAGNOSIS — R509 Fever, unspecified: Secondary | ICD-10-CM

## 2018-01-01 DIAGNOSIS — A419 Sepsis, unspecified organism: Secondary | ICD-10-CM

## 2018-01-01 DIAGNOSIS — J181 Lobar pneumonia, unspecified organism: Secondary | ICD-10-CM | POA: Diagnosis not present

## 2018-01-01 DIAGNOSIS — I10 Essential (primary) hypertension: Secondary | ICD-10-CM | POA: Diagnosis not present

## 2018-01-02 DIAGNOSIS — I10 Essential (primary) hypertension: Secondary | ICD-10-CM | POA: Diagnosis not present

## 2018-01-02 DIAGNOSIS — I4891 Unspecified atrial fibrillation: Secondary | ICD-10-CM | POA: Diagnosis not present

## 2018-01-02 DIAGNOSIS — R509 Fever, unspecified: Secondary | ICD-10-CM | POA: Diagnosis not present

## 2018-01-02 DIAGNOSIS — J181 Lobar pneumonia, unspecified organism: Secondary | ICD-10-CM | POA: Diagnosis not present

## 2018-01-02 DIAGNOSIS — A419 Sepsis, unspecified organism: Secondary | ICD-10-CM | POA: Diagnosis not present

## 2018-01-03 DIAGNOSIS — I4891 Unspecified atrial fibrillation: Secondary | ICD-10-CM | POA: Diagnosis not present

## 2018-01-03 DIAGNOSIS — I10 Essential (primary) hypertension: Secondary | ICD-10-CM | POA: Diagnosis not present

## 2018-01-03 DIAGNOSIS — J181 Lobar pneumonia, unspecified organism: Secondary | ICD-10-CM | POA: Diagnosis not present

## 2018-01-03 DIAGNOSIS — A419 Sepsis, unspecified organism: Secondary | ICD-10-CM | POA: Diagnosis not present

## 2018-01-03 DIAGNOSIS — R509 Fever, unspecified: Secondary | ICD-10-CM | POA: Diagnosis not present

## 2018-01-04 DIAGNOSIS — R509 Fever, unspecified: Secondary | ICD-10-CM | POA: Diagnosis not present

## 2018-01-04 DIAGNOSIS — A419 Sepsis, unspecified organism: Secondary | ICD-10-CM | POA: Diagnosis not present

## 2018-01-04 DIAGNOSIS — I4891 Unspecified atrial fibrillation: Secondary | ICD-10-CM | POA: Diagnosis not present

## 2018-01-04 DIAGNOSIS — I10 Essential (primary) hypertension: Secondary | ICD-10-CM | POA: Diagnosis not present

## 2018-01-04 DIAGNOSIS — J181 Lobar pneumonia, unspecified organism: Secondary | ICD-10-CM | POA: Diagnosis not present

## 2018-01-14 DIAGNOSIS — J189 Pneumonia, unspecified organism: Secondary | ICD-10-CM

## 2018-01-14 DIAGNOSIS — A419 Sepsis, unspecified organism: Secondary | ICD-10-CM

## 2018-01-15 DIAGNOSIS — A419 Sepsis, unspecified organism: Secondary | ICD-10-CM | POA: Diagnosis not present

## 2018-01-15 DIAGNOSIS — J189 Pneumonia, unspecified organism: Secondary | ICD-10-CM | POA: Diagnosis not present

## 2018-01-16 DIAGNOSIS — J189 Pneumonia, unspecified organism: Secondary | ICD-10-CM | POA: Diagnosis not present

## 2018-01-16 DIAGNOSIS — A419 Sepsis, unspecified organism: Secondary | ICD-10-CM | POA: Diagnosis not present

## 2018-01-17 DIAGNOSIS — A419 Sepsis, unspecified organism: Secondary | ICD-10-CM | POA: Diagnosis not present

## 2018-01-17 DIAGNOSIS — J189 Pneumonia, unspecified organism: Secondary | ICD-10-CM | POA: Diagnosis not present

## 2018-01-21 DIAGNOSIS — R509 Fever, unspecified: Secondary | ICD-10-CM

## 2018-01-21 DIAGNOSIS — J690 Pneumonitis due to inhalation of food and vomit: Secondary | ICD-10-CM | POA: Diagnosis not present

## 2018-01-21 DIAGNOSIS — I69391 Dysphagia following cerebral infarction: Secondary | ICD-10-CM | POA: Diagnosis not present

## 2018-01-22 DIAGNOSIS — J690 Pneumonitis due to inhalation of food and vomit: Secondary | ICD-10-CM | POA: Diagnosis not present

## 2018-01-22 DIAGNOSIS — I69391 Dysphagia following cerebral infarction: Secondary | ICD-10-CM | POA: Diagnosis not present

## 2018-01-22 DIAGNOSIS — R509 Fever, unspecified: Secondary | ICD-10-CM | POA: Diagnosis not present

## 2018-01-23 DIAGNOSIS — J690 Pneumonitis due to inhalation of food and vomit: Secondary | ICD-10-CM | POA: Diagnosis not present

## 2018-01-23 DIAGNOSIS — R509 Fever, unspecified: Secondary | ICD-10-CM | POA: Diagnosis not present

## 2018-01-23 DIAGNOSIS — I69391 Dysphagia following cerebral infarction: Secondary | ICD-10-CM | POA: Diagnosis not present

## 2018-01-24 DIAGNOSIS — R509 Fever, unspecified: Secondary | ICD-10-CM | POA: Diagnosis not present

## 2018-01-24 DIAGNOSIS — J690 Pneumonitis due to inhalation of food and vomit: Secondary | ICD-10-CM | POA: Diagnosis not present

## 2018-01-24 DIAGNOSIS — I69391 Dysphagia following cerebral infarction: Secondary | ICD-10-CM | POA: Diagnosis not present

## 2018-01-25 DIAGNOSIS — J690 Pneumonitis due to inhalation of food and vomit: Secondary | ICD-10-CM | POA: Diagnosis not present

## 2018-01-25 DIAGNOSIS — I69391 Dysphagia following cerebral infarction: Secondary | ICD-10-CM | POA: Diagnosis not present

## 2018-01-25 DIAGNOSIS — R509 Fever, unspecified: Secondary | ICD-10-CM | POA: Diagnosis not present

## 2018-01-26 DIAGNOSIS — I69391 Dysphagia following cerebral infarction: Secondary | ICD-10-CM | POA: Diagnosis not present

## 2018-01-26 DIAGNOSIS — R509 Fever, unspecified: Secondary | ICD-10-CM | POA: Diagnosis not present

## 2018-01-26 DIAGNOSIS — J690 Pneumonitis due to inhalation of food and vomit: Secondary | ICD-10-CM | POA: Diagnosis not present

## 2018-01-27 DIAGNOSIS — J690 Pneumonitis due to inhalation of food and vomit: Secondary | ICD-10-CM | POA: Diagnosis not present

## 2018-01-27 DIAGNOSIS — R509 Fever, unspecified: Secondary | ICD-10-CM | POA: Diagnosis not present

## 2018-01-27 DIAGNOSIS — I69391 Dysphagia following cerebral infarction: Secondary | ICD-10-CM | POA: Diagnosis not present

## 2018-02-11 DEATH — deceased
